# Patient Record
Sex: Male | Born: 1955
Health system: Southern US, Community
[De-identification: ages and names within clinical notes are randomized; demographics above are authoritative.]

## PROBLEM LIST (undated history)

## (undated) DIAGNOSIS — M75100 Unspecified rotator cuff tear or rupture of unspecified shoulder, not specified as traumatic: Secondary | ICD-10-CM

## (undated) DIAGNOSIS — D500 Iron deficiency anemia secondary to blood loss (chronic): Secondary | ICD-10-CM

## (undated) DIAGNOSIS — M419 Scoliosis, unspecified: Secondary | ICD-10-CM

## (undated) DIAGNOSIS — Z9109 Other allergy status, other than to drugs and biological substances: Secondary | ICD-10-CM

## (undated) DIAGNOSIS — K7460 Unspecified cirrhosis of liver: Principal | ICD-10-CM

## (undated) DIAGNOSIS — M1990 Unspecified osteoarthritis, unspecified site: Secondary | ICD-10-CM

## (undated) DIAGNOSIS — R569 Unspecified convulsions: Secondary | ICD-10-CM

## (undated) DIAGNOSIS — I8501 Esophageal varices with bleeding: Secondary | ICD-10-CM

## (undated) DIAGNOSIS — B182 Chronic viral hepatitis C: Principal | ICD-10-CM

## (undated) DIAGNOSIS — K3189 Other diseases of stomach and duodenum: Secondary | ICD-10-CM

## (undated) DIAGNOSIS — B1920 Unspecified viral hepatitis C without hepatic coma: Secondary | ICD-10-CM

## (undated) DIAGNOSIS — R296 Repeated falls: Secondary | ICD-10-CM

## (undated) DIAGNOSIS — G40909 Epilepsy, unspecified, not intractable, without status epilepticus: Secondary | ICD-10-CM

## (undated) DIAGNOSIS — K5790 Diverticulosis of intestine, part unspecified, without perforation or abscess without bleeding: Secondary | ICD-10-CM

## (undated) DIAGNOSIS — S0990XS Unspecified injury of head, sequela: Secondary | ICD-10-CM

## (undated) DIAGNOSIS — K766 Portal hypertension: Secondary | ICD-10-CM

## (undated) DIAGNOSIS — K760 Fatty (change of) liver, not elsewhere classified: Secondary | ICD-10-CM

## (undated) DIAGNOSIS — D126 Benign neoplasm of colon, unspecified: Secondary | ICD-10-CM

## (undated) DIAGNOSIS — W19XXXA Unspecified fall, initial encounter: Secondary | ICD-10-CM

## (undated) DIAGNOSIS — Z9289 Personal history of other medical treatment: Secondary | ICD-10-CM

## (undated) DIAGNOSIS — G44309 Post-traumatic headache, unspecified, not intractable: Secondary | ICD-10-CM

## (undated) HISTORY — DX: Chronic viral hepatitis C: B18.2

## (undated) HISTORY — DX: Unspecified rotator cuff tear or rupture of unspecified shoulder, not specified as traumatic: M75.100

## (undated) HISTORY — PX: FRACTURE SURGERY: SHX138

## (undated) HISTORY — DX: Fatty (change of) liver, not elsewhere classified: K76.0

## (undated) HISTORY — DX: Unspecified viral hepatitis C without hepatic coma: B19.20

## (undated) HISTORY — DX: Scoliosis, unspecified: M41.9

## (undated) HISTORY — DX: Epilepsy, unspecified, not intractable, without status epilepticus: G40.909

## (undated) HISTORY — DX: Unspecified cirrhosis of liver: K74.60

## (undated) HISTORY — DX: Unspecified convulsions: R56.9

## (undated) HISTORY — PX: ABDOMINAL SURGERY: SHX537

## (undated) HISTORY — DX: Diverticulosis of intestine, part unspecified, without perforation or abscess without bleeding: K57.90

## (undated) HISTORY — PX: RADIUS SYNOSTOSIS PROXIMAL RESECTION W/ ALLOGRAFT: SHX2298

---

## 1985-12-21 DIAGNOSIS — Z9289 Personal history of other medical treatment: Secondary | ICD-10-CM

## 1985-12-21 HISTORY — DX: Personal history of other medical treatment: Z92.89

## 1985-12-21 HISTORY — PX: ORIF ZYGOMATIC FRACTURE: SHX2134

## 1999-09-03 ENCOUNTER — Ambulatory Visit (HOSPITAL_BASED_OUTPATIENT_CLINIC_OR_DEPARTMENT_OTHER): Admission: RE | Admit: 1999-09-03 | Discharge: 1999-09-03 | Payer: Self-pay | Admitting: Orthopedic Surgery

## 2001-03-29 ENCOUNTER — Emergency Department (HOSPITAL_COMMUNITY): Admission: EM | Admit: 2001-03-29 | Discharge: 2001-03-29 | Payer: Self-pay | Admitting: Emergency Medicine

## 2001-03-29 ENCOUNTER — Encounter: Payer: Self-pay | Admitting: Emergency Medicine

## 2006-12-10 ENCOUNTER — Ambulatory Visit: Payer: Self-pay | Admitting: Internal Medicine

## 2006-12-21 DIAGNOSIS — K5790 Diverticulosis of intestine, part unspecified, without perforation or abscess without bleeding: Secondary | ICD-10-CM

## 2006-12-21 HISTORY — PX: COLONOSCOPY: SHX174

## 2006-12-21 HISTORY — DX: Diverticulosis of intestine, part unspecified, without perforation or abscess without bleeding: K57.90

## 2006-12-29 ENCOUNTER — Ambulatory Visit: Payer: Self-pay | Admitting: Internal Medicine

## 2007-01-07 ENCOUNTER — Ambulatory Visit: Payer: Self-pay | Admitting: Internal Medicine

## 2007-02-18 ENCOUNTER — Ambulatory Visit: Payer: Self-pay | Admitting: Internal Medicine

## 2007-02-28 ENCOUNTER — Ambulatory Visit: Payer: Self-pay | Admitting: Internal Medicine

## 2009-12-21 DIAGNOSIS — K760 Fatty (change of) liver, not elsewhere classified: Secondary | ICD-10-CM

## 2009-12-21 HISTORY — DX: Fatty (change of) liver, not elsewhere classified: K76.0

## 2010-06-04 ENCOUNTER — Encounter: Payer: Self-pay | Admitting: Internal Medicine

## 2010-06-04 ENCOUNTER — Encounter: Admission: RE | Admit: 2010-06-04 | Discharge: 2010-06-04 | Payer: Self-pay | Admitting: Internal Medicine

## 2010-06-26 ENCOUNTER — Other Ambulatory Visit: Admission: RE | Admit: 2010-06-26 | Discharge: 2010-06-26 | Payer: Self-pay | Admitting: Otolaryngology

## 2010-07-16 ENCOUNTER — Ambulatory Visit (HOSPITAL_COMMUNITY): Admission: RE | Admit: 2010-07-16 | Discharge: 2010-07-16 | Payer: Self-pay | Admitting: Otolaryngology

## 2010-07-28 ENCOUNTER — Encounter (INDEPENDENT_AMBULATORY_CARE_PROVIDER_SITE_OTHER): Payer: Self-pay | Admitting: *Deleted

## 2010-08-28 ENCOUNTER — Ambulatory Visit: Payer: Self-pay | Admitting: Gastroenterology

## 2010-10-03 ENCOUNTER — Ambulatory Visit (HOSPITAL_COMMUNITY)
Admission: RE | Admit: 2010-10-03 | Discharge: 2010-10-03 | Payer: Self-pay | Source: Home / Self Care | Admitting: Gastroenterology

## 2010-10-17 ENCOUNTER — Ambulatory Visit (HOSPITAL_COMMUNITY)
Admission: RE | Admit: 2010-10-17 | Discharge: 2010-10-18 | Payer: Self-pay | Source: Home / Self Care | Admitting: Otolaryngology

## 2010-10-17 ENCOUNTER — Encounter (INDEPENDENT_AMBULATORY_CARE_PROVIDER_SITE_OTHER): Payer: Self-pay | Admitting: Otolaryngology

## 2010-10-23 ENCOUNTER — Ambulatory Visit: Payer: Self-pay | Admitting: Gastroenterology

## 2010-11-27 ENCOUNTER — Ambulatory Visit: Payer: Self-pay | Admitting: Gastroenterology

## 2010-12-21 HISTORY — PX: THYROIDECTOMY, PARTIAL: SHX18

## 2011-01-20 NOTE — Procedures (Signed)
Summary: Colonoscopy: Diverticulosis   Colonoscopy  Procedure date:  02/28/2007  Findings:      Results: Diverticulosis. Location:  [facility].   Comments: 1) NO POLYPS OR CANCER SEEN 2) SIGMOID DIVERTICULOSIS 3) OTHERWISE OK 4) 8 MIN 22 SEC WITHDRAWAL 5) EXCELLENT PREP  Comments:      Repeat colonoscopy in 10 years.    Procedures Next Due Date:    Colonoscopy: 02/2017 [facility] Reports-CCC] Patient Name: Francisco [name], Francisco [name]. MRN:  Procedure Procedures: Colonoscopy CPT: [phone].  Personnel: Endoscopist: Iva Boop, MD, [facility].  Referred By: Jonita Albee, MD.  Exam Location: Exam performed in Outpatient Clinic. Outpatient  Patient Consent: Procedure, Alternatives, Risks and Benefits discussed, consent obtained, from patient. Consent was obtained by the RN.  Indications  Average Risk Screening Routine.  History  Current Medications: Patient is not currently taking Coumadin.  Allergies: No known allergies.  Pre-Exam Physical: Performed Feb 28, 2007. Cardio-pulmonary exam, Rectal exam, HEENT exam , Abdominal exam, Mental status exam WNL.  Comments: Pt. history reviewed/updated, physical exam performed prior to initiation of sedation? YES Exam Exam: Extent of exam reached: Cecum, extent intended: Cecum.  The cecum was identified by appendiceal orifice and IC valve. Patient position: on left side. Time to Cecum: 00:02:12. Time for Withdrawl: 00:08:22. Colon retroflexion performed. Images taken. ASA Classification: I. Tolerance: excellent.  Monitoring: Pulse and BP monitoring, Oximetry used. Supplemental O2 given.  Colon Prep Used MiraLax for colon prep. Prep results: excellent.  Sedation Meds: Patient assessed and found to be appropriate for moderate (conscious) sedation. Fentanyl 50 mcg. given IV. Versed 6 mg. given IV.  Findings - DIVERTICULOSIS: Sigmoid Colon. ICD9: Diverticulosis, Colon: 562.10.  - NORMAL EXAM: Cecum to Descending  Colon.  NORMAL EXAM: Rectum.   Assessment  Diagnoses: 562.10: Diverticulosis, Colon.   Comments: 1) NO POLYPS OR CANCER SEEN 2) SIGMOID DIVERTICULOSIS 3) OTHERWISE OK 4) 8 MIN 22 SEC WITHDRAWAL 5) EXCELLENT PREP Events  Unplanned Interventions: No intervention was required.  Plans Patient Education: Patient given standard instructions for: Diverticulosis. Patient instructed to get routine colonoscopy every 10 years.  Disposition: After procedure patient sent to recovery. After recovery patient sent home.  Scheduling/Referral: Follow-Up prn.   Appended Document: Colonoscopy: Diverticulosis CC:   Robert Bellow, MD  This report was created from the original endoscopy report, which was reviewed and signed by the above listed endoscopist.

## 2011-01-20 NOTE — Letter (Signed)
Summary: New Patient letter  [facility]  [address] [place], [place] [address]   Phone: [phone]  Fax: [phone]       07/28/2010 MRN: [id number]  [facility] [address] [place], [place]  [address]  Dear Francisco [name],  Welcome to the Gastroenterology Division at [facility].    You are scheduled to see Dr.  Leone Payor on 09-17-10 at 10:00a.m. on the 3rd floor at [facility], [address]  We ask that you try to arrive at our office 15 minutes prior to your appointment time to allow for check-in.  We would like you to complete the enclosed self-administered evaluation form prior to your visit and bring it with you on the day of your appointment.  We will review it with you.  Also, please bring a complete list of all your medications or, if you prefer, bring the medication bottles and we will list them.  Please bring your insurance card so that we may make a copy of it.  If your insurance requires a referral to see a specialist, please bring your referral form from your primary care physician.  Co-payments are due at the time of your visit and may be paid by cash, check or credit card.     Your office visit will consist of a consult with your physician (includes a physical exam), any laboratory testing he/she may order, scheduling of any necessary diagnostic testing (e.g. x-ray, ultrasound, CT-scan), and scheduling of a procedure (e.g. Endoscopy, Colonoscopy) if required.  Please allow enough time on your schedule to allow for any/all of these possibilities.    If you cannot keep your appointment, please call [phone] to cancel or reschedule prior to your appointment date.  This allows [name] the opportunity to schedule an appointment for another patient in need of care.  If you do not cancel or reschedule by 5 p.m. the business day prior to your appointment date, you will be charged a $50.00 late cancellation/no-show fee.    Thank you for  choosing [facility] for your medical needs.  We appreciate the opportunity to care for you.  Please visit [name] at our website  to learn more about our practice.                     Sincerely,                                                             The Gastroenterology Division

## 2011-03-04 LAB — PROTIME-INR: INR: 0.96 (ref 0.00–1.49)

## 2011-03-04 LAB — CBC
Platelets: 107 10*3/uL — ABNORMAL LOW (ref 150–400)
RBC: 4.84 MIL/uL (ref 4.22–5.81)
RDW: 12.6 % (ref 11.5–15.5)
WBC: 6.5 10*3/uL (ref 4.0–10.5)

## 2011-03-04 LAB — HEPATIC FUNCTION PANEL
ALT: 207 U/L — ABNORMAL HIGH (ref 0–53)
AST: 159 U/L — ABNORMAL HIGH (ref 0–37)
Albumin: 3.7 g/dL (ref 3.5–5.2)
Bilirubin, Direct: 0.2 mg/dL (ref 0.0–0.3)
Total Protein: 7.4 g/dL (ref 6.0–8.3)

## 2011-03-04 LAB — BASIC METABOLIC PANEL
BUN: 11 mg/dL (ref 6–23)
CO2: 25 mEq/L (ref 19–32)
Calcium: 10 mg/dL (ref 8.4–10.5)
Chloride: 107 mEq/L (ref 96–112)
Creatinine, Ser: 0.74 mg/dL (ref 0.4–1.5)
GFR calc Af Amer: >60 mL/min (ref >60)
GFR calc non Af Amer: >60 mL/min (ref >60)
Glucose, Bld: 95 mg/dL (ref 70–99)
Potassium: 4.2 mEq/L (ref 3.5–5.1)
Sodium: 137 mEq/L (ref 135–145)

## 2011-03-04 LAB — APTT: aPTT: 31 seconds (ref 24–37)

## 2011-03-04 LAB — SURGICAL PCR SCREEN: Staphylococcus aureus: NEGATIVE

## 2011-03-05 LAB — CBC
MCV: 96.7 fL (ref 78.0–100.0)
Platelets: 107 10*3/uL — ABNORMAL LOW (ref 150–400)
RBC: 4.52 MIL/uL (ref 4.22–5.81)
RDW: 12.4 % (ref 11.5–15.5)
WBC: 5.1 10*3/uL (ref 4.0–10.5)

## 2011-03-05 LAB — PROTIME-INR: Prothrombin Time: 13.4 seconds (ref 11.6–15.2)

## 2011-03-05 LAB — APTT: aPTT: 32 seconds (ref 24–37)

## 2011-03-07 LAB — CBC
HCT: 44.5 % (ref 39.0–52.0)
MCHC: 34.9 g/dL (ref 30.0–36.0)
MCHC: 35.3 g/dL (ref 30.0–36.0)
MCV: 98.3 fL (ref 78.0–100.0)
Platelets: 128 10*3/uL — ABNORMAL LOW (ref 150–400)
RDW: 12.6 % (ref 11.5–15.5)
RDW: 12.6 % (ref 11.5–15.5)
WBC: 6.6 10*3/uL (ref 4.0–10.5)

## 2011-03-07 LAB — HEPATIC FUNCTION PANEL
Albumin: 3.5 g/dL (ref 3.5–5.2)
Bilirubin, Direct: 0.3 mg/dL (ref 0.0–0.3)
Total Bilirubin: 0.7 mg/dL (ref 0.3–1.2)

## 2011-03-07 LAB — PROTIME-INR
INR: 1.01 (ref 0.00–1.49)
Prothrombin Time: 13.2 seconds (ref 11.6–15.2)

## 2011-03-07 LAB — SURGICAL PCR SCREEN
MRSA, PCR: NEGATIVE
Staphylococcus aureus: NEGATIVE

## 2011-05-08 NOTE — Assessment & Plan Note (Signed)
[facility]                             PULMONARY OFFICE NOTE   NAME:[name], [name]                   MRN:          [id number]  DATE:01/07/2007                            DOB:          [birth year]    PROBLEMS:  1. Episodic urticaria.  2. Allergic asthma.  3. Tobacco use.   HISTORY:  He has cut cigarette smoking down to about 3 cigarettes a day.  There have been no acute exacerbations as he returns today for allergy  testing.   MEDICATIONS:  No routine medications. He has albuterol and Atrovent used  rarely as rescue inhalers.   OBJECTIVE:  VITAL SIGNS: Weight 224 pounds, blood pressure 116/70, pulse  regular 84, room air saturation 94%.  GENERAL: He appears comfortable, but there is mild, bilateral expiratory  wheeze. Breathing is unlabored.  HEART: Sounds are regular without murmur.  There is no neck vein distension, adenopathy, or edema.   Spirometry before and after bronchodilator on January 9, showed mild  obstructive airways disease, primarily in small airway flows which were  between 27-37% of predicted with a 37% improvement after bronchodilator.  Chest x-ray on December 21 had shown chronic obstructive pulmonary  disease and probable chronic bronchitic changes with no active lung  disease. His measured immunoglobulin E-level was markedly elevated at  681.8. His CBC was remarkable for an elevated eosinophil count at 9.4%.   SKIN TESTING:  Positive histamine, negative diluent control. Significant  positive reactions primarily for [place] grass, several fall weeds, some  trees, house dust, and dust mite, and also to cat. As of recently, he no  longer has a cat.   IMPRESSION:  1. Chronic asthma with a significant allergic component.  2. History of urticaria.   PLAN:  1. Smoking cessation was re-emphasized and encouraged.  2. Environmental precautions, house dust management, use of air      cleaners and encasings, were all  reviewed. For the rest of this      winter he is going to using Claritin p.r.n., along with p.r.n. use      of his inhalers. I am going to see him back in the spring, in about      4 months, because I want to see what effect the season change and      spring pollens have on him. We      have begun the discussion of the options of allergy vaccine      management and the likelihood that he is going to need a steroid      inhaler. He will contact us earlier p.r.n.     Clinton D. Maple Hudson, MD, Ton[facility] Bollman, FACP  Electronically Signed    CDY/MedQ  DD: 01/07/2007  DT: 01/07/2007  Job #: [id number]   cc:   Rodolph Bong, M.D.

## 2011-05-08 NOTE — Assessment & Plan Note (Signed)
[facility]                             PULMONARY OFFICE NOTE   NAME:[name], [name]                   MRN:          [id number]  DATE:12/10/2006                            DOB:          1956/06/24    PROBLEM:  This 55 year old man was referred through the courtesy of Rodolph Bong, M.D. at [facility] for complaints of  trouble breathing.   HISTORY:  He has been aware of shortness of breath since mid-November of  this fall with three separate episodes in the past 8 months.  He first  began noticing problems about a year ago when he held his daughter's  [place] pig.  His eyes began to water.  On another occasion, his lips  swelled, he got a rash on his hands, and his breathing felt tight.  These episodes have come and gone.  [facility] and  November.  He has had at least three episodes of angioedema of the lip.  He was treated with albuterol and Atrovent as inhalers and by  description probably got some antihistamines as well, although he is not  clear about his medicines.  He does not distinguish events and  circumstances very clearly, but it does not sound as if each of these  episodes was associated with direct contact with the [place] pig which is  in a study in their home where the family computer is kept.  He has not  had a history of asthma, but as a small child he remembers that he was  told he was allergic to a feather pillow.   MEDICATIONS:  Albuterol and Atrovent inhalers.   ALLERGIES:  No known drug allergies.   REVIEW OF SYSTEMS:  Shortness of breath at rest, productive cough,  sometimes dry cough.  Otherwise as per HPI.  He denies adenopathy,  sustained wheezing or coughing, reflux, nausea or vomiting.   PAST MEDICAL HISTORY:  Good general health.  Surgical reconstruction of  his right zygomatic arch and of a fracture of the right forearm.  No  history of intolerance to latex, IV contrast,  iodine, or aspirin.   SOCIAL HISTORY:  He smokes 1/2 pack per day and drinks two alcoholic  beverages per day.  He is divorced with 83 year old daughter who lives  with him.  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  In  his current home, they did have a cat and dog, each of which recently  died of old age.  He is not recognizing a significant mold or mildew  problem or obvious aggravating factors in his workplace or home.   FAMILY HISTORY:  Both parents are living.  Brother has pollen allergy  problems, died in a motor vehicle accident.   OBJECTIVE:  VITAL SIGNS:  Weight 225 pounds, blood pressure 118/82,  pulse regular 69, room air saturation 97%.  SKIN:  Clear.  HEENT:  Conjunctivae are  not injected.  There is some wet mucous in both  nares without obstruction.  His pharynx is clear.  Neck veins are not  distended.  There is no stridor.  HEART:  Sounds are regular without murmur or gallop.  CHEST:  Mildly congested, evident only when he coughed.  There was no  wheeze or abnormal noise with quiet breathing.  No dullness.  EXTREMITIES:  Without cyanosis, clubbing, or edema.   IMPRESSION:  1. Episodic urticaria.  2. Allergic asthma.  3. Tobacco use.   PLAN:  1. He has Chantix which he had not yet started.  He is encouraged to      do so.  2. I suggested he try boarding the [place] pig for a while to get it      out of the house for a month or so.  3. We are scheduling pulmonary function tests.  4. Chest x-ray.  5. CBC with differential and IgE level.  6. Schedule return for allergy testing.  7. We discussed p.r.n. use of antihistamines and albuterol.   I appreciate the chance to meet him.     Clinton D. Maple Hudson, MD, Tonny Bollman, FACP  Electronically Signed    CDY/MedQ  DD: 12/15/2006  DT: 12/15/2006  Job #: [id number]   cc:    Rodolph Bong, M.D.

## 2013-01-03 ENCOUNTER — Ambulatory Visit (INDEPENDENT_AMBULATORY_CARE_PROVIDER_SITE_OTHER): Payer: BC Managed Care – PPO | Admitting: Physician Assistant

## 2013-01-03 VITALS — BP 130/84 | HR 121 | Temp 98.2°F | Resp 18 | Ht 72.0 in | Wt 239.0 lb

## 2013-01-03 DIAGNOSIS — L509 Urticaria, unspecified: Secondary | ICD-10-CM

## 2013-01-03 MED ORDER — HYDROXYZINE HCL 25 MG PO TABS: 12.5000 mg | ORAL_TABLET | Freq: Three times a day (TID) | ORAL | Status: DC | PRN

## 2013-01-03 MED ORDER — METHYLPREDNISOLONE ACETATE 80 MG/ML IJ SUSP
80.0000 mg | Freq: Once | INTRAMUSCULAR | Status: AC
  Administered 2013-01-03: 80 mg via INTRAMUSCULAR

## 2013-01-03 NOTE — Progress Notes (Signed)
Subjective:    Patient ID: Francisco [name], male    DOB: 23-Feb-1956, 57 y.o.   MRN: [id number]  HPI   Francisco [name] is a 57 yr old male here with concern for a food allergy.  States he ate at a [employer] yesterday for dinner.  About 10 minutes after leaving the restaurant he developed a rash on his arm.  Rash and hives subsequently developed all over his body - predominantly across buttocks, abdomen, arms.  The hives are very itchy.  No fever or chills.  No GI symptoms.  Denies respiratory symptoms, itchy mouth, swelling lips or tongue.  Does state that his top lip has felt a little numb but that this is resolving.  He has never had a rash like this before.  He was eating by himself, no other cross contamination that he is aware of.  Pt states he ate a burrito with chicken, pico de gallo, beans, and onions.  He has eaten this before without issue.  He also had a margarita with his meal.  No other new exposures yesterday.  Had a reuben sandwich and fries for lunch, but also has eaten this without issue before.  No food allergies that he is aware of.  His only known allergy is to [place] pigs - had reaction to daughter's pet [place] pig.  States the thought did cross his mind that perhaps he was not eating chicken in his burrito.  States he tolerates common food allergens like nuts and shellfish.  Used some old cortisone cream topically last night which allowed him to get some sleep.  Has not used an antihistamine yet.   Review of Systems  Constitutional: Negative for fever and chills.  HENT: Negative for facial swelling.   Eyes: Negative.   Respiratory: Negative for cough, chest tightness, shortness of breath and wheezing.   Cardiovascular: Negative.   Gastrointestinal: Negative for nausea, vomiting, abdominal pain and diarrhea.  Musculoskeletal: Negative.   Skin: Positive for rash.  Neurological: Negative.        Objective:   Physical Exam  Vitals reviewed. Constitutional: He  is oriented to person, place, and time. He appears well-developed and well-nourished. No distress.  HENT:  Head: Normocephalic and atraumatic.  Eyes: Conjunctivae normal are normal. No scleral icterus.  Cardiovascular: Normal rate, regular rhythm, normal heart sounds and intact distal pulses.  Exam reveals no gallop and no friction rub.   No murmur heard. Pulmonary/Chest: Effort normal and breath sounds normal. He has no wheezes. He has no rales.  Abdominal: Soft. Bowel sounds are normal. There is no tenderness.  Neurological: He is alert and oriented to person, place, and time.  Skin: Skin is warm and dry. Rash noted. Rash is urticarial. Rash is not pustular and not vesicular.          Diffuse urticarial rash with most predominant areas across buttocks  Psychiatric: He has a normal mood and affect. His behavior is normal.     Filed Vitals:   01/03/13 0849  BP: 130/84  Pulse: 121  Temp: 98.2 F (36.8 C)  Resp: 18   On exam pulse regular at approx 80 bpm     Assessment & Plan:  . 1. Urticaria  methylPREDNISolone acetate (DEPO-MEDROL) injection 80 mg, hydrOXYzine (ATARAX/VISTARIL) 25 MG tablet, Ambulatory referral to Allergy     [name] is a very pleasant 57 yr old male with an urticarial eruption, suspicious for food allergy given temporal relationship to a meal.  Unclear what  the triggering exposure was.  There are no new exposures that the patient is aware of.  He has no history of food allergy.  Pt was initially resistant to the idea of seeing an allergist, but I discussed with him that it is important for [name] to know what caused this reaction.  I expressed concern that a subsequent encounter with the allergen could result in a more severe reaction . Pt eventually agreed that allergy evaluation may be beneficial, so I have placed this referral.  Pt given IM depomedrol in clinic.  Also given 10mg  loratadine.  Encouraged pt to use OTC cetirizine during the day for itching and  hydroxyzine at night.  Discussed RTC precautions.  Pt understands and will let is know if he is worsening or not improving.

## 2013-01-03 NOTE — Patient Instructions (Addendum)
We have given you a shot of steroids today, this will help reduce the itching over the next several days.  Take Zyrtec (cetirizine) once daily for itching.  Take hydroxyzine at night if needed for itching - this medicine will make you sleepy!  I have put in a referral to allergy so that we can hopefully pinpoint what you are allergy to so you can avoid this in the future.   Food Allergy A food allergy occurs from eating something you are sensitive to. Food allergies occur in all age groups. It may be passed to you from your parents (heredity).  CAUSES  Some common causes are cow's milk, seafood, eggs, nuts (including peanut butter), wheat, and soybeans. SYMPTOMS  Common problems are:   Swelling around the mouth.  An itchy, red rash.  Hives.  Vomiting.  Diarrhea. Severe allergic reactions are life-threatening. This reaction is called anaphylaxis. It can cause the mouth and throat to swell. This makes it hard to breathe and swallow. In severe reactions, only a small amount of food may be fatal within seconds. HOME CARE INSTRUCTIONS   If you are unsure what caused the reaction, keep a diary of foods eaten and symptoms that followed. Avoid foods that cause reactions.  If hives or rash are present:  Take medicines as directed.  Use an over-the-counter antihistamine (diphenhydramine) to treat hives and itching as needed.  Apply cold compresses to the skin or take baths in cool water. Avoid hot baths or showers. These will increase the redness and itching.  If you are severely allergic:  Hospitalization is often required following a severe reaction.  Wear a medical alert bracelet or necklace that describes the allergy.  Carry your anaphylaxis kit or epinephrine injection with you at all times. Both you and your family members should know how to use this. This can be lifesaving if you have a severe reaction. If epinephrine is used, it is important for you to seek immediate medical care  or call your local emergency services (911 in [place] When the epinephrine wears off, it can be followed by a delayed reaction, which can be fatal.  Replace your epinephrine immediately after use in case of another reaction.  Ask your caregiver for instructions if you have not been taught how to use an epinephrine injection.  Do not drive until medicines used to treat the reaction have worn off, unless approved by your caregiver. SEEK MEDICAL CARE IF:   You suspect a food allergy. Symptoms generally happen within 30 minutes of eating a food.  Your symptoms have not gone away within 2 days. See your caregiver sooner if symptoms are getting worse.  You develop new symptoms.  You want to retest yourself with a food or drink you think causes an allergic reaction. Never do this if an anaphylactic reaction to that food or drink has happened before.  There is a return of the symptoms which brought you to your caregiver. SEEK IMMEDIATE MEDICAL CARE IF:   You have trouble breathing, are wheezing, or you have a tight feeling in your chest or throat.  You have a swollen mouth, or you have hives, swelling, or itching all over your body. Use your epinephrine injection immediately. This is given into the outside of your thigh, deep into the muscle. Following use of the epinephrine injection, seek help right away. Seek immediate medical care or call your local emergency services (911 in [place] MAKE SURE YOU:   Understand these instructions.  Will watch your  condition.  Will get help right away if you are not doing well or get worse. Document Released: 12/04/2000 Document Revised: 02/29/2012 Document Reviewed: 07/26/2008 [facility] Patient Information 2013 [place], [place].

## 2014-05-08 ENCOUNTER — Encounter: Payer: Self-pay | Admitting: Internal Medicine

## 2014-05-08 ENCOUNTER — Ambulatory Visit (INDEPENDENT_AMBULATORY_CARE_PROVIDER_SITE_OTHER): Payer: BC Managed Care – PPO | Admitting: Emergency Medicine

## 2014-05-08 VITALS — BP 118/75 | HR 77 | Temp 97.9°F | Resp 18 | Ht 72.0 in | Wt 229.2 lb

## 2014-05-08 DIAGNOSIS — K625 Hemorrhage of anus and rectum: Secondary | ICD-10-CM

## 2014-05-08 LAB — POCT CBC
Granulocyte percent: 56.4 %G (ref 37–80)
HCT, POC: 36 % — AB (ref 43.5–53.7)
Hemoglobin: 11.6 g/dL — AB (ref 14.1–18.1)
LYMPH, POC: 1.4 (ref 0.6–3.4)
MCH: 33 pg — AB (ref 27–31.2)
MCHC: 32.2 g/dL (ref 31.8–35.4)
MCV: 102.7 fL — AB (ref 80–97)
MID (CBC): 0.2 (ref 0–0.9)
MPV: 8.1 fL (ref 0–99.8)
PLATELET COUNT, POC: 0.74 10*3/uL — AB (ref 142–424)
POC Granulocyte: 2.1 (ref 2–6.9)
POC LYMPH %: 37.7 % (ref 10–50)
POC MID %: 5.9 % (ref 0–12)
RBC: 3.51 M/uL — AB (ref 4.69–6.13)
RDW, POC: 14.7 %
WBC: 3.7 10*3/uL — AB (ref 4.6–10.2)

## 2014-05-08 LAB — IFOBT (OCCULT BLOOD): IFOBT: POSITIVE

## 2014-05-08 MED ORDER — ESOMEPRAZOLE MAGNESIUM 40 MG PO CPDR: 40.0000 mg | DELAYED_RELEASE_CAPSULE | Freq: Every day | ORAL | Status: DC

## 2014-05-08 MED ORDER — SUCRALFATE 1 G PO TABS: ORAL_TABLET | ORAL | Status: DC

## 2014-05-08 NOTE — Patient Instructions (Signed)
Gastrointestinal Bleeding °Gastrointestinal (GI) bleeding means there is bleeding somewhere along the digestive tract, between the mouth and anus. °CAUSES  °There are many different problems that can cause GI bleeding. Possible causes include: °· Esophagitis. This is inflammation, irritation, or swelling of the esophagus. °· Hemorrhoids. These are veins that are full of blood (engorged) in the rectum. They cause pain, inflammation, and may bleed. °· Anal fissures. These are areas of painful tearing which may bleed. They are often caused by passing hard stool. °· Diverticulosis. These are pouches that form on the colon over time, with age, and may bleed significantly. °· Diverticulitis. This is inflammation in areas with diverticulosis. It can cause pain, fever, and bloody stools, although bleeding is rare. °· Polyps and cancer. Colon cancer often starts out as precancerous polyps. °· Gastritis and ulcers. Bleeding from the upper gastrointestinal tract (near the stomach) may travel through the intestines and produce black, sometimes tarry, often bad smelling stools. In certain cases, if the bleeding is fast enough, the stools may not be black, but red. This condition may be life-threatening. °SYMPTOMS  °· Vomiting bright red blood or material that looks like coffee grounds. °· Bloody, black, or tarry stools. °DIAGNOSIS  °Your caregiver may diagnose your condition by taking your history and performing a physical exam. More tests may be needed, including: °· X-rays and other imaging tests. °· Esophagogastroduodenoscopy (EGD). This test uses a flexible, lighted tube to look at your esophagus, stomach, and small intestine. °· Colonoscopy. This test uses a flexible, lighted tube to look at your colon. °TREATMENT  °Treatment depends on the cause of your bleeding.  °· For bleeding from the esophagus, stomach, small intestine, or colon, the caregiver doing your EGD or colonoscopy may be able to stop the bleeding as part of  the procedure. °· Inflammation or infection of the colon can be treated with medicines. °· Many rectal problems can be treated with creams, suppositories, or warm baths. °· Surgery is sometimes needed. °· Blood transfusions are sometimes needed if you have lost a lot of blood. °If bleeding is slow, you may be allowed to go home. If there is a lot of bleeding, you will need to stay in the hospital for observation. °HOME CARE INSTRUCTIONS  °· Take any medicines exactly as prescribed. °· Keep your stools soft by eating foods that are high in fiber. These foods include whole grains, legumes, fruits, and vegetables. Prunes (1 to 3 a day) work well for many people. °· Drink enough fluids to keep your urine clear or pale yellow. °SEEK IMMEDIATE MEDICAL CARE IF:  °· Your bleeding increases. °· You feel lightheaded, weak, or you faint. °· You have severe cramps in your back or abdomen. °· You pass large blood clots in your stool. °· Your problems are getting worse. °MAKE SURE YOU:  °· Understand these instructions. °· Will watch your condition. °· Will get help right away if you are not doing well or get worse. °Document Released: 12/04/2000 Document Revised: 11/23/2012 Document Reviewed: 11/16/2011 °[employer]® Patient Information ©2014 [employer]. ° °

## 2014-05-08 NOTE — Progress Notes (Signed)
[facility] [address], [place] [address] [phone]  Date:  05/08/2014   Name:  Francisco [name]   DOB:  1956-08-24   MRN:  [id number]  PCP:  No PCP Per Patient    Chief Complaint: Rectal Bleeding   History of Present Illness:  Francisco [name] is a 58 y.o. very pleasant male patient who presents with the following:  Says has experienced black stools intermittently.  Initially a year ago and more recently since Saturday.  No heartburn or indigestion.  Feels as though he has to belch and is forced to get up at night and walk around to belch. No excess alcohol, caffeine, or NSAID or ASA.  No history of PUD of gastritis.  Smokes 1/2 ppd.  No abdominal pain.  History of transaminitis and "normal" liver biopsy per patient.  Underwent a colonoscopy at age 27 which was clean.  No nausea or vomiting.  No hematemesis.   No BRBPR.   No improvement with over the counter medications or other home remedies. Denies other complaint or health concern today.   There are no active problems to display for this patient.   Past Medical History  Diagnosis Date  . Allergy     Past Surgical History  Procedure Laterality Date  . Fracture surgery    . Thyroidectomy  2012    History  Substance Use Topics  . Smoking status: Current Every Day Smoker -- 0.50 packs/day    Types: Cigarettes    Start date: 05/08/1974  . Smokeless tobacco: Not on file  . Alcohol Use: 6.0 oz/week    10 Cans of beer per week    No family history on file.  No Known Allergies  Medication list has been reviewed and updated.  No current outpatient prescriptions on file prior to visit.   No current facility-administered medications on file prior to visit.    Review of Systems:  As per HPI, otherwise negative.    Physical Examination: Filed Vitals:   05/08/14 1231  BP: 118/75  Pulse: 77  Temp: 97.9 F (36.6 C)  Resp: 18   Filed Vitals:   05/08/14 1231  Height: 6' (1.829  m)  Weight: 229 lb 3.2 oz (103.964 kg)   Body mass index is 31.08 kg/(m^2). Ideal Body Weight: Weight in (lb) to have BMI = 25: 183.9  GEN: WDWN, NAD, Non-toxic, A & O x 3 HEENT: Atraumatic, Normocephalic. Neck supple. No masses, No LAD. Ears and Nose: No external deformity. CV: RRR, No M/G/R. No JVD. No thrill. No extra heart sounds. PULM: CTA B, no wheezes, crackles, rhonchi. No retractions. No resp. distress. No accessory muscle use. ABD: S, epigastric tenderness, umbilical hernia, ND, +BS. No rebound. No HSM. EXTR: No c/c/e NEURO Normal gait.  PSYCH: Normally interactive. Conversant. Not depressed or anxious appearing.  Calm demeanor.  DRE:  Large prostate  Black stool  Assessment and Plan: GI bleed.  Presume PUD vs GERD Hemodynamically stable GI consult nexium carafate  Signed,  Ellison Carwin, MD    Results for orders placed in visit on 05/08/14  POCT CBC      Result Value Ref Range   WBC 3.7 (*) 4.6 - 10.2 K/uL   Lymph, poc 1.4  0.6 - 3.4   POC LYMPH PERCENT 37.7  10 - 50 %L   MID (cbc) 0.2  0 - 0.9   POC MID % 5.9  0 - 12 %M   POC Granulocyte 2.1  2 - 6.9   Granulocyte percent 56.4  37 - 80 %G   RBC 3.51 (*) 4.69 - 6.13 M/uL   Hemoglobin 11.6 (*) 14.1 - 18.1 g/dL   HCT, POC 36.0 (*) 43.5 - 53.7 %   MCV 102.7 (*) 80 - 97 fL   MCH, POC 33.0 (*) 27 - 31.2 pg   MCHC 32.2  31.8 - 35.4 g/dL   RDW, POC 14.7     Platelet Count, POC 0.74 (*) 142 - 424 K/uL   MPV 8.1  0 - 99.8 fL  IFOBT (OCCULT BLOOD)      Result Value Ref Range   IFOBT Positive

## 2014-05-09 LAB — COMPREHENSIVE METABOLIC PANEL
ALBUMIN: 3.5 g/dL (ref 3.5–5.2)
ALK PHOS: 140 U/L — AB (ref 39–117)
ALT: 136 U/L — ABNORMAL HIGH (ref 0–53)
AST: 121 U/L — AB (ref 0–37)
BUN: 12 mg/dL (ref 6–23)
CO2: 23 mEq/L (ref 19–32)
CREATININE: 0.66 mg/dL (ref 0.50–1.35)
Calcium: 9.5 mg/dL (ref 8.4–10.5)
Chloride: 104 mEq/L (ref 96–112)
GLUCOSE: 107 mg/dL — AB (ref 70–99)
Potassium: 3.8 mEq/L (ref 3.5–5.3)
Sodium: 137 mEq/L (ref 135–145)
Total Bilirubin: 1.1 mg/dL (ref 0.2–1.2)
Total Protein: 7.5 g/dL (ref 6.0–8.3)

## 2014-05-09 LAB — H. PYLORI ANTIBODY, IGG: H Pylori IgG: <0.4 {ISR}

## 2014-07-05 ENCOUNTER — Encounter: Payer: Self-pay | Admitting: Gastroenterology

## 2014-07-16 ENCOUNTER — Encounter: Payer: Self-pay | Admitting: Internal Medicine

## 2014-07-16 ENCOUNTER — Ambulatory Visit (INDEPENDENT_AMBULATORY_CARE_PROVIDER_SITE_OTHER): Payer: BC Managed Care – PPO | Admitting: Internal Medicine

## 2014-07-16 ENCOUNTER — Other Ambulatory Visit: Payer: BC Managed Care – PPO

## 2014-07-16 ENCOUNTER — Other Ambulatory Visit (INDEPENDENT_AMBULATORY_CARE_PROVIDER_SITE_OTHER): Payer: BC Managed Care – PPO

## 2014-07-16 VITALS — BP 100/60 | HR 68 | Ht 71.0 in | Wt 238.5 lb

## 2014-07-16 DIAGNOSIS — K921 Melena: Secondary | ICD-10-CM

## 2014-07-16 DIAGNOSIS — K7400 Hepatic fibrosis, unspecified: Secondary | ICD-10-CM

## 2014-07-16 DIAGNOSIS — B182 Chronic viral hepatitis C: Secondary | ICD-10-CM

## 2014-07-16 DIAGNOSIS — K7460 Unspecified cirrhosis of liver: Secondary | ICD-10-CM

## 2014-07-16 DIAGNOSIS — D649 Anemia, unspecified: Secondary | ICD-10-CM

## 2014-07-16 LAB — PROTIME-INR
INR: 1.1 ratio — ABNORMAL HIGH (ref 0.8–1.0)
Prothrombin Time: 12.2 s (ref 9.6–13.1)

## 2014-07-16 LAB — COMPREHENSIVE METABOLIC PANEL
ALT: 50 U/L (ref 0–53)
AST: 50 U/L — AB (ref 0–37)
Albumin: 3.1 g/dL — ABNORMAL LOW (ref 3.5–5.2)
Alkaline Phosphatase: 133 U/L — ABNORMAL HIGH (ref 39–117)
BUN: 10 mg/dL (ref 6–23)
CALCIUM: 9.3 mg/dL (ref 8.4–10.5)
CHLORIDE: 109 meq/L (ref 96–112)
CO2: 27 mEq/L (ref 19–32)
CREATININE: 0.7 mg/dL (ref 0.4–1.5)
GFR: 119.02 mL/min (ref >60.00)
Glucose, Bld: 92 mg/dL (ref 70–99)
Potassium: 4.2 mEq/L (ref 3.5–5.1)
Sodium: 138 mEq/L (ref 135–145)
Total Bilirubin: 0.7 mg/dL (ref 0.2–1.2)
Total Protein: 7.3 g/dL (ref 6.0–8.3)

## 2014-07-16 LAB — CBC WITH DIFFERENTIAL/PLATELET
BASOS ABS: 0 10*3/uL (ref 0.0–0.1)
Basophils Relative: 0.4 % (ref 0.0–3.0)
Eosinophils Absolute: 0 10*3/uL (ref 0.0–0.7)
Eosinophils Relative: 1.4 % (ref 0.0–5.0)
HCT: 26.5 % — ABNORMAL LOW (ref 39.0–52.0)
Lymphocytes Relative: 35.5 % (ref 12.0–46.0)
Lymphs Abs: 1 10*3/uL (ref 0.7–4.0)
MCHC: 33.3 g/dL (ref 30.0–36.0)
MCV: 82.5 fl (ref 78.0–100.0)
Monocytes Absolute: 0.3 10*3/uL (ref 0.1–1.0)
Monocytes Relative: 9.8 % (ref 3.0–12.0)
NEUTROS ABS: 1.5 10*3/uL (ref 1.4–7.7)
Neutrophils Relative %: 52.9 % (ref 43.0–77.0)
Platelets: 111 10*3/uL — ABNORMAL LOW (ref 150.0–400.0)
RBC: 3.21 Mil/uL — AB (ref 4.22–5.81)
RDW: 19.3 % — ABNORMAL HIGH (ref 11.5–15.5)
WBC: 2.9 10*3/uL — AB (ref 4.0–10.5)

## 2014-07-16 LAB — VITAMIN B12: Vitamin B-12: 324 pg/mL (ref 211–911)

## 2014-07-16 LAB — FERRITIN: Ferritin: 12 ng/mL — ABNORMAL LOW (ref 22.0–322.0)

## 2014-07-16 NOTE — Progress Notes (Signed)
Subjective:    Patient ID: Francisco [name], male    DOB: 24-Sep-1956, 58 y.o.   MRN: [id number]  HPI  The patient is here to request of Dr. Annye Asa for evaluation of 1. He was seen in late may have a hemoglobin of 11.6 which is down from a normal hemoglobin a couple of years ago. He had an episode of melena this had once since. One time he vomited dark material. He has not felt unwell otherwise though is complaining of some peripheral edema. He was on some Carafate and is now on Nexium. He has a history of hepatitis C with a liver biopsy showing fibrosis in 2011, he saw the [facility] hepatologist but it does not sound like treatment was offered and I don't really have those records. He believes his problem was from fatty liver. He has just returned from a nice trip to the [facility] with his adult daughter. His GI review of systems is otherwise negative at this time  No Known Allergies Outpatient Prescriptions Prior to Visit  Medication Sig Dispense Refill  . esomeprazole (NEXIUM) 40 MG capsule Take 1 capsule (40 mg total) by mouth daily.  30 capsule  3  . sucralfate (CARAFATE) 1 G tablet 1 tab 1 h ac and hs  120 tablet  0   No facility-administered medications prior to visit.   Past Medical History  Diagnosis Date  . Allergy   . Fatty liver 2011    Abd [name]  . Diverticulosis 2008  . Hepatitis C 10/03/2010    surgical path  . Liver fibrosis 10/03/2010    surgical path   Past Surgical History  Procedure Laterality Date  . Orif zygomatic fracture Right   . Thyroidectomy  2012  . Colonoscopy  2008  . Radius synostosis proximal resection w/ allograft      at the elbow   History   Social History  . Marital Status: Divorced    Spouse Name: N/A    Number of Children: 1  . Years of Education: N/A   Occupational History  . operations officer     [employer]   Social History Main Topics  . Smoking status: Current Every Day Smoker -- 0.50 packs/day    Types:  Cigarettes    Start date: 05/08/1974  . Smokeless tobacco: Never Used  . Alcohol Use: 6.0 oz/week    10 Cans of beer per week  . Drug Use: No  . Sexual Activity: None   Other Topics Concern  . 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 He has about 4 beers per day and 2 caffeinated beverages per day.   Family History  Problem Relation Age of Onset  . Crohn's disease Father   . Heart disease Maternal Grandmother   . Alcohol abuse Maternal Grandfather   . Alcohol abuse Maternal Uncle     several  . Cancer Paternal Grandfather     type unknown, mets       Review of Systems Positive for anxiety fatigue insomnia and pedal edema. All other review of systems are negative.    Objective:   Physical Exam General:  Well-developed, well-nourished and in no acute distress Eyes:  anicteric. ENT:   Mouth and posterior pharynx free of lesions.  Neck:   supple w/o thyromegaly or mass.  Lungs: Clear to auscultation bilaterally. Heart:  S1S2, no rubs, murmurs, gallops. Abdomen:  soft, non-tender, no plenomegaly, positive small reducible umbilical hernia, or mass and BS+. I think the left lobe of the liver is palpable near the epigastrium Rectal: Deferred Lymph:  no cervical or supraclavicular adenopathy. Extremities:   1+ pretibial, mid leg edema bilateral, brown hyperpigmentation Skin  there is a rash in the intertriginous area of the lower abdomen, some brown hyperpigmentation and red maculopapular . He does have some spider angiomata on the upper back and upper chest. Neuro:  A&O x 3.  Psych:  appropriate mood and  Affect.   Data Reviewed: Colonoscopy from 2008 is reviewed, may 2015 primary care note labs reviewed in the EMR       Assessment & Plan:   1. Melena   2. Anemia, unspecified anemia type   3. Liver fibrosis   4. Chronic hepatitis C without hepatic  coma      He's had melena as I FOBT was also positive. He has thrombocytopenia, history of liver fibrosis and hepatitis C, spider angiomata. I suspect he may have cirrhosis of the liver and portal hypertension at this point. He needs an upper GI endoscopy and a colonoscopy to investigate his problems. We'll also check a metabolic panel, CBC, coags and repeat hepatitis C RNA. Further plans pending this.  Also uses alcohol regularly about 4 beers a day. This is probably part of his liver problem as well. Await other testing, suspect she will need to become abstinent. The risks and benefits as well as alternatives of endoscopic procedure(s) have been discussed and reviewed. All questions answered. The patient agrees to proceed.

## 2014-07-16 NOTE — Patient Instructions (Addendum)
You have been scheduled for an endoscopy(colon & EGD) at [facility] Please follow written instructions given to you at your visit today. If you use inhalers (even only as needed), please bring them with you on the day of your procedure.   Your physician has requested that you go to the basement for lab work before leaving today.  I appreciate the opportunity to care for you.

## 2014-07-18 ENCOUNTER — Encounter (HOSPITAL_COMMUNITY): Payer: Self-pay | Admitting: Pharmacy Technician

## 2014-07-18 LAB — HEPATITIS C RNA QUANTITATIVE
HCV Quantitative Log: 6.08 {Log} — ABNORMAL HIGH (ref <1.18)
HCV Quantitative: 1202466 IU/mL — ABNORMAL HIGH (ref <15)

## 2014-07-19 ENCOUNTER — Encounter (HOSPITAL_COMMUNITY): Payer: Self-pay | Admitting: *Deleted

## 2014-07-19 ENCOUNTER — Encounter (HOSPITAL_COMMUNITY)
Admission: RE | Disposition: A | Discharge status: Home / Self Care | Payer: Self-pay | Source: Ambulatory Visit | Attending: Internal Medicine

## 2014-07-19 ENCOUNTER — Ambulatory Visit (HOSPITAL_COMMUNITY)
Admission: RE | Admit: 2014-07-19 | Discharge: 2014-07-19 | Disposition: A | Discharge status: Home / Self Care | Payer: BC Managed Care – PPO | Source: Ambulatory Visit | Attending: Internal Medicine | Admitting: Internal Medicine

## 2014-07-19 DIAGNOSIS — I8501 Esophageal varices with bleeding: Secondary | ICD-10-CM

## 2014-07-19 DIAGNOSIS — K921 Melena: Secondary | ICD-10-CM | POA: Insufficient documentation

## 2014-07-19 DIAGNOSIS — Z860100 Personal history of colon polyps, unspecified: Secondary | ICD-10-CM | POA: Diagnosis present

## 2014-07-19 DIAGNOSIS — D126 Benign neoplasm of colon, unspecified: Secondary | ICD-10-CM

## 2014-07-19 DIAGNOSIS — I8500 Esophageal varices without bleeding: Secondary | ICD-10-CM | POA: Insufficient documentation

## 2014-07-19 DIAGNOSIS — K766 Portal hypertension: Secondary | ICD-10-CM | POA: Insufficient documentation

## 2014-07-19 DIAGNOSIS — D696 Thrombocytopenia, unspecified: Secondary | ICD-10-CM | POA: Insufficient documentation

## 2014-07-19 DIAGNOSIS — D509 Iron deficiency anemia, unspecified: Secondary | ICD-10-CM | POA: Insufficient documentation

## 2014-07-19 DIAGNOSIS — Z6379 Other stressful life events affecting family and household: Secondary | ICD-10-CM | POA: Insufficient documentation

## 2014-07-19 DIAGNOSIS — K319 Disease of stomach and duodenum, unspecified: Secondary | ICD-10-CM

## 2014-07-19 DIAGNOSIS — D500 Iron deficiency anemia secondary to blood loss (chronic): Secondary | ICD-10-CM

## 2014-07-19 DIAGNOSIS — Z8379 Family history of other diseases of the digestive system: Secondary | ICD-10-CM | POA: Insufficient documentation

## 2014-07-19 DIAGNOSIS — F17200 Nicotine dependence, unspecified, uncomplicated: Secondary | ICD-10-CM | POA: Insufficient documentation

## 2014-07-19 DIAGNOSIS — B182 Chronic viral hepatitis C: Secondary | ICD-10-CM

## 2014-07-19 DIAGNOSIS — K7460 Unspecified cirrhosis of liver: Secondary | ICD-10-CM

## 2014-07-19 DIAGNOSIS — Z1211 Encounter for screening for malignant neoplasm of colon: Secondary | ICD-10-CM | POA: Insufficient documentation

## 2014-07-19 DIAGNOSIS — K3189 Other diseases of stomach and duodenum: Secondary | ICD-10-CM

## 2014-07-19 DIAGNOSIS — F1010 Alcohol abuse, uncomplicated: Secondary | ICD-10-CM | POA: Insufficient documentation

## 2014-07-19 DIAGNOSIS — K922 Gastrointestinal hemorrhage, unspecified: Secondary | ICD-10-CM | POA: Diagnosis present

## 2014-07-19 DIAGNOSIS — Z79899 Other long term (current) drug therapy: Secondary | ICD-10-CM | POA: Insufficient documentation

## 2014-07-19 HISTORY — DX: Other diseases of stomach and duodenum: K31.89

## 2014-07-19 HISTORY — DX: Iron deficiency anemia secondary to blood loss (chronic): D50.0

## 2014-07-19 HISTORY — PX: ESOPHAGOGASTRODUODENOSCOPY: SHX5428

## 2014-07-19 HISTORY — DX: Esophageal varices with bleeding: I85.01

## 2014-07-19 HISTORY — PX: COLONOSCOPY: SHX5424

## 2014-07-19 HISTORY — DX: Chronic viral hepatitis C: B18.2

## 2014-07-19 HISTORY — DX: Benign neoplasm of colon, unspecified: D12.6

## 2014-07-19 HISTORY — DX: Portal hypertension: K76.6

## 2014-07-19 SURGERY — EGD (ESOPHAGOGASTRODUODENOSCOPY)
Anesthesia: Moderate Sedation

## 2014-07-19 MED ORDER — BUTAMBEN-TETRACAINE-BENZOCAINE 2-2-14 % EX AERO
INHALATION_SPRAY | CUTANEOUS | Status: DC | PRN
  Administered 2014-07-19: 2 via TOPICAL

## 2014-07-19 MED ORDER — DIPHENHYDRAMINE HCL 50 MG/ML IJ SOLN
INTRAMUSCULAR | Status: DC | PRN
  Administered 2014-07-19: 25 mg via INTRAVENOUS

## 2014-07-19 MED ORDER — FENTANYL CITRATE 0.05 MG/ML IJ SOLN
INTRAMUSCULAR | Status: AC
  Filled 2014-07-19: qty 2

## 2014-07-19 MED ORDER — FENTANYL CITRATE 0.05 MG/ML IJ SOLN
INTRAMUSCULAR | Status: DC | PRN
  Administered 2014-07-19 (×4): 25 ug via INTRAVENOUS

## 2014-07-19 MED ORDER — DIPHENHYDRAMINE HCL 50 MG/ML IJ SOLN
INTRAMUSCULAR | Status: AC
  Filled 2014-07-19: qty 1

## 2014-07-19 MED ORDER — FERROUS SULFATE 325 (65 FE) MG PO TABS: 325.0000 mg | ORAL_TABLET | Freq: Two times a day (BID) | ORAL | Status: DC

## 2014-07-19 MED ORDER — MIDAZOLAM HCL 5 MG/ML IJ SOLN
INTRAMUSCULAR | Status: AC
  Filled 2014-07-19: qty 2

## 2014-07-19 MED ORDER — MIDAZOLAM HCL 10 MG/2ML IJ SOLN
INTRAMUSCULAR | Status: DC | PRN
  Administered 2014-07-19 (×3): 2 mg via INTRAVENOUS
  Administered 2014-07-19 (×2): 1 mg via INTRAVENOUS

## 2014-07-19 MED ORDER — SODIUM CHLORIDE 0.9 % IV SOLN
INTRAVENOUS | Status: DC
  Administered 2014-07-19: 500 mL via INTRAVENOUS

## 2014-07-19 NOTE — Discharge Instructions (Addendum)
You have esophageal varices - swollen veins in the esophagus - these are what bled. I put rubber bands on them to reduce chance of bleeding again. These are related to liver damage from hepatitis C and alcohol.  I found and removed 2 colon polyps - they look benign.  You will need additional testing and vaccinations, a repeat endoscopy in the next 1-2 months, and I will refer you to a specialist regarding treatment of hepatitis C.  You must stop drinking alcohol.  Please purchase ferrous sulfate and start taking to restore iron levels.  I appreciate the opportunity to care for you. Gatha Mayer, MD, FACG  YOU HAD AN ENDOSCOPIC PROCEDURE TODAY: Refer to the procedure report and other information in the discharge instructions given to you for any specific questions about what was found during the examination. If this information does not answer your questions, please call Dr. Celesta Aver office at [phone] to clarify.   YOU SHOULD EXPECT: Some feelings of bloating in the abdomen. Passage of more gas than usual. Walking can help get rid of the air that was put into your GI tract during the procedure and reduce the bloating. If you had a lower endoscopy (such as a colonoscopy or flexible sigmoidoscopy) you may notice spotting of blood in your stool or on the toilet paper. Some abdominal soreness may be present for a day or two, also.  DIET: Your first meal following the procedure should be a light meal and then it is ok to progress to your normal diet. A half-sandwich or bowl of soup is an example of a good first meal. Heavy or fried foods are harder to digest and may make you feel nauseous or bloated. Drink plenty of fluids but you should avoid alcoholic beverages for 24 hours.   ACTIVITY: Your care partner should take you home directly after the procedure. You should plan to take it easy, moving slowly for the rest of the day. You can resume normal activity the day after the procedure however  YOU SHOULD NOT DRIVE, use power tools, machinery or perform tasks that involve climbing or major physical exertion for 24 hours (because of the sedation medicines used during the test).   SYMPTOMS TO REPORT IMMEDIATELY: A gastroenterologist can be reached at any hour. Please call [phone]  for any of the following symptoms:  Following lower endoscopy (colonoscopy, flexible sigmoidoscopy) Excessive amounts of blood in the stool  Significant tenderness, worsening of abdominal pains  Swelling of the abdomen that is new, acute  Fever of 100 or higher  Following upper endoscopy (EGD, EUS, ERCP, esophageal dilation) Vomiting of blood or coffee ground material  New, significant abdominal pain  New, significant chest pain or pain under the shoulder blades  Painful or persistently difficult swallowing  New shortness of breath  Black, tarry-looking or red, bloody stools  FOLLOW UP:  If any biopsies were taken you will be contacted by phone or by letter within the next 1-3 weeks. Call [phone]  if you have not heard about the biopsies in 3 weeks.  Please also call with any specific questions about appointments or follow up tests.  Hepatitis C  Hepatitis C is a viral infection of the liver. Infection may go undetected for months or years because symptoms may be absent or very mild. Chronic liver disease is the main danger of hepatitis C. This may lead to scarring of the liver (cirrhosis), liver failure, and liver cancer.  CAUSES  Hepatitis C is caused by  the hepatitis C virus (HCV). Formerly, hepatitis C infections were most commonly transmitted through blood transfusions. In the early 1990s, routine testing of donated blood for hepatitis C and exclusion of blood that tests positive for HCV began. Now, HCV is most commonly transmitted from person to person through injection drug use, sharing needles, or sex with an infected person. A caregiver may also get the infection from exposure to the  blood of an infected patient by way of a cut or needle stick.  SYMPTOMS  Acute Phase  Many cases of acute HCV infection are mild and cause few problems. Some people may not even realize they are sick. Symptoms in others may last a few weeks to several months and include:  Feeling very tired.  Loss of appetite.  Nausea.  Vomiting.  Abdominal pain.  Dark yellow urine.  Yellow skin and eyes (jaundice).  Itching of the skin. Chronic Phase  Between 50% to 85% of people who get HCV infection become "chronic carriers." They often have no symptoms, but the virus stays in their body. They may spread the virus to others and can get long-term liver disease.  Many people with chronic HCV infection remain healthy for many years. However, up to 1 in 5 chronically infected people may develop severe liver diseases including scarring of the liver (cirrhosis), liver failure, or liver cancer. DIAGNOSIS  Diagnosis of hepatitis C infection is made by testing blood for the presence of hepatitis C viral particles called RNA. Other tests may also be done to measure the status of current liver function, exclude other liver problems, or assess liver damage.  TREATMENT  Treatment with many antiviral drugs is available and recommended for some patients with chronic HCV infection. Drug treatment is generally considered appropriate for patients who:  Are 62 years of age or older.  Have a positive test for HCV particles in the blood.  Have a liver tissue sample (biopsy) that shows chronic hepatitis and significant scarring (fibrosis).  Do not have signs of liver failure.  Have acceptable blood test results that confirm the wellness of other body organs.  Are willing to be treated and conform to treatment requirements.  Have no other circumstances that would prevent treatment from being recommended (contraindications). All people who are offered and choose to receive drug treatment must understand that careful medical  follow up for many months and even years is crucial in order to make successful care possible. The goal of drug treatment is to eliminate any evidence of HCV in the blood on a long-term basis. This is called a "sustained virologic response" or SVR. Achieving a SVR is associated with a decrease in the chance of life-threatening liver problems, need for a liver transplant, liver cancer rates, and liver-related complications.  Successful treatment currently requires taking treatment drugs for at least 24 weeks and up to 72 weeks. An injected drug (interferon) given weekly and an oral antiviral medicine taken daily are usually prescribed. Side effects from these drugs are common and some may be very serious. Your response to treatment must be carefully monitored by both you and your caregiver throughout the entire treatment period.  PREVENTION  There is no vaccine for hepatitis C. The only way to prevent the disease is to reduce the risk of exposure to the virus.  Avoid sharing drug needles or personal items like toothbrushes, razors, and nail clippers with an infected person.  Healthcare workers need to avoid injuries and wear appropriate protective equipment such as gloves, gowns,  and face masks when performing invasive medical or nursing procedures. HOME CARE INSTRUCTIONS  To avoid making your liver disease worse:  Strictly avoid drinking alcohol.  Carefully review all new prescriptions of medicines with your caregiver. Ask your caregiver which drugs you should avoid. The following drugs are toxic to the liver, and your caregiver may tell you to avoid them:  Isoniazid.  Methyldopa.  Acetaminophen.  Anabolic steroids (muscle-building drugs).  Erythromycin.  Oral contraceptives (birth control pills). Check with your caregiver to make sure medicine you are currently taking will not be harmful.  Periodic blood tests may be required. Follow your caregiver's advice about when you should have blood tests.   Avoid a sexual relationship until advised otherwise by your caregiver.  Avoid activities that could expose other people to your blood. Examples include sharing a toothbrush, nail clippers, razors, and needles.  Bed rest is not necessary, but it may make you feel better. Recovery time is not related to the amount of rest you receive.  This infection is contagious. Follow your caregiver's instructions in order to avoid spread of the infection. SEEK IMMEDIATE MEDICAL CARE IF:  You have increasing fatigue or weakness.  You have an oral temperature above 102 F (38.9 C), not controlled by medicine.  You develop loss of appetite, nausea, or vomiting.  You develop jaundice.  You develop easy bruising or bleeding.  You develop any severe problems as a result of your treatment. MAKE SURE YOU:  Understand these instructions.  Will watch your condition.  Will get help right away if you are not doing well or get worse. Document Released: 12/04/2000 Document Revised: 02/29/2012 Document Reviewed: 03/21/2014  [facility] Patient Information 2015 [place], [place]. This information is not intended to replace advice given to you by your health care provider. Make sure you discuss any questions you have with your health care provider.  Esophageal Varices    Esophageal varices are blood vessels in the esophagus (the tube that carries food to the stomach). Under normal circumstances, these blood vessels carry very small amounts of blood. If the liver is damaged, and the main vein (portal vein) that carries blood is blocked, larger amounts of blood might back up into these esophageal varices. The esophageal varices are too fragile for this extra blood flow and pressure. They may swell and then break, causing life-threatening bleeding (hemorrhage).  CAUSES  Any kind of liver disease can cause esophageal varices. Cirrhosis of the liver, usually due to alcoholism, is the most common reason. Other reasons include:   Severe heart failure: When the heart cannot pump blood around the body effectively enough, pressure may rise in the portal vein.  A blood clot in the portal vein.  Sarcoidosis. This is an inflammatory disease that can affect the liver.  Schistosomiasis. A parasitic infection that can cause liver damage. SYMPTOMS  Symptoms may include:  Vomiting bright red or black coffee ground like material.  Black, tarry stools.  Low blood pressure.  Dizziness.  Loss of consciousness. DIAGNOSIS  When someone has known cirrhosis, their caregiver may screen them for the presence of esophageal varices. Tests that are used include:  Endoscopy (esophagogastroduodenoscopy or EGD). A thin, lighted tube is inserted through the mouth and into the esophagus. The caregiver will rate the varices according to their size and the presence of red streaks. These characteristics help determine the risk of bleeding.  Imaging tests. CT scans and MRI scans can both show esophageal varices. However, they cannot predict likelihood of bleeding.  TREATMENT  There are different types of treatment used for esophageal varices. These include:  Variceal ligation. During EGD, the caregiver places a rubber band around the vein to prevent bleeding.  Injection therapy. During EGD, the caregiver can inject the veins with a solution that shrinks them and scars them closed.  Medications can decrease the pressure in the esophageal varices and prevent bleeding.  Balloon tamponade. A tube is put into the esophagus and a balloon is passed down it. When the balloon is inflated, it puts pressures on the veins and stops the bleeding.  Shunt. A small tube is placed within the liver veins. This decreases the blood flow and pressure to the varices, decreasing bleeding risk.  Liver transplant may be done as a last resort. HOME CARE INSTRUCTIONS  Take all medications exactly as directed.  Follow any prescribed diet. Avoid alcohol if recommended.   Follow instructions regarding both rest and physical activity.  Seek help to treat a drinking problem. SEEK IMMEDIATE MEDICAL CARE IF:  You vomit blood or coffee-ground material.  You pass black tarry stools or bright red blood in the stools.  You are dizzy, lightheaded or faint.  You are unable to eat or drink.  You experience chest pain. Document Released: 02/27/2004 Document Revised: 02/29/2012 Document Reviewed: 02/07/2009  [facility] Patient Information 2015 [place], [place]. This information is not intended to replace advice given to you by your health care provider. Make sure you discuss any questions you have with your health care provider.

## 2014-07-19 NOTE — H&P (View-Only) (Signed)
Subjective:    Patient ID: Francisco [name], male    DOB: 09-Nov-1956, 58 y.o.   MRN: [id number]  HPI  The patient is here to request of Dr. Annye Asa for evaluation of 1. He was seen in late may have a hemoglobin of 11.6 which is down from a normal hemoglobin a couple of years ago. He had an episode of melena this had once since. One time he vomited dark material. He has not felt unwell otherwise though is complaining of some peripheral edema. He was on some Carafate and is now on Nexium. He has a history of hepatitis C with a liver biopsy showing fibrosis in 2011, he saw the [facility] hepatologist but it does not sound like treatment was offered and I don't really have those records. He believes his problem was from fatty liver. He has just returned from a nice trip to the [facility] with his adult daughter. His GI review of systems is otherwise negative at this time  No Known Allergies Outpatient Prescriptions Prior to Visit  Medication Sig Dispense Refill  . esomeprazole (NEXIUM) 40 MG capsule Take 1 capsule (40 mg total) by mouth daily.  30 capsule  3  . sucralfate (CARAFATE) 1 G tablet 1 tab 1 h ac and hs  120 tablet  0   No facility-administered medications prior to visit.   Past Medical History  Diagnosis Date  . Allergy   . Fatty liver 2011    Abd [name]  . Diverticulosis 2008  . Hepatitis C 10/03/2010    surgical path  . Liver fibrosis 10/03/2010    surgical path   Past Surgical History  Procedure Laterality Date  . Orif zygomatic fracture Right   . Thyroidectomy  2012  . Colonoscopy  2008  . Radius synostosis proximal resection w/ allograft      at the elbow   History   Social History  . Marital Status: Divorced    Spouse Name: N/A    Number of Children: 1  . Years of Education: N/A   Occupational History  . operations officer     [employer]   Social History Main Topics  . Smoking status: Current Every Day Smoker -- 0.50 packs/day    Types:  Cigarettes    Start date: 05/08/1974  . Smokeless tobacco: Never Used  . Alcohol Use: 6.0 oz/week    10 Cans of beer per week  . Drug Use: No  . Sexual Activity: None   Other Topics Concern  . 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 He has about 4 beers per day and 2 caffeinated beverages per day.   Family History  Problem Relation Age of Onset  . Crohn's disease Father   . Heart disease Maternal Grandmother   . Alcohol abuse Maternal Grandfather   . Alcohol abuse Maternal Uncle     several  . Cancer Paternal Grandfather     type unknown, mets       Review of Systems Positive for anxiety fatigue insomnia and pedal edema. All other review of systems are negative.    Objective:   Physical Exam General:  Well-developed, well-nourished and in no acute distress Eyes:  anicteric. ENT:   Mouth and posterior pharynx free of lesions.  Neck:   supple w/o thyromegaly or mass.  Lungs: Clear to auscultation bilaterally. Heart:  S1S2, no rubs, murmurs, gallops. Abdomen:  soft, non-tender, no plenomegaly, positive small reducible umbilical hernia, or mass and BS+. I think the left lobe of the liver is palpable near the epigastrium Rectal: Deferred Lymph:  no cervical or supraclavicular adenopathy. Extremities:   1+ pretibial, mid leg edema bilateral, brown hyperpigmentation Skin  there is a rash in the intertriginous area of the lower abdomen, some brown hyperpigmentation and red maculopapular . He does have some spider angiomata on the upper back and upper chest. Neuro:  A&O x 3.  Psych:  appropriate mood and  Affect.   Data Reviewed: Colonoscopy from 2008 is reviewed, may 2015 primary care note labs reviewed in the EMR       Assessment & Plan:   1. Melena   2. Anemia, unspecified anemia type   3. Liver fibrosis   4. Chronic hepatitis C without hepatic  coma      He's had melena as I FOBT was also positive. He has thrombocytopenia, history of liver fibrosis and hepatitis C, spider angiomata. I suspect he may have cirrhosis of the liver and portal hypertension at this point. He needs an upper GI endoscopy and a colonoscopy to investigate his problems. We'll also check a metabolic panel, CBC, coags and repeat hepatitis C RNA. Further plans pending this.  Also uses alcohol regularly about 4 beers a day. This is probably part of his liver problem as well. Await other testing, suspect she will need to become abstinent. The risks and benefits as well as alternatives of endoscopic procedure(s) have been discussed and reviewed. All questions answered. The patient agrees to proceed.

## 2014-07-19 NOTE — Op Note (Signed)
[facility] [place], [address]   ENDOSCOPY PROCEDURE REPORT  PATIENT: Francisco [name], Francisco [name]  MR#: [id number] BIRTHDATE: 08/21/1956 , 58  yrs. old GENDER: Male ENDOSCOPIST: Gatha Mayer, MD, [facility] PROCEDURE DATE:  07/19/2014 PROCEDURE:  EGD w/ band ligation of varices ASA CLASS:     Class III INDICATIONS:  Melena.   Iron deficiency anemia. MEDICATIONS: Fentanyl 75 mcg IV, Versed 6 mg IV, and Benadryl 25 mg IV TOPICAL ANESTHETIC: Cetacaine Spray  DESCRIPTION OF PROCEDURE: After the risks benefits and alternatives of the procedure were thoroughly explained, informed consent was obtained.  The    endoscope was introduced through the mouth and advanced to the second portion of the duodenum. Without limitations.  The instrument was slowly withdrawn as the mucosa was fully examined.        ESOPHAGUS: There were 3 columns of medium size varices in the distal and middle third of the esophagus.  The varices were not actively bleeding.  Band ligation x 4 was performed.  STOMACH: Mild portal hypertensive gastropathy was found in the cardia, gastric fundus, and gastric body.  The remainder of the upper endoscopy exam was otherwise normal. Retroflexed views revealed as above.     The scope was then withdrawn from the patient and the procedure completed.  COMPLICATIONS: There were no complications. ENDOSCOPIC IMPRESSION: 1.   There were 3 columns of medium size varices in the distal and middle third of the esophagus; The varices were; band ligation x 4 was performed 2.   Portal hypertensive gastropathy was found in the cardia, gastric fundus, and gastric body - suspect cirrhosis from HCV/EtOH 3.   The remainder of the upper endoscopy exam was otherwise normal  RECOMMENDATIONS: 1.  Proceed with a Colonoscopy. 2.  Abdominal Ultrasoundwith hepatic elastography - office will arrange 3.  Continue PPI 4.  My office will also arrange HCV  genotype, hep B Surface antigen and antibody, Hep B core antibody total, Hepatitis A antibody total Pneumonia vaccine If has not had a Tdap booste should get one via PCP 5. Stop alcohol 6. Start ferrous sulfat 325 mg bid 7. Will need to arrange a repeat EGd and variceal banding for late Aug or Sept - will discuss with my staff and contact   eSigned:  Gatha Mayer, MD, [facility] 07/19/2014 8:47 AM   CC: Annye Asa, MD and The Patient  PATIENT NAME:  Francisco [name], Francisco [name] MR#: [id number]

## 2014-07-19 NOTE — Interval H&P Note (Signed)
History and Physical Interval Note:  07/19/2014 7:41 AM  Francisco [name]  has presented today for surgery, with the diagnosis of Melena [578.1]  The various methods of treatment have been discussed with the patient and family. After consideration of risks, benefits and other options for treatment, the patient has consented to  Procedure(s): ESOPHAGOGASTRODUODENOSCOPY (EGD) (N/A) COLONOSCOPY (N/A) as a surgical intervention .  The patient's history has been reviewed, patient examined, no change in status, stable for surgery.  I have reviewed the patient's chart and labs.  Questions were answered to the patient's satisfaction.     Silvano Rusk

## 2014-07-19 NOTE — Op Note (Signed)
[facility] [place], [address]   COLONOSCOPY PROCEDURE REPORT  PATIENT: Francisco [name], Francisco [name]  MR#: [id number] BIRTHDATE: Jan 19, 1956 , 58  yrs. old GENDER: Male ENDOSCOPIST: Gatha Mayer, MD, [facility]  PROCEDURE DATE:  07/19/2014 PROCEDURE:   Colonoscopy with snare polypectomy First Screening Colonoscopy - Avg.  risk and is 50 yrs.  old or older - No.  Prior Negative Screening - Now for repeat screening. N/A  History of Adenoma - Now for follow-up colonoscopy & has been > or = to 3 yrs.  N/A  Polyps Removed Today? Yes. ASA CLASS:   Class III INDICATIONS:Iron Deficiency Anemia. MEDICATIONS: There was residual sedation effect present from prior procedure, Fentanyl 25 mcg IV, and Versed-Detailed 2 mg IV  DESCRIPTION OF PROCEDURE:   After the risks benefits and alternatives of the procedure were thoroughly explained, informed consent was obtained.  A digital rectal exam revealed no abnormalities of the rectum, A digital rectal exam revealed no prostatic nodules, and A digital rectal exam revealed the prostate was not enlarged.   The Pentax Adult Colon [id number]  endoscope was introduced through the anus and advanced to the cecum, which was identified by both the appendix and ileocecal valve. No adverse events experienced.   The quality of the prep was good.  The instrument was then slowly withdrawn as the colon was fully examined.  COLON FINDINGS: Two sessile polyps measuring 5 and 7 mm in size were found in the ascending colon and descending colon.  A polypectomy was performed with a cold snare.  The resection was complete and the polyp tissue was completely retrieved.   The colon mucosa was otherwise normal.  Retroflexed views revealed no abnormalities. The time to cecum=9 minutes 0 seconds.  Withdrawal time=9 minutes 0 seconds.  The scope was withdrawn and the procedure completed. COMPLICATIONS: There were no complications.  ENDOSCOPIC  IMPRESSION: 1.   Two sessile polyps measuring 5 and 7 mm in size were found in the ascending colon and descending colon; polypectomy was performed with a cold snare 2.   The colon mucosa was otherwise normal  RECOMMENDATIONS: 1.  Timing of repeat colonoscopy will be determined by pathology findings. 2.  see EGD note also   eSigned:  Gatha Mayer, MD, [facility] 07/19/2014 8:54 AM   cc: Annye Asa, MD  and The Patient

## 2014-07-23 ENCOUNTER — Encounter (HOSPITAL_COMMUNITY): Payer: Self-pay | Admitting: Internal Medicine

## 2014-07-23 ENCOUNTER — Encounter: Payer: Self-pay | Admitting: Internal Medicine

## 2014-07-30 NOTE — Progress Notes (Signed)
Quick Note:  He is supposed to have further labs and [name] with elastography performed See EGD from 7/30 ______

## 2014-07-31 ENCOUNTER — Other Ambulatory Visit: Payer: Self-pay

## 2014-07-31 DIAGNOSIS — K766 Portal hypertension: Secondary | ICD-10-CM

## 2014-08-21 ENCOUNTER — Ambulatory Visit (HOSPITAL_COMMUNITY)
Admission: RE | Admit: 2014-08-21 | Discharge: 2014-08-21 | Disposition: A | Discharge status: Home / Self Care | Payer: BC Managed Care – PPO | Source: Ambulatory Visit | Attending: Internal Medicine | Admitting: Internal Medicine

## 2014-08-21 DIAGNOSIS — K8020 Calculus of gallbladder without cholecystitis without obstruction: Secondary | ICD-10-CM | POA: Insufficient documentation

## 2014-08-21 DIAGNOSIS — K766 Portal hypertension: Secondary | ICD-10-CM | POA: Insufficient documentation

## 2014-08-21 DIAGNOSIS — R161 Splenomegaly, not elsewhere classified: Secondary | ICD-10-CM | POA: Insufficient documentation

## 2014-08-24 NOTE — Progress Notes (Signed)
Quick Note:  The [name] confirms cirrhosis Fortunately there are good Tx meds today  1) Please remind him to do the labs we have ordered but not yet done 2) Schedule appt/consult w/ [facility] re: evaluate HCV and treat ______

## 2014-08-28 ENCOUNTER — Other Ambulatory Visit: Payer: Self-pay

## 2014-08-28 DIAGNOSIS — K7469 Other cirrhosis of liver: Secondary | ICD-10-CM

## 2014-08-28 DIAGNOSIS — B182 Chronic viral hepatitis C: Secondary | ICD-10-CM

## 2014-09-04 ENCOUNTER — Other Ambulatory Visit: Payer: BC Managed Care – PPO

## 2014-09-04 DIAGNOSIS — K766 Portal hypertension: Secondary | ICD-10-CM

## 2014-09-05 ENCOUNTER — Telehealth: Payer: Self-pay | Admitting: Internal Medicine

## 2014-09-05 ENCOUNTER — Ambulatory Visit (HOSPITAL_COMMUNITY)
Admission: RE | Admit: 2014-09-05 | Discharge: 2014-09-05 | Disposition: A | Discharge status: Home / Self Care | Payer: BC Managed Care – PPO | Source: Ambulatory Visit | Attending: Internal Medicine | Admitting: Internal Medicine

## 2014-09-05 ENCOUNTER — Encounter (HOSPITAL_COMMUNITY): Payer: Self-pay | Admitting: *Deleted

## 2014-09-05 ENCOUNTER — Encounter (HOSPITAL_COMMUNITY)
Admission: RE | Disposition: A | Discharge status: Home / Self Care | Payer: Self-pay | Source: Ambulatory Visit | Attending: Internal Medicine

## 2014-09-05 DIAGNOSIS — I8501 Esophageal varices with bleeding: Secondary | ICD-10-CM

## 2014-09-05 DIAGNOSIS — B182 Chronic viral hepatitis C: Secondary | ICD-10-CM | POA: Insufficient documentation

## 2014-09-05 DIAGNOSIS — D500 Iron deficiency anemia secondary to blood loss (chronic): Secondary | ICD-10-CM | POA: Insufficient documentation

## 2014-09-05 DIAGNOSIS — I8500 Esophageal varices without bleeding: Secondary | ICD-10-CM | POA: Insufficient documentation

## 2014-09-05 DIAGNOSIS — F17200 Nicotine dependence, unspecified, uncomplicated: Secondary | ICD-10-CM | POA: Insufficient documentation

## 2014-09-05 DIAGNOSIS — K766 Portal hypertension: Secondary | ICD-10-CM | POA: Insufficient documentation

## 2014-09-05 DIAGNOSIS — K3189 Other diseases of stomach and duodenum: Secondary | ICD-10-CM

## 2014-09-05 DIAGNOSIS — Z79899 Other long term (current) drug therapy: Secondary | ICD-10-CM | POA: Insufficient documentation

## 2014-09-05 DIAGNOSIS — K319 Disease of stomach and duodenum, unspecified: Secondary | ICD-10-CM | POA: Diagnosis not present

## 2014-09-05 DIAGNOSIS — K7689 Other specified diseases of liver: Secondary | ICD-10-CM | POA: Diagnosis not present

## 2014-09-05 HISTORY — DX: Benign neoplasm of colon, unspecified: D12.6

## 2014-09-05 HISTORY — PX: ESOPHAGOGASTRODUODENOSCOPY: SHX5428

## 2014-09-05 LAB — HEPATITIS B CORE ANTIBODY, TOTAL: HEP B C TOTAL AB: NONREACTIVE

## 2014-09-05 LAB — HEPATITIS B SURFACE ANTIGEN: Hepatitis B Surface Ag: NEGATIVE

## 2014-09-05 LAB — HEPATITIS C GENOTYPE

## 2014-09-05 LAB — HEPATITIS A ANTIBODY, TOTAL: Hep A Total Ab: NONREACTIVE

## 2014-09-05 LAB — HEPATITIS B SURFACE ANTIBODY,QUALITATIVE: HEP B S AB: NEGATIVE

## 2014-09-05 SURGERY — EGD (ESOPHAGOGASTRODUODENOSCOPY)
Anesthesia: Moderate Sedation

## 2014-09-05 MED ORDER — OXYCODONE HCL 5 MG PO TABA: ORAL_TABLET | ORAL | Status: DC

## 2014-09-05 MED ORDER — SODIUM CHLORIDE 0.9 % IV SOLN
INTRAVENOUS | Status: DC
  Administered 2014-09-05: 500 mL via INTRAVENOUS

## 2014-09-05 MED ORDER — BUTAMBEN-TETRACAINE-BENZOCAINE 2-2-14 % EX AERO
INHALATION_SPRAY | CUTANEOUS | Status: DC | PRN
  Administered 2014-09-05: 2 via TOPICAL

## 2014-09-05 MED ORDER — LIDOCAINE VISCOUS 2 % MT SOLN: 20.0000 mL | OROMUCOSAL | Status: DC | PRN

## 2014-09-05 MED ORDER — FENTANYL CITRATE 0.05 MG/ML IJ SOLN
INTRAMUSCULAR | Status: DC | PRN
  Administered 2014-09-05 (×3): 25 ug via INTRAVENOUS

## 2014-09-05 MED ORDER — MIDAZOLAM HCL 10 MG/2ML IJ SOLN
INTRAMUSCULAR | Status: DC | PRN
  Administered 2014-09-05 (×4): 2 mg via INTRAVENOUS

## 2014-09-05 MED ORDER — FENTANYL CITRATE 0.05 MG/ML IJ SOLN
INTRAMUSCULAR | Status: AC
  Filled 2014-09-05: qty 4

## 2014-09-05 MED ORDER — MIDAZOLAM HCL 10 MG/2ML IJ SOLN
INTRAMUSCULAR | Status: AC
  Filled 2014-09-05: qty 2

## 2014-09-05 NOTE — H&P (Signed)
[facility] History and Physical   Primary Care Physician:  Roselee Culver, MD   Reason for Procedure:   Follow-up esophageal varices  Plan:    EGD, possibly band varices     The risks and benefits as well as alternatives of endoscopic procedure(s) have been discussed and reviewed. All questions answered. The patient agrees to proceed.      HPI: Francisco [name] is a 58 y.o. male here for f/u evaluation and treatment of esophageal varices   Past Medical History  Diagnosis Date  . Allergy   . Fatty liver 2011    Abd [name]  . Diverticulosis 2008  . Hepatitis C 10/03/2010    surgical path  . Liver fibrosis 10/03/2010    surgical path  . Esophageal varices with bleeding 07/19/2014  . Portal hypertensive gastropathy 07/19/2014  . Chronic hepatitis C 07/19/2014  . Iron deficiency anemia secondary to blood loss (chronic) 07/19/2014    Past Surgical History  Procedure Laterality Date  . Orif zygomatic fracture Right   . Thyroidectomy  2012  . Colonoscopy  2008  . Radius synostosis proximal resection w/ allograft      at the elbow  . Esophagogastroduodenoscopy N/A 07/19/2014    Procedure: ESOPHAGOGASTRODUODENOSCOPY (EGD);  Surgeon: Gatha Mayer, MD;  Location: [facility] ENDOSCOPY;  Service: Endoscopy;  Laterality: N/A;  . Colonoscopy N/A 07/19/2014    Procedure: COLONOSCOPY;  Surgeon: Gatha Mayer, MD;  Location: [facility];  Service: Endoscopy;  Laterality: N/A;    Prior to Admission medications   Medication Sig Start Date End Date Taking? Authorizing Provider  esomeprazole (NEXIUM) 40 MG capsule Take 1 capsule (40 mg total) by mouth daily. 05/08/14  Yes Roselee Culver, MD  ferrous sulfate 325 (65 FE) MG tablet Take 1 tablet (325 mg total) by mouth 2 (two) times daily with a meal. 07/19/14  Yes Gatha Mayer, MD  Multiple Vitamin (MULTIVITAMIN) tablet Take 1 tablet by mouth daily.   Yes Historical Provider, MD    Current Facility-Administered Medications   Medication Dose Route Frequency Provider Last Rate Last Dose  . 0.9 %  sodium chloride infusion   Intravenous Continuous Gatha Mayer, MD 20 mL/hr at 09/05/14 0818 500 mL at 09/05/14 0818    Allergies as of 07/31/2014  . (No Known Allergies)    Family History  Problem Relation Age of Onset  . Crohn's disease Father   . Heart disease Maternal Grandmother   . Alcohol abuse Maternal Grandfather   . Alcohol abuse Maternal Uncle     several  . Cancer Paternal Grandfather     type unknown, mets    History   Social History  . Marital Status: Divorced    Spouse Name: N/A    Number of Children: 1  . Years of Education: N/A   Occupational History  . operations officer     [employer]   Social History Main Topics  . Smoking status: Current Every Day Smoker -- 0.50 packs/day for 40 years    Types: Cigarettes    Start date: 05/08/1974  . Smokeless tobacco: Never Used  . Alcohol Use: 6.0 oz/week    10 Cans of beer per week  . Drug Use: No  . Sexual Activity: Not on file   Other Topics Concern  . 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 He has about 4 beers per day and 2 caffeinated beverages per day.    Review of Systems:  All other review of systems negative except as mentioned in the HPI.  Physical Exam: Vital signs in last 24 hours: Temp:  [98 F (36.7 C)] 98 F (36.7 C) (09/16 0813) Resp:  [16] 16 (09/16 0813) BP: (103)/(55) 103/55 mmHg (09/16 0813) SpO2:  [96 %] 96 % (09/16 0813) Weight:  [235 lb (106.595 kg)] 235 lb (106.595 kg) (09/16 0813)   General:   Alert,  Well-developed, well-nourished, pleasant and cooperative in NAD Lungs:  Clear throughout to auscultation.   Heart:  Regular rate and rhythm; no murmurs, clicks, rubs,  or gallops. Abdomen:  Soft, nontender and nondistended. Normal bowel sounds.  Small umbilical  hernia Neuro/Psych:  Alert and cooperative. Normal mood and affect. A and O x 3   @Ralene Gasparyan  Simonne Maffucci, MD, [facility] [phone] (pager) 09/05/2014 8:30 AM@

## 2014-09-05 NOTE — Op Note (Signed)
[facility] [place], [address]   ENDOSCOPY PROCEDURE REPORT  PATIENT: Francisco, [name]  MR#: [id number] BIRTHDATE: March 23, 1956 , 58  yrs. old GENDER: Male ENDOSCOPIST: Gatha Mayer, MD, [facility] PROCEDURE DATE:  09/05/2014 PROCEDURE:  EGD w/ band ligation of varices ASA CLASS:     Class III INDICATIONS:  band esophageal varices. MEDICATIONS: Fentanyl 75 mcg IV and Versed 8 mg IV TOPICAL ANESTHETIC: Cetacaine Spray  DESCRIPTION OF PROCEDURE: After the risks benefits and alternatives of the procedure were thoroughly explained, informed consent was obtained.  The Pentax Gastroscope [id number] endoscope was introduced through the mouth and advanced to the stomach body. Without limitations.  The instrument was slowly withdrawn as the mucosa was fully examined.        ESOPHAGUS: There were 3 columns of medium size varices in the distal and middle third of the esophagus.  The varices were not actively bleeding.  Band ligation x 5 was performed.  STOMACH: Moderate portal hypertensive gastropathy was found in the cardia and gastric body.  Retroflexion was not performed.     The scope was then withdrawn from the patient and the procedure completed.  COMPLICATIONS: There were no complications. ENDOSCOPIC IMPRESSION: 1.   There were 3 columns of medium size varices in the distal and middle third of the esophagus; The varices were banded x 5 2.   Portal hypertensive gastropathy was found in the cardia and gastric body  RECOMMENDATIONS: Repeat EGD/banding in October during my hospital week   eSigned:  Gatha Mayer, MD, [facility] 09/05/2014 9:08 AM   CC:The Patient

## 2014-09-05 NOTE — Discharge Instructions (Addendum)
I found and banded 5 areas of the varices today.  We will contact you and arrange another exam and possible banding for October.  I appreciate the opportunity to care for you. Gatha Mayer, MD, FACG  YOU HAD AN ENDOSCOPIC PROCEDURE TODAY: Refer to the procedure report and other information in the discharge instructions given to you for any specific questions about what was found during the examination. If this information does not answer your questions, please call Dr. Celesta Aver office at [phone] to clarify.   YOU SHOULD EXPECT: Some feelings of bloating in the abdomen. Passage of more gas than usual. Walking can help get rid of the air that was put into your GI tract during the procedure and reduce the bloating. If you had a lower endoscopy (such as a colonoscopy or flexible sigmoidoscopy) you may notice spotting of blood in your stool or on the toilet paper. Some abdominal soreness may be present for a day or two, also.  DIET: Your first meal following the procedure should be a light meal and then it is ok to progress to your normal diet. A half-sandwich or bowl of soup is an example of a good first meal. Heavy or fried foods are harder to digest and may make you feel nauseous or bloated. Drink plenty of fluids but you should avoid alcoholic beverages for 24 hours.   ACTIVITY: Your care partner should take you home directly after the procedure. You should plan to take it easy, moving slowly for the rest of the day. You can resume normal activity the day after the procedure however YOU SHOULD NOT DRIVE, use power tools, machinery or perform tasks that involve climbing or major physical exertion for 24 hours (because of the sedation medicines used during the test).   SYMPTOMS TO REPORT IMMEDIATELY: A gastroenterologist can be reached at any hour. Please call [phone]  for any of the following symptoms:  Following upper endoscopy (EGD, EUS, ERCP, esophageal dilation) Vomiting of blood or  coffee ground material  New, significant abdominal pain  New, significant chest pain or pain under the shoulder blades  Painful or persistently difficult swallowing  New shortness of breath  Black, tarry-looking or red, bloody stools  FOLLOW UP:  If any biopsies were taken you will be contacted by phone or by letter within the next 1-3 weeks. Call [phone]  if you have not heard about the biopsies in 3 weeks.  Please also call with any specific questions about appointments or follow up tests.  Esophagogastroduodenoscopy Care After Refer to this sheet in the next few weeks. These instructions provide you with information on caring for yourself after your procedure. Your caregiver may also give you more specific instructions. Your treatment has been planned according to current medical practices, but problems sometimes occur. Call your caregiver if you have any problems or questions after your procedure.  HOME CARE INSTRUCTIONS  Do not eat or drink anything until the numbing medicine (local anesthetic) has worn off and your gag reflex has returned. You will know that the local anesthetic has worn off when you can swallow comfortably.  Do not drive for 12 hours after the procedure or as directed by your caregiver.  Only take medicines as directed by your caregiver. SEEK MEDICAL CARE IF:   You cannot stop coughing.  You are not urinating at all or less than usual. SEEK IMMEDIATE MEDICAL CARE IF:  You have difficulty swallowing.  You cannot eat or drink.  You have worsening throat  or chest pain.  You have dizziness, lightheadedness, or you faint.  You have nausea or vomiting.  You have chills.  You have a fever.  You have severe abdominal pain.  You have black, tarry, or bloody stools. Document Released: 11/23/2012 Document Reviewed: 11/23/2012 [facility] Patient Information 2015 [place]. This information is not intended to replace advice given to you by your health  care provider. Make sure you discuss any questions you have with your health care provider.

## 2014-09-05 NOTE — Telephone Encounter (Signed)
Patient is s/p EGD with esophageal banding this am.  Patient reports chest pain, entire chest.  Discussed with Dr. Carlean Purl.  Patient to start viscous xylocaine prn and oxycodone 5 mg q 4 hours prn.  Daughter notified to come pick up rx.  They will call back for any additional questions or worsening pain.

## 2014-09-06 ENCOUNTER — Encounter (HOSPITAL_COMMUNITY): Payer: Self-pay | Admitting: Internal Medicine

## 2014-09-06 ENCOUNTER — Encounter: Payer: Self-pay | Admitting: Internal Medicine

## 2014-09-06 NOTE — Progress Notes (Signed)
Quick Note:  He is not immune to Hep A or B and will need vaccines - can do with [name] or with [facility] when he goes there They will use genotype info (1 a) to decide Tx regimen for HCV I hope chest pain after banding is better ______

## 2014-09-07 ENCOUNTER — Other Ambulatory Visit: Payer: Self-pay

## 2014-09-07 ENCOUNTER — Telehealth: Payer: Self-pay

## 2014-09-07 DIAGNOSIS — I8500 Esophageal varices without bleeding: Secondary | ICD-10-CM

## 2014-09-07 NOTE — Telephone Encounter (Signed)
Scheduled for repeat esophogeal banding for 10/17/14 8:30 with the patient at [facility].  He verbalized understanding to arrive at 7:30

## 2014-09-11 ENCOUNTER — Other Ambulatory Visit: Payer: BC Managed Care – PPO

## 2014-09-11 DIAGNOSIS — B182 Chronic viral hepatitis C: Secondary | ICD-10-CM

## 2014-09-12 ENCOUNTER — Ambulatory Visit (INDEPENDENT_AMBULATORY_CARE_PROVIDER_SITE_OTHER): Payer: BC Managed Care – PPO | Admitting: Internal Medicine

## 2014-09-12 VITALS — Ht 71.0 in

## 2014-09-12 DIAGNOSIS — Z23 Encounter for immunization: Secondary | ICD-10-CM

## 2014-09-12 DIAGNOSIS — B182 Chronic viral hepatitis C: Secondary | ICD-10-CM

## 2014-09-12 LAB — ANA: ANA: NEGATIVE

## 2014-09-12 LAB — HIV ANTIBODY (ROUTINE TESTING W REFLEX): HIV 1&2 Ab, 4th Generation: NONREACTIVE

## 2014-09-12 LAB — IRON: Iron: 41 ug/dL — ABNORMAL LOW (ref 42–165)

## 2014-09-14 ENCOUNTER — Encounter: Payer: Self-pay | Admitting: Internal Medicine

## 2014-10-09 ENCOUNTER — Encounter: Payer: Self-pay | Admitting: Internal Medicine

## 2014-10-09 ENCOUNTER — Ambulatory Visit (INDEPENDENT_AMBULATORY_CARE_PROVIDER_SITE_OTHER): Payer: BC Managed Care – PPO | Admitting: Internal Medicine

## 2014-10-09 ENCOUNTER — Other Ambulatory Visit: Payer: Self-pay | Admitting: *Deleted

## 2014-10-09 VITALS — BP 114/73 | HR 71 | Temp 97.6°F | Ht 71.5 in | Wt 227.0 lb

## 2014-10-09 DIAGNOSIS — Z23 Encounter for immunization: Secondary | ICD-10-CM | POA: Diagnosis not present

## 2014-10-09 DIAGNOSIS — B182 Chronic viral hepatitis C: Secondary | ICD-10-CM

## 2014-10-09 MED ORDER — LEDIPASVIR-SOFOSBUVIR 90-400 MG PO TABS: 1.0000 | ORAL_TABLET | Freq: Every day | ORAL | Status: DC

## 2014-10-09 NOTE — Progress Notes (Signed)
+Francisco [name] is a 58 y.o. male who presents for initial evaluation and management of a positive Hepatitis C antibody test.  Patient tested positive 2011. Hepatitis C risk factors present are: none. Patient denies IV drug abuse, sexual contact with person with liver disease, tattoos. Patient has had other studies performed. Results: hepatitis C RNA by PCR, result: positive. Patient has not had prior treatment for Hepatitis C. Patient does have a past history of liver disease. Patient does not have a family history of liver disease.   HPI: He was seen in 2011 by Dr. Patsy Baltimore and biopsy at that time with F3/F4 and also elastography here with F4. He has completely quit drinking alcohol.  He has had banding for esophageal varices and due for further intervention.    Patient does not have documented immunity to Hepatitis A. Patient does not have documented immunity to Hepatitis B.     Review of Systems A comprehensive review of systems was negative.   Past Medical History  Diagnosis Date  . Allergy   . Fatty liver 2011    Abd [name]  . Diverticulosis 2008  . Hepatitis C 10/03/2010    surgical path  . Liver fibrosis 10/03/2010    surgical path  . Esophageal varices with bleeding 07/19/2014  . Portal hypertensive gastropathy 07/19/2014  . Chronic hepatitis C 07/19/2014  . Iron deficiency anemia secondary to blood loss (chronic) 07/19/2014  . Benign neoplasm of colon 07/19/2014    07/19/14 - 2 adenomas - repeat colonoscopy 2020     Prior to Admission medications   Medication Sig Start Date End Date Taking? Authorizing Provider  esomeprazole (NEXIUM) 40 MG capsule Take 1 capsule (40 mg total) by mouth daily. 05/08/14   Roselee Culver, MD  ferrous sulfate 325 (65 FE) MG tablet Take 1 tablet (325 mg total) by mouth 2 (two) times daily with a meal. 07/19/14   Gatha Mayer, MD  lidocaine (XYLOCAINE) 2 % solution Use as directed 20 mLs in the mouth or throat as needed for mouth pain. 09/05/14   Gatha Mayer, MD  Multiple Vitamin (MULTIVITAMIN) tablet Take 1 tablet by mouth daily.    Historical Provider, MD  OxyCODONE HCl, Abuse Deter, 5 MG TABA Take 5 mg every 4 hours as needed for chest pain 09/05/14   Janett Billow D. Zehr, PA-C    No Known Allergies  History  Substance Use Topics  . Smoking status: Current Every Day Smoker -- 0.50 packs/day for 40 years    Types: Cigarettes    Start date: 05/08/1974  . Smokeless tobacco: Never Used  . Alcohol Use: 6.0 oz/week    10 Cans of beer per week    Family History  Problem Relation Age of Onset  . Crohn's disease Father   . Heart disease Maternal Grandmother   . Alcohol abuse Maternal Grandfather   . Alcohol abuse Maternal Uncle     several  . Cancer Paternal Grandfather     type unknown, mets      Objective:  There were no vitals filed for this visit. in no apparent distress, alert and oriented times 3 HEENT: anicteric Cor RRR and No murmurs clear Bowel sounds are normal, liver is not enlarged, spleen is not enlarged; + umbilical hernia peripheral pulses normal, no pedal edema, no clubbing or cyanosis --- POSITIVES: hyperpigmentation -  torso, angiomata - torso  Laboratory Genotype:  Lab Results  Component Value Date   HCVGENOTYPE 1a 09/04/2014   HCV  viral load:  Lab Results  Component Value Date   HCVQUANT [id number]* 07/16/2014   Lab Results  Component Value Date   WBC 2.9* 07/16/2014   HGB 8.8 Repeated and verified X2.* 07/16/2014   HCT 26.5 Repeated and verified X2.* 07/16/2014   MCV 82.5 07/16/2014   PLT 111.0* 07/16/2014    Lab Results  Component Value Date   CREATININE 0.7 07/16/2014   BUN 10 07/16/2014   NA 138 07/16/2014   K 4.2 07/16/2014   CL 109 07/16/2014   CO2 27 07/16/2014    Lab Results  Component Value Date   ALT 50 07/16/2014   AST 50* 07/16/2014   ALKPHOS 133* 07/16/2014   BILITOT 0.7 07/16/2014   INR 1.1* 07/16/2014      Assessment: Hepatitis C genotype 1a  Plan: 1) Patient counseled extensively  on limiting acetaminophen to no more than 2 grams daily, avoidance of alcohol. 2) Transmission discussed with patient including sexual transmission, sharing razors and toothbrush.   3) Will need referral to gastroenterology if concern for cirrhosis 4) Will need referral for substance abuse counseling: No. 5) Will prescribe Harvoni for 12 weeks, should not get Vikeira pak with anemia. Has had biopsy in 2011 with results of F3/4 so qualifies for treatment by any standards.  Also with elastography with F4.  Has obvious cirrhosis with banding, Splenomegaly.  6) Hepatitis A vaccine Yes.   at GI 7) Hepatitis B vaccine Yes.  at Gi 8) Pneumovax vaccine given 9) will follow up after starting medication

## 2014-10-09 NOTE — Patient Instructions (Signed)
Date 10/09/2014  Dear Mr. Arnott, As discussed in the [facility], your hepatitis C therapy will include the following medications:          Harvoni 90mg /400mg  tablet:           Take 1 tablet by mouth once daily   Please note that ALL MEDICATIONS WILL START ON THE SAME DATE for a total of 12 weeks. ---------------------------------------------------------------- Your HCV Treatment Start Date: TBA   Your HCV genotype:  1a    Liver Fibrosis: F4    ---------------------------------------------------------------- YOUR PHARMACY CONTACT:   [place] [facility] [place] Phone: [phone] Hours: Monday to Friday 7:30 am to 6:00 pm   Please always contact your pharmacy at least 3-4 business days before you run out of medications to ensure your next month's medication is ready or 1 week prior to running out if you receive it by mail.  Remember, each prescription is for 28 days. ---------------------------------------------------------------- GENERAL NOTES REGARDING YOUR HEPATITIS C MEDICATION:  SOFOSBUVIR/LEDIPASVIR (HARVONI): - Harvoni tablet is taken daily with OR without food. - The tablets are orange. - The tablets should be stored at room temperature.  - Acid reducing agents such as H2 blockers (ie. Pepcid (famotidine), Zantac (ranitidine), Tagamet (cimetidine), Axid (nizatidine) and proton pump inhibitors (ie. Prilosec (omeprazole), Protonix (pantoprazole), Nexium (esomeprazole), or Aciphex (rabeprazole)) can decrease effectiveness of Harvoni. Do not take until you have discussed with a health care provider.    -Antacids that contain magnesium and/or aluminum hydroxide (ie. Milk of Magensia, Rolaids, Gaviscon, Maalox, Mylanta, an dArthritis Pain Formula)can reduce absorption of Harvoni, so take them at least 4 hours before or after Harvoni.  -Calcium carbonate (calcium supplements or antacids such as Tums,  Caltrate, Os-Cal)needs to be taken at least 4 hours hours before or after Harvoni.  -St. John's wort or any products that contain St. John's wort like some herbal supplements  Please inform the office prior to starting any of these medications.  - The common side effects with Harvoni:      1. Fatigue      2. Headache      3. Nausea      4. Diarrhea      5. Insomnia   Support Path is a suite of resources designed to help patients start with HARVONI and move toward treatment completion [place] helps patients access therapy and get off to an efficient start  Benefits investigation and prior authorization support Co-pay and other financial assistance A specialty pharmacy finder CO-PAY COUPON The [place] co-pay coupon may help eligible patients lower their out-of-pocket costs. With a co-pay coupon, most eligible patients may pay no more than $5 per co-pay (restrictions apply) www.harvoni.com call 5645513521 Not valid for patients enrolled in government healthcare prescription drug programs, such as Medicare Part D and Medicaid. Patients in the coverage gap known as the "donut hole" also are not eligible The HARVONI co-pay coupon program will cover the out-of-pocket costs for HARVONI prescriptions up to a maximum of 25% of the catalog price of a 12-week regimen of HARVONI  Please note that this only lists the most common side effects and is NOT a comprehensive list of the potential side effects of these medications. For more information, please review the drug information sheets that come with your medication package from the pharmacy.  ---------------------------------------------------------------- GENERAL HELPFUL HINTS ON HCV THERAPY: 1. No alcohol. 2. Protect against sun-sensitivity/sunburns (wear sunglasses, hat, long sleeves, pants  and sunscreen). 3. Stay well-hydrated/well-moisturized. 4. Notify the [facility] of any changes in your other  over-the-counter/herbal or prescription medications. 5. If you miss a dose of your medication, take the missed dose as soon as you remember. Return to your regular time/dose schedule the next day.  6.  Do not stop taking your medications without first talking with your healthcare provider. 7.  You may take Tylenol (acetaminophen), as long as the dose is less than 2000 mg (OR no more than 4 tablets of the Tylenol Extra Strengths 500mg  tablet) in 24 hours. 8.  You will need to obtain routine labs and/or office visits at [facility] at weeks 2, 4, 8,  and 12 as well as 12 and 24 weeks after completion of treatment.   Scharlene Gloss, [place] for [facility] [address] [place], [place]  [phone]

## 2014-10-09 NOTE — Addendum Note (Signed)
Addended by: Myrtis Hopping A on: 10/09/2014 11:41 AM   Modules accepted: Orders

## 2014-10-12 ENCOUNTER — Ambulatory Visit (INDEPENDENT_AMBULATORY_CARE_PROVIDER_SITE_OTHER): Payer: BC Managed Care – PPO | Admitting: Internal Medicine

## 2014-10-12 DIAGNOSIS — Z23 Encounter for immunization: Secondary | ICD-10-CM

## 2014-10-15 ENCOUNTER — Telehealth: Payer: Self-pay | Admitting: *Deleted

## 2014-10-15 NOTE — Telephone Encounter (Signed)
Patient's Harvoni approved for 12 weeks. Notified patient and [facility]. Note sent to Dr. Linus Salmons to see when patient should follow up.

## 2014-10-16 ENCOUNTER — Other Ambulatory Visit: Payer: Self-pay | Admitting: Internal Medicine

## 2014-10-16 DIAGNOSIS — B182 Chronic viral hepatitis C: Secondary | ICD-10-CM

## 2014-10-16 NOTE — Telephone Encounter (Signed)
2 week follow up with me and pharmacist after starting, then HCV viral load, cmp and cbc at 4 weeks after starting and appt with me again 1 week after labs.  I will put in labs.  thanks

## 2014-10-17 ENCOUNTER — Ambulatory Visit (HOSPITAL_COMMUNITY)
Admission: RE | Admit: 2014-10-17 | Discharge: 2014-10-17 | Disposition: A | Discharge status: Home / Self Care | Payer: BC Managed Care – PPO | Source: Ambulatory Visit | Attending: Internal Medicine | Admitting: Internal Medicine

## 2014-10-17 ENCOUNTER — Telehealth: Payer: Self-pay | Admitting: Internal Medicine

## 2014-10-17 ENCOUNTER — Encounter (HOSPITAL_COMMUNITY): Payer: Self-pay | Admitting: *Deleted

## 2014-10-17 ENCOUNTER — Encounter (HOSPITAL_COMMUNITY)
Admission: RE | Disposition: A | Discharge status: Home / Self Care | Payer: Self-pay | Source: Ambulatory Visit | Attending: Internal Medicine

## 2014-10-17 DIAGNOSIS — K3189 Other diseases of stomach and duodenum: Secondary | ICD-10-CM | POA: Diagnosis not present

## 2014-10-17 DIAGNOSIS — I8500 Esophageal varices without bleeding: Secondary | ICD-10-CM

## 2014-10-17 DIAGNOSIS — K7469 Other cirrhosis of liver: Secondary | ICD-10-CM | POA: Diagnosis not present

## 2014-10-17 DIAGNOSIS — F17210 Nicotine dependence, cigarettes, uncomplicated: Secondary | ICD-10-CM | POA: Insufficient documentation

## 2014-10-17 DIAGNOSIS — K740 Hepatic fibrosis: Secondary | ICD-10-CM | POA: Insufficient documentation

## 2014-10-17 DIAGNOSIS — I8511 Secondary esophageal varices with bleeding: Secondary | ICD-10-CM | POA: Insufficient documentation

## 2014-10-17 DIAGNOSIS — K766 Portal hypertension: Secondary | ICD-10-CM | POA: Diagnosis not present

## 2014-10-17 HISTORY — PX: ESOPHAGOGASTRODUODENOSCOPY: SHX5428

## 2014-10-17 HISTORY — PX: GASTRIC VARICES BANDING: SHX5519

## 2014-10-17 HISTORY — DX: Unspecified cirrhosis of liver: K74.60

## 2014-10-17 SURGERY — EGD (ESOPHAGOGASTRODUODENOSCOPY)
Anesthesia: Moderate Sedation

## 2014-10-17 MED ORDER — SODIUM CHLORIDE 0.9 % IV SOLN: INTRAVENOUS | Status: DC

## 2014-10-17 MED ORDER — BUTAMBEN-TETRACAINE-BENZOCAINE 2-2-14 % EX AERO
INHALATION_SPRAY | CUTANEOUS | Status: DC | PRN
  Administered 2014-10-17: 2 via TOPICAL

## 2014-10-17 MED ORDER — MIDAZOLAM HCL 10 MG/2ML IJ SOLN
INTRAMUSCULAR | Status: AC
  Filled 2014-10-17: qty 2

## 2014-10-17 MED ORDER — OXYCODONE HCL 5 MG PO TABA: 5.0000 mg | ORAL_TABLET | ORAL | Status: DC

## 2014-10-17 MED ORDER — FENTANYL CITRATE 0.05 MG/ML IJ SOLN
INTRAMUSCULAR | Status: AC
  Filled 2014-10-17: qty 2

## 2014-10-17 MED ORDER — MIDAZOLAM HCL 10 MG/2ML IJ SOLN
INTRAMUSCULAR | Status: DC | PRN
  Administered 2014-10-17 (×2): 2 mg via INTRAVENOUS
  Administered 2014-10-17: 1 mg via INTRAVENOUS
  Administered 2014-10-17: 2 mg via INTRAVENOUS

## 2014-10-17 MED ORDER — FENTANYL CITRATE 0.05 MG/ML IJ SOLN
INTRAMUSCULAR | Status: DC | PRN
  Administered 2014-10-17 (×3): 25 ug via INTRAVENOUS

## 2014-10-17 NOTE — H&P (Signed)
[facility] History and Physical   Primary Care Physician:  Roselee Culver, MD   Reason for Procedure:   F/u and band esophageal varices  Plan:    Esophagoscopy/EGD, variceal banding     HPI: Francisco [name] is a 58 y.o. male with HCV cirrhosis and esophageal varices. Has had bleeding from varices. Treated with banding x 5 09/09/14. Here for repeat exam and treatment without c/o bleeding. Had some pain after banding then had sore throat a week later "bug went around at work"   Past Medical History  Diagnosis Date  . Allergy   . Fatty liver 2011    Abd [name]  . Diverticulosis 2008  . Hepatitis C 10/03/2010    surgical path  . Liver fibrosis 10/03/2010    surgical path  . Esophageal varices with bleeding 07/19/2014  . Portal hypertensive gastropathy 07/19/2014  . Chronic hepatitis C 07/19/2014  . Iron deficiency anemia secondary to blood loss (chronic) 07/19/2014  . Benign neoplasm of colon 07/19/2014    07/19/14 - 2 adenomas - repeat colonoscopy 2020     Past Surgical History  Procedure Laterality Date  . Orif zygomatic fracture Right   . Thyroidectomy  2012  . Colonoscopy  2008  . Radius synostosis proximal resection w/ allograft      at the elbow  . Esophagogastroduodenoscopy N/A 07/19/2014    Procedure: ESOPHAGOGASTRODUODENOSCOPY (EGD);  Surgeon: Gatha Mayer, MD;  Location: [facility] ENDOSCOPY;  Service: Endoscopy;  Laterality: N/A;  . Colonoscopy N/A 07/19/2014    Procedure: COLONOSCOPY;  Surgeon: Gatha Mayer, MD;  Location: [facility];  Service: Endoscopy;  Laterality: N/A;  . Esophagogastroduodenoscopy N/A 09/05/2014    Procedure: ESOPHAGOGASTRODUODENOSCOPY (EGD);  Surgeon: Gatha Mayer, MD;  Location: [facility] ENDOSCOPY;  Service: Endoscopy;  Laterality: N/A;    Prior to Admission medications   Medication Sig Start Date End Date Taking? Authorizing Provider  lidocaine (XYLOCAINE) 2 % solution Use as directed 20 mLs in the mouth or throat as needed for  mouth pain. 09/05/14  Yes Gatha Mayer, MD  Multiple Vitamin (MULTIVITAMIN) tablet Take 1 tablet by mouth daily.   Yes Historical Provider, MD  esomeprazole (NEXIUM) 40 MG capsule Take 1 capsule (40 mg total) by mouth daily. 05/08/14   Roselee Culver, MD  Ledipasvir-Sofosbuvir (HARVONI) 90-400 MG TABS Take 1 tablet by mouth daily. 10/09/14   Thayer Headings, MD    Current Facility-Administered Medications  Medication Dose Route Frequency Provider Last Rate Last Dose  . 0.9 %  sodium chloride infusion   Intravenous Continuous Gatha Mayer, MD        Allergies as of 09/07/2014  . (No Known Allergies)    Family History  Problem Relation Age of Onset  . Crohn's disease Father   . Heart disease Maternal Grandmother   . Alcohol abuse Maternal Grandfather   . Alcohol abuse Maternal Uncle     several  . Cancer Paternal Grandfather     type unknown, mets    History   Social History  . Marital Status: Divorced    Spouse Name: N/A    Number of Children: 1  . Years of Education: N/A   Occupational History  . operations officer     [employer]   Social History Main Topics  . Smoking status: Current Every Day Smoker -- 0.25 packs/day for 40 years    Types: Cigarettes    Start date: 05/08/1974  . Smokeless tobacco: Never Used  Comment: cutting back  . Alcohol Use: No     Comment: quit 08/27/14  . Drug Use: No  . Sexual Activity: Not on file   Other Topics Concern  . 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 He has about 4 beers per day and 2 caffeinated beverages per day.    Review of Systems: All other review of systems negative except as mentioned in the HPI.  Physical Exam: Vital signs in last 24 hours:     General:   Alert,  Well-developed, well-nourished, pleasant and cooperative in NAD Lungs:  Clear throughout to auscultation.    Heart:  Regular rate and rhythm; no murmurs, clicks, rubs,  or gallops. Abdomen:  Soft, nontender and nondistended. Normal bowel sounds.   Neuro/Psych:  Alert and cooperative. Normal mood and affect. A and O x 3   @Carl  Simonne Maffucci, MD, [facility] [phone] (pager) 10/17/2014 8:17 AM@

## 2014-10-17 NOTE — Discharge Instructions (Signed)
° °  I banded 2 varices today. They were overall smaller.  Use the viscous xylocaine if there is pain again and call us if needed.  Stop the Nexium  I appreciate the opportunity to care for you. Gatha Mayer, MD, FACG  YOU HAD AN ENDOSCOPIC PROCEDURE TODAY: Refer to the procedure report and other information in the discharge instructions given to you for any specific questions about what was found during the examination. If this information does not answer your questions, please call Dr. Celesta Aver office at [phone] to clarify.   YOU SHOULD EXPECT: Some feelings of bloating in the abdomen. Passage of more gas than usual. Walking can help get rid of the air that was put into your GI tract during the procedure and reduce the bloating. If you had a lower endoscopy (such as a colonoscopy or flexible sigmoidoscopy) you may notice spotting of blood in your stool or on the toilet paper. Some abdominal soreness may be present for a day or two, also.  DIET: Your first meal following the procedure should be a light meal and then it is ok to progress to your normal diet. A half-sandwich or bowl of soup is an example of a good first meal. Heavy or fried foods are harder to digest and may make you feel nauseous or bloated. Drink plenty of fluids but you should avoid alcoholic beverages for 24 hours.   ACTIVITY: Your care partner should take you home directly after the procedure. You should plan to take it easy, moving slowly for the rest of the day. You can resume normal activity the day after the procedure however YOU SHOULD NOT DRIVE, use power tools, machinery or perform tasks that involve climbing or major physical exertion for 24 hours (because of the sedation medicines used during the test).   SYMPTOMS TO REPORT IMMEDIATELY: A gastroenterologist can be reached at any hour. Please call [phone]  for any of the following symptoms:   Following upper endoscopy (EGD, EUS, ERCP, esophageal  dilation) Vomiting of blood or coffee ground material  New, significant abdominal pain  New, significant chest pain or pain under the shoulder blades  Painful or persistently difficult swallowing  New shortness of breath  Black, tarry-looking or red, bloody stools  FOLLOW UP:  If any biopsies were taken you will be contacted by phone or by letter within the next 1-3 weeks. Call [phone]  if you have not heard about the biopsies in 3 weeks.  Please also call with any specific questions about appointments or follow up tests.

## 2014-10-17 NOTE — Telephone Encounter (Signed)
Having chest pain like after last banding  xylocaine did not help  Will rx narcotic again and daughter can pick up

## 2014-10-17 NOTE — Op Note (Signed)
[place], [id number]   ENDOSCOPY PROCEDURE REPORT  PATIENT: Francisco [name], Francisco [name]  MR#: [id number] BIRTHDATE: June 22, 1956 , 72  yrs. old GENDER: male ENDOSCOPIST: Gatha Mayer, MD, [facility] PROCEDURE DATE:  10/17/2014 PROCEDURE:  Esophagoscopy and variceal banding ASA CLASS:     Class III INDICATIONS:  therapeutic procedure and band varices. MEDICATIONS: Fentanyl 75 mcg IV and Versed 7 mg IV TOPICAL ANESTHETIC: Cetacaine Spray  DESCRIPTION OF PROCEDURE: After the risks benefits and alternatives of the procedure were thoroughly explained, informed consent was obtained.  The Pentax Gastroscope [id number] endoscope was introduced through the mouth and advanced to the stomach antrum , Without limitations.  The instrument was slowly withdrawn as the mucosa was fully examined.    1) 3 columns 1-2 + varices in mid-distal esophagus 2) 2 bands placed good result 3) portal gastropathy in stomach.  Retroflexed views revealed as previously described.     The scope was then withdrawn from the patient and the procedure completed.  COMPLICATIONS: There were no immediate complications.  ENDOSCOPIC IMPRESSION: 1) 3 columns 1-2 + varices in mid-distal esophagus 2) 2 bands placed good result 3) portal gastropathy in stomach  RECOMMENDATIONS: Schedule for EGD with possible banding (mod sedation ok) during my December hospital week Stop Nexium (does not have GERD) Consider beta blocker at some point    eSigned:  Gatha Mayer, MD, [facility] 10/17/2014 8:52 AM    CC: The Patient

## 2014-10-17 NOTE — Telephone Encounter (Signed)
rx signed by Tye Savoy RNP  rx up front

## 2014-10-18 ENCOUNTER — Encounter (HOSPITAL_COMMUNITY): Payer: Self-pay | Admitting: Internal Medicine

## 2014-10-22 NOTE — Telephone Encounter (Signed)
Patient started Harvoni today; scheduled lab appt in 4 weeks and follow up with Dr. Linus Salmons in 5. MD has no available appt in 2 weeks. Does he really need to come in or only if he is having an issue. He said he does not anticipate any problems with the medication.

## 2014-10-22 NOTE — Telephone Encounter (Signed)
Only if he is really having any issues is fine.

## 2014-10-25 ENCOUNTER — Other Ambulatory Visit: Payer: Self-pay

## 2014-10-25 DIAGNOSIS — I8500 Esophageal varices without bleeding: Secondary | ICD-10-CM

## 2014-11-21 ENCOUNTER — Other Ambulatory Visit: Payer: BC Managed Care – PPO

## 2014-11-21 DIAGNOSIS — B182 Chronic viral hepatitis C: Secondary | ICD-10-CM

## 2014-11-22 LAB — CBC WITH DIFFERENTIAL/PLATELET
Basophils Absolute: 0 10*3/uL (ref 0.0–0.1)
Basophils Relative: 0 % (ref 0–1)
EOS PCT: 2 % (ref 0–5)
Eosinophils Absolute: 0.1 10*3/uL (ref 0.0–0.7)
HCT: 40.6 % (ref 39.0–52.0)
Hemoglobin: 14.5 g/dL (ref 13.0–17.0)
LYMPHS ABS: 1.3 10*3/uL (ref 0.7–4.0)
Lymphocytes Relative: 40 % (ref 12–46)
MCH: 31.5 pg (ref 26.0–34.0)
MCHC: 35.7 g/dL (ref 30.0–36.0)
MCV: 88.3 fL (ref 78.0–100.0)
MONO ABS: 0.2 10*3/uL (ref 0.1–1.0)
MONOS PCT: 6 % (ref 3–12)
MPV: 9.5 fL (ref 9.4–12.4)
Neutro Abs: 1.7 10*3/uL (ref 1.7–7.7)
Neutrophils Relative %: 52 % (ref 43–77)
Platelets: 61 10*3/uL — ABNORMAL LOW (ref 150–400)
RBC: 4.6 MIL/uL (ref 4.22–5.81)
RDW: 16.7 % — ABNORMAL HIGH (ref 11.5–15.5)
WBC: 3.3 10*3/uL — ABNORMAL LOW (ref 4.0–10.5)

## 2014-11-22 LAB — COMPLETE METABOLIC PANEL WITH GFR
ALT: 36 U/L (ref 0–53)
AST: 37 U/L (ref 0–37)
Albumin: 3.7 g/dL (ref 3.5–5.2)
Alkaline Phosphatase: 149 U/L — ABNORMAL HIGH (ref 39–117)
BUN: 15 mg/dL (ref 6–23)
CALCIUM: 10.2 mg/dL (ref 8.4–10.5)
CHLORIDE: 105 meq/L (ref 96–112)
CO2: 26 mEq/L (ref 19–32)
CREATININE: 0.81 mg/dL (ref 0.50–1.35)
GFR, Est African American: >89 mL/min
GFR, Est Non African American: >89 mL/min
Glucose, Bld: 127 mg/dL — ABNORMAL HIGH (ref 70–99)
POTASSIUM: 3.9 meq/L (ref 3.5–5.3)
Sodium: 138 mEq/L (ref 135–145)
Total Bilirubin: 0.7 mg/dL (ref 0.2–1.2)
Total Protein: 7.8 g/dL (ref 6.0–8.3)

## 2014-11-22 LAB — HEPATITIS C RNA QUANTITATIVE: HCV QUANT: NOT DETECTED [IU]/mL (ref <15)

## 2014-11-28 ENCOUNTER — Ambulatory Visit (INDEPENDENT_AMBULATORY_CARE_PROVIDER_SITE_OTHER): Payer: BC Managed Care – PPO | Admitting: Internal Medicine

## 2014-11-28 ENCOUNTER — Encounter: Payer: Self-pay | Admitting: Internal Medicine

## 2014-11-28 VITALS — BP 126/77 | HR 83 | Temp 98.0°F | Wt 227.0 lb

## 2014-11-28 DIAGNOSIS — B182 Chronic viral hepatitis C: Secondary | ICD-10-CM

## 2014-11-28 NOTE — Progress Notes (Signed)
Patient comes in for follow up on HCV therapy with Harvoni.  He is  Lab Results  Component Value Date   HCVGENOTYPE 1a 09/04/2014   with elastography consistent with F4 and initial viral load  Lab Results  Component Value Date   HCVQUANT Not Detected 11/21/2014   .  Patient started Harvoni on 5 weeks ago.  Has had no side effects, no complaints.  No missed doses.    No new meds  Filed Vitals:   11/28/14 1438  BP: 126/77  Pulse: 83  Temp: 98 F (36.7 C)   in no apparent distress HEENT: anicteric Cor RRR and No murmurs clear Bowel sounds are normal, liver is not enlarged, spleen is not enlarged peripheral pulses normal, no pedal edema, no clubbing or cyanosis negative for - jaundice, spider hemangioma, telangiectasia, palmar erythema, ecchymosis and atrophy  A/P: 1) continue with Harvoni for 12 weeks 2) Viral load undetectable at week 4, will check at 12 weeks 3) Viral load at [facility] 4) Vaccines - A and B per GI 5) Ultrasound every 6 months Yes.  GI is following patient with compensated cirrhosis.

## 2014-12-19 ENCOUNTER — Encounter (HOSPITAL_COMMUNITY): Payer: Self-pay

## 2014-12-19 ENCOUNTER — Encounter: Payer: Self-pay | Admitting: *Deleted

## 2014-12-19 ENCOUNTER — Ambulatory Visit (HOSPITAL_COMMUNITY)
Admission: RE | Admit: 2014-12-19 | Discharge: 2014-12-19 | Disposition: A | Discharge status: Home / Self Care | Payer: BC Managed Care – PPO | Source: Ambulatory Visit | Attending: Internal Medicine | Admitting: Internal Medicine

## 2014-12-19 ENCOUNTER — Encounter (HOSPITAL_COMMUNITY)
Admission: RE | Disposition: A | Discharge status: Home / Self Care | Payer: Self-pay | Source: Ambulatory Visit | Attending: Internal Medicine

## 2014-12-19 DIAGNOSIS — I8500 Esophageal varices without bleeding: Secondary | ICD-10-CM

## 2014-12-19 DIAGNOSIS — K3189 Other diseases of stomach and duodenum: Secondary | ICD-10-CM | POA: Insufficient documentation

## 2014-12-19 DIAGNOSIS — F17210 Nicotine dependence, cigarettes, uncomplicated: Secondary | ICD-10-CM | POA: Insufficient documentation

## 2014-12-19 HISTORY — PX: ESOPHAGOGASTRODUODENOSCOPY: SHX5428

## 2014-12-19 SURGERY — EGD (ESOPHAGOGASTRODUODENOSCOPY)
Anesthesia: Moderate Sedation

## 2014-12-19 MED ORDER — FENTANYL CITRATE 0.05 MG/ML IJ SOLN
INTRAMUSCULAR | Status: AC
  Filled 2014-12-19: qty 2

## 2014-12-19 MED ORDER — FENTANYL CITRATE 0.05 MG/ML IJ SOLN
INTRAMUSCULAR | Status: DC | PRN
  Administered 2014-12-19 (×3): 25 ug via INTRAVENOUS

## 2014-12-19 MED ORDER — SODIUM CHLORIDE 0.9 % IV SOLN
INTRAVENOUS | Status: DC
  Administered 2014-12-19: 500 mL via INTRAVENOUS

## 2014-12-19 MED ORDER — DIPHENHYDRAMINE HCL 50 MG/ML IJ SOLN
INTRAMUSCULAR | Status: AC
  Filled 2014-12-19: qty 1

## 2014-12-19 MED ORDER — BUTAMBEN-TETRACAINE-BENZOCAINE 2-2-14 % EX AERO
INHALATION_SPRAY | CUTANEOUS | Status: DC | PRN
  Administered 2014-12-19: 2 via TOPICAL

## 2014-12-19 MED ORDER — MIDAZOLAM HCL 10 MG/2ML IJ SOLN
INTRAMUSCULAR | Status: DC | PRN
Start: 2014-12-19 — End: 2014-12-19
  Administered 2014-12-19 (×3): 2 mg via INTRAVENOUS

## 2014-12-19 MED ORDER — MIDAZOLAM HCL 10 MG/2ML IJ SOLN
INTRAMUSCULAR | Status: AC
  Filled 2014-12-19: qty 2

## 2014-12-19 NOTE — Op Note (Signed)
[place], [id number]   ENDOSCOPY PROCEDURE REPORT  PATIENT: Francisco [name], Francisco [name]  MR#: [id number] BIRTHDATE: 1956-11-21 , 57  yrs. old GENDER: male ENDOSCOPIST: Gatha Mayer, MD, [facility] PROCEDURE DATE:  12/19/2014 PROCEDURE:  Esophagoscopy ASA CLASS:     Class III INDICATIONS:  f/u varices with prior bleeding. MEDICATIONS: Fentanyl 75 mcg IV and Versed 6 mg IV TOPICAL ANESTHETIC: Cetacaine Spray  DESCRIPTION OF PROCEDURE: After the risks benefits and alternatives of the procedure were thoroughly explained, informed consent was obtained.  The [place] [id number] endoscope was introduced through the mouth and advanced to the stomach antrum , Without limitations.  The instrument was slowly withdrawn as the mucosa was fully examined.    1) Small esophageal varices, 3 columns, flat-1+ at most and no stigmata of bleeding - mid-distal esophagus 2) Mild-moderate portal gastropathy with mosaic pattern and red spoys in fundus and body 3) Otherwise normal esophagus and stomach.  Retroflexed views revealed no abnormalities.     The scope was then withdrawn from the patient and the procedure completed.  COMPLICATIONS: There were no immediate complications.  ENDOSCOPIC IMPRESSION: 1) Small esophageal varices, 3 columns, flat-1+ at most and no stigmata of bleeding - mid-distal esophagus 2) Mild-moderate portal gastropathy with mosaic pattern and red spoys in fundus and body 3) Otherwise normal esophagus and stomach  RECOMMENDATIONS: 1) Repeat EGD 6 months - recall 2) He is to call my office to obtain F/U 1-2 months - will consider beta blocker but pulse here already in 60's - perhaps low dose   eSigned:  Gatha Mayer, MD, [facility] 12/19/2014 9:25 AM    CC: The Patient

## 2014-12-19 NOTE — H&P (Signed)
[facility] History and Physical   Primary Care Physician:  Roselee Culver, MD   Reason for Procedure:   F/U esophageal varices    Plan:    Upper endoscopy, possible variceal banding The risks and benefits as well as alternatives of endoscopic procedure(s) have been discussed and reviewed. All questions answered. The patient agrees to proceed.      HPI: Francisco [name] is a 58 y.o. male who needs f/u esophageal varices.   Past Medical History  Diagnosis Date  . Allergy   . Fatty liver 2011    Abd [name]  . Diverticulosis 2008  . Esophageal varices with bleeding 07/19/2014  . Portal hypertensive gastropathy 07/19/2014  . Chronic hepatitis C 07/19/2014  . Iron deficiency anemia secondary to blood loss (chronic) 07/19/2014  . Benign neoplasm of colon 07/19/2014    07/19/14 - 2 adenomas - repeat colonoscopy 2020   . Cirrhosis     Past Surgical History  Procedure Laterality Date  . Orif zygomatic fracture Right   . Thyroidectomy  2012  . Colonoscopy  2008  . Radius synostosis proximal resection w/ allograft      at the elbow  . Esophagogastroduodenoscopy N/A 07/19/2014    Procedure: ESOPHAGOGASTRODUODENOSCOPY (EGD);  Surgeon: Gatha Mayer, MD;  Location: [facility] ENDOSCOPY;  Service: Endoscopy;  Laterality: N/A;  . Colonoscopy N/A 07/19/2014    Procedure: COLONOSCOPY;  Surgeon: Gatha Mayer, MD;  Location: [facility];  Service: Endoscopy;  Laterality: N/A;  . Esophagogastroduodenoscopy N/A 09/05/2014    Procedure: ESOPHAGOGASTRODUODENOSCOPY (EGD);  Surgeon: Gatha Mayer, MD;  Location: [facility] ENDOSCOPY;  Service: Endoscopy;  Laterality: N/A;  . Esophagogastroduodenoscopy N/A 10/17/2014    Procedure: ESOPHAGOGASTRODUODENOSCOPY (EGD);  Surgeon: Gatha Mayer, MD;  Location: [facility] ENDOSCOPY;  Service: Endoscopy;  Laterality: N/A;  . Gastric varices banding N/A 10/17/2014    Procedure: GASTRIC VARICES BANDING;  Surgeon: Gatha Mayer, MD;  Location: [facility] ENDOSCOPY;   Service: Endoscopy;  Laterality: N/A;    Prior to Admission medications   Medication Sig Start Date End Date Taking? Authorizing Provider  ferrous sulfate 325 (65 FE) MG tablet Take 325 mg by mouth daily with breakfast.   Yes Historical Provider, MD  Ledipasvir-Sofosbuvir (HARVONI) 90-400 MG TABS Take 1 tablet by mouth daily. 10/09/14  Yes Thayer Headings, MD  Multiple Vitamin (MULTIVITAMIN) tablet Take 1 tablet by mouth daily.   Yes Historical Provider, MD    Current Facility-Administered Medications  Medication Dose Route Frequency Provider Last Rate Last Dose  . 0.9 %  sodium chloride infusion   Intravenous Continuous Gatha Mayer, MD 20 mL/hr at 12/19/14 0820 500 mL at 12/19/14 0820    Allergies as of 10/25/2014  . (No Known Allergies)    Family History  Problem Relation Age of Onset  . Crohn's disease Father   . Heart disease Maternal Grandmother   . Alcohol abuse Maternal Grandfather   . Alcohol abuse Maternal Uncle     several  . Cancer Paternal Grandfather     type unknown, mets    History   Social History  . Marital Status: Divorced    Spouse Name: N/A    Number of Children: 1  . Years of Education: N/A   Occupational History  . operations officer     [employer]   Social History Main Topics  . Smoking status: Current Every Day Smoker -- 0.50 packs/day for 40 years    Types: Cigarettes    Start  date: 05/08/1974  . Smokeless tobacco: Never Used     Comment: cutting back  . Alcohol Use: No     Comment: quit 08/27/14  . Drug Use: No  . Sexual Activity: Not on file   Other Topics Concern  . 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 He has about 4 beers per day and 2 caffeinated beverages per day.    Review of Systems:  All other review of systems negative except as mentioned in the HPI.  Physical Exam: Vital signs in last  24 hours: Temp:  [98 F (36.7 C)] 98 F (36.7 C) (12/30 0812) Pulse Rate:  [59-69] 69 (12/30 0833) Resp:  [14-17] 14 (12/30 0833) BP: (129-147)/(68-74) 147/68 mmHg (12/30 0833) SpO2:  [98 %-99 %] 99 % (12/30 0833)   General:   Alert,  Well-developed, well-nourished, pleasant and cooperative in NAD Lungs:  Clear throughout to auscultation.   Heart:  Regular rate and rhythm; no murmurs, clicks, rubs,  or gallops. Abdomen:  Soft, nontender and nondistended. Normal bowel sounds.   Neuro/Psych:  Alert and cooperative. Normal mood and affect. A and O x 3   @Markis Langland  Simonne Maffucci, MD, [facility] [phone] (pager) 12/19/2014 8:40 AM@

## 2014-12-19 NOTE — Discharge Instructions (Signed)
° °  The varices were too small to band today.  Will need to check again in about 6 months. You need to be considered to start a medication called nadolol to reduce risk of future bleeding but I want to see you back in the office before we try that. Please call and get an appointment to see me - Feb would be ok.  I appreciate the opportunity to care for you. Gatha Mayer, MD, FACG   YOU HAD AN ENDOSCOPIC PROCEDURE TODAY: Refer to the procedure report and other information in the discharge instructions given to you for any specific questions about what was found during the examination. If this information does not answer your questions, please call Dr. Celesta Aver office at [phone] to clarify.   YOU SHOULD EXPECT: Some feelings of bloating in the abdomen. Passage of more gas than usual. Walking can help get rid of the air that was put into your GI tract during the procedure and reduce the bloating. If you had a lower endoscopy (such as a colonoscopy or flexible sigmoidoscopy) you may notice spotting of blood in your stool or on the toilet paper. Some abdominal soreness may be present for a day or two, also.  DIET: Your first meal following the procedure should be a light meal and then it is ok to progress to your normal diet. A half-sandwich or bowl of soup is an example of a good first meal. Heavy or fried foods are harder to digest and may make you feel nauseous or bloated. Drink plenty of fluids but you should avoid alcoholic beverages for 24 hours.   ACTIVITY: Your care partner should take you home directly after the procedure. You should plan to take it easy, moving slowly for the rest of the day. You can resume normal activity the day after the procedure however YOU SHOULD NOT DRIVE, use power tools, machinery or perform tasks that involve climbing or major physical exertion for 24 hours (because of the sedation medicines used during the test).   SYMPTOMS TO REPORT IMMEDIATELY: A  gastroenterologist can be reached at any hour. Please call [phone]  for any of the following symptoms:   Following upper endoscopy (EGD, EUS, ERCP, esophageal dilation) Vomiting of blood or coffee ground material  New, significant abdominal pain  New, significant chest pain or pain under the shoulder blades  Painful or persistently difficult swallowing  New shortness of breath  Black, tarry-looking or red, bloody stools  FOLLOW UP:  If any biopsies were taken you will be contacted by phone or by letter within the next 1-3 weeks. Call [phone]  if you have not heard about the biopsies in 3 weeks.  Please also call with any specific questions about appointments or follow up tests.

## 2014-12-20 ENCOUNTER — Encounter (HOSPITAL_COMMUNITY): Payer: Self-pay | Admitting: Internal Medicine

## 2015-01-23 ENCOUNTER — Other Ambulatory Visit: Payer: BLUE CROSS/BLUE SHIELD

## 2015-01-23 DIAGNOSIS — B182 Chronic viral hepatitis C: Secondary | ICD-10-CM

## 2015-01-24 LAB — HEPATITIS C RNA QUANTITATIVE: HCV Quantitative: NOT DETECTED IU/mL (ref <15)

## 2015-01-31 ENCOUNTER — Encounter: Payer: Self-pay | Admitting: Internal Medicine

## 2015-01-31 ENCOUNTER — Ambulatory Visit (INDEPENDENT_AMBULATORY_CARE_PROVIDER_SITE_OTHER): Payer: BLUE CROSS/BLUE SHIELD | Admitting: Internal Medicine

## 2015-01-31 ENCOUNTER — Telehealth: Payer: Self-pay | Admitting: *Deleted

## 2015-01-31 VITALS — BP 127/76 | HR 80 | Temp 97.6°F | Ht 72.0 in | Wt 225.0 lb

## 2015-01-31 DIAGNOSIS — B182 Chronic viral hepatitis C: Secondary | ICD-10-CM

## 2015-01-31 DIAGNOSIS — K7460 Unspecified cirrhosis of liver: Secondary | ICD-10-CM | POA: Insufficient documentation

## 2015-01-31 NOTE — Telephone Encounter (Addendum)
Ultrasound scheduled 5/5 at [facility] 7:30 (arrive 7:15). Patient to be NPO for 6 hr. Pt notified, accepted.

## 2015-01-31 NOTE — Progress Notes (Signed)
Patient comes in for follow up on HCV therapy with Harvoni.  He is  Lab Results  Component Value Date   HCVGENOTYPE 1a 09/04/2014   with elastography consistent with F4 and initial viral load  Lab Results  Component Value Date   HCVQUANT Not Detected 01/23/2015   .  Patient now completed Harvoni.  Has had no side effects, no complaints.  No missed doses.    No new meds  There were no vitals filed for this visit. in no apparent distress HEENT: anicteric Cor RRR and No murmurs clear Bowel sounds are normal, liver is not enlarged, spleen is not enlarged peripheral pulses normal, no pedal edema, no clubbing or cyanosis negative for - jaundice, spider hemangioma, telangiectasia, palmar erythema, ecchymosis and atrophy  A/P: 1) Completed Harvoni for 12 weeks 2) Viral load undetectable after tretament, recheck in 12 and 24 weeks SVR  3) Vaccines - A and B per GI 4) Ultrasound every 6 months Yes.  , prior to next appt in 3 months, I will order.  GI is following patient with compensated cirrhosis, variceal banding multiple times.  Follow up in about 1 year per patient.

## 2015-02-12 ENCOUNTER — Ambulatory Visit (INDEPENDENT_AMBULATORY_CARE_PROVIDER_SITE_OTHER): Payer: BLUE CROSS/BLUE SHIELD | Admitting: Internal Medicine

## 2015-02-12 ENCOUNTER — Encounter: Payer: Self-pay | Admitting: Internal Medicine

## 2015-02-12 ENCOUNTER — Other Ambulatory Visit (INDEPENDENT_AMBULATORY_CARE_PROVIDER_SITE_OTHER): Payer: BLUE CROSS/BLUE SHIELD

## 2015-02-12 VITALS — BP 112/70 | HR 72 | Resp 14 | Ht 72.0 in | Wt 229.0 lb

## 2015-02-12 DIAGNOSIS — I8501 Esophageal varices with bleeding: Secondary | ICD-10-CM

## 2015-02-12 DIAGNOSIS — K7460 Unspecified cirrhosis of liver: Secondary | ICD-10-CM

## 2015-02-12 DIAGNOSIS — D509 Iron deficiency anemia, unspecified: Secondary | ICD-10-CM

## 2015-02-12 LAB — CBC WITH DIFFERENTIAL/PLATELET
BASOS ABS: 0 10*3/uL (ref 0.0–0.1)
Basophils Relative: 0.3 % (ref 0.0–3.0)
EOS PCT: 2.5 % (ref 0.0–5.0)
Eosinophils Absolute: 0.1 10*3/uL (ref 0.0–0.7)
HCT: 43.2 % (ref 39.0–52.0)
HEMOGLOBIN: 15 g/dL (ref 13.0–17.0)
Lymphocytes Relative: 30.7 % (ref 12.0–46.0)
Lymphs Abs: 1.4 10*3/uL (ref 0.7–4.0)
MCHC: 34.8 g/dL (ref 30.0–36.0)
MCV: 90.9 fl (ref 78.0–100.0)
MONO ABS: 0.4 10*3/uL (ref 0.1–1.0)
MONOS PCT: 8.4 % (ref 3.0–12.0)
Neutro Abs: 2.6 10*3/uL (ref 1.4–7.7)
Neutrophils Relative %: 58.1 % (ref 43.0–77.0)
PLATELETS: 76 10*3/uL — AB (ref 150.0–400.0)
RBC: 4.75 Mil/uL (ref 4.22–5.81)
RDW: 13.7 % (ref 11.5–15.5)
WBC: 4.5 10*3/uL (ref 4.0–10.5)

## 2015-02-12 LAB — FERRITIN: FERRITIN: 41.5 ng/mL (ref 22.0–322.0)

## 2015-02-12 NOTE — Assessment & Plan Note (Addendum)
Repeat EGD in summer Decided not to use of beta blocker at this point. Pulses often in the 60s, we talked about the pros and cons of this and will consider that later depending upon his clinical course.

## 2015-02-12 NOTE — Patient Instructions (Signed)
Your physician has requested that you go to the basement for the following lab work before leaving today: CBC/diff, Ferritin   I appreciate the opportunity to care for you.  

## 2015-02-12 NOTE — Assessment & Plan Note (Signed)
CBC, ferritin 

## 2015-02-12 NOTE — Assessment & Plan Note (Signed)
Stable [name] in May

## 2015-02-13 NOTE — Progress Notes (Signed)
Quick Note:  To stay on ferrous sulfate for now w/ ferritin 41 My Chart notice sent ______

## 2015-02-13 NOTE — Progress Notes (Signed)
   Subjective:    Patient ID: Francisco [name], male    DOB: 1956-04-10, 59 y.o.   MRN: [id number] Chief complaint is for follow-up of cirrhosis and varices of the esophagus HPI The patient is here with a history of cirrhosis and hepatitis C and history of bleeding esophageal varices and portal gastropathy. He has done well and last endoscopy on 12/19/2014 he had very small varices so no banding was performed. He has not had further signs of bleeding. He remains on iron for anemia. He is feeling well at this point, in general though he is been having some knee pain. He is on Harvoni for hepatitis C.  Medications, allergies, past medical history, past surgical history, family history and social history are reviewed and updated in the EMR.  Review of Systems As per history of present illness    Objective:   Physical Exam General:  NAD Eyes:   anicteric Lungs:  clear Heart:  S1S2 no rubs, murmurs or gallops Abdomen:  soft and nontender, BS+  Data Reviewed:  As per history of present illness. I've also reviewed labs in the EMR.    Assessment & Plan:  Hepatic cirrhosis Stable [name] in May    Esophageal varices with bleeding Repeat EGD in summer Decided not to use of beta blocker at this point. Pulses often in the 60s, we talked about the pros and cons of this and will consider that later depending upon his clinical course.   Iron deficiency anemia due to chronic blood loss CBC, ferritin    Lab Results  Component Value Date   WBC 4.5 02/12/2015   HGB 15.0 02/12/2015   HCT 43.2 02/12/2015   MCV 90.9 02/12/2015   PLT 76.0* 02/12/2015   Lab Results  Component Value Date   FERRITIN 41.5 02/12/2015

## 2015-03-14 ENCOUNTER — Ambulatory Visit (INDEPENDENT_AMBULATORY_CARE_PROVIDER_SITE_OTHER): Payer: BLUE CROSS/BLUE SHIELD | Admitting: Internal Medicine

## 2015-03-14 DIAGNOSIS — Z23 Encounter for immunization: Secondary | ICD-10-CM | POA: Diagnosis not present

## 2015-03-20 ENCOUNTER — Ambulatory Visit (INDEPENDENT_AMBULATORY_CARE_PROVIDER_SITE_OTHER): Payer: BLUE CROSS/BLUE SHIELD | Admitting: Family Medicine

## 2015-03-20 ENCOUNTER — Ambulatory Visit (INDEPENDENT_AMBULATORY_CARE_PROVIDER_SITE_OTHER): Payer: BLUE CROSS/BLUE SHIELD

## 2015-03-20 VITALS — BP 110/68 | HR 66 | Temp 97.7°F | Resp 18 | Ht 72.0 in | Wt 232.0 lb

## 2015-03-20 DIAGNOSIS — M25562 Pain in left knee: Secondary | ICD-10-CM

## 2015-03-20 DIAGNOSIS — M25462 Effusion, left knee: Secondary | ICD-10-CM | POA: Diagnosis not present

## 2015-03-20 MED ORDER — MELOXICAM 7.5 MG PO TABS: 7.5000 mg | ORAL_TABLET | Freq: Every day | ORAL | Status: DC

## 2015-03-20 MED ORDER — HYDROCODONE-ACETAMINOPHEN 5-325 MG PO TABS: 1.0000 | ORAL_TABLET | Freq: Four times a day (QID) | ORAL | Status: DC | PRN

## 2015-03-20 NOTE — Progress Notes (Signed)
Subjective:  This chart was scribed for Merri Ray, MD by Mercy Moore, Medial Scribe. This patient was seen in room 6 and the patient's care was started at 10:35 AM.    Patient ID: Francisco [name], male    DOB: November 20, 1956, 59 y.o.   MRN: [id number] Chief Complaint  Patient presents with  . Knee Pain    left-x3 mth worse yesterday     HPI HPI Comments: Francisco [name] is a 59 y.o. male who presents to the [facility] complaining of progressively worsening left knee pain, ongoing for three months. Patient states that his pain did not become "debilitating" until yesterday when bearing weight, movement and ambulation became very painful. Patient reports a fall in his back yard four days, but states that "it was on soft ground" and he did not notice any resulting pain. Patient denies initial injury as causation. Patient denies increased activity recently or at of onset of symptoms. Patient reports mild swelling and pain to medial aspect of left knee. Patient reports treatment with Aspirin, Aleve and rest, yesterday; he uncertain of effectiveness given that he was resting the knee. Patient has not been been treating his pain with medication over the last three months. Patient denies history of gout or previous injury to his left. Patient is not being followed for his knee pain, but states he indicates Dr. Ouida Sills as his PCP.     Patient Active Problem List   Diagnosis Date Noted  . Hepatic cirrhosis 01/31/2015  . Varices, esophageal   . Esophageal varices with bleeding 07/19/2014  . Portal hypertensive gastropathy 07/19/2014  . Chronic hepatitis C without hepatic coma 07/19/2014  . Personal history of colonic polyps 07/19/2014  . Iron deficiency anemia due to chronic blood loss 07/19/2014   Past Medical History  Diagnosis Date  . Allergy   . Fatty liver 2011    Abd [name]  . Diverticulosis 2008  . Esophageal varices with bleeding 07/19/2014  . Portal  hypertensive gastropathy 07/19/2014  . Chronic hepatitis C 07/19/2014  . Iron deficiency anemia secondary to blood loss (chronic) 07/19/2014  . Benign neoplasm of colon 07/19/2014    07/19/14 - 2 adenomas - repeat colonoscopy 2020   . Cirrhosis    Past Surgical History  Procedure Laterality Date  . Orif zygomatic fracture Right   . Thyroidectomy  2012  . Colonoscopy  2008  . Radius synostosis proximal resection w/ allograft      at the elbow  . Esophagogastroduodenoscopy N/A 07/19/2014    Procedure: ESOPHAGOGASTRODUODENOSCOPY (EGD);  Surgeon: Gatha Mayer, MD;  Location: [facility] ENDOSCOPY;  Service: Endoscopy;  Laterality: N/A;  . Colonoscopy N/A 07/19/2014    Procedure: COLONOSCOPY;  Surgeon: Gatha Mayer, MD;  Location: [facility];  Service: Endoscopy;  Laterality: N/A;  . Esophagogastroduodenoscopy N/A 09/05/2014    Procedure: ESOPHAGOGASTRODUODENOSCOPY (EGD);  Surgeon: Gatha Mayer, MD;  Location: [facility] ENDOSCOPY;  Service: Endoscopy;  Laterality: N/A;  . Esophagogastroduodenoscopy N/A 10/17/2014    Procedure: ESOPHAGOGASTRODUODENOSCOPY (EGD);  Surgeon: Gatha Mayer, MD;  Location: [facility] ENDOSCOPY;  Service: Endoscopy;  Laterality: N/A;  . Gastric varices banding N/A 10/17/2014    Procedure: GASTRIC VARICES BANDING;  Surgeon: Gatha Mayer, MD;  Location: [facility] ENDOSCOPY;  Service: Endoscopy;  Laterality: N/A;  . Esophagogastroduodenoscopy N/A 12/19/2014    Procedure: ESOPHAGOGASTRODUODENOSCOPY (EGD);  Surgeon: Gatha Mayer, MD;  Location: [facility] ENDOSCOPY;  Service: Endoscopy;  Laterality: N/A;   No Known Allergies Prior to  Admission medications   Medication Sig Start Date End Date Taking? Authorizing Provider  Multiple Vitamin (MULTIVITAMIN) tablet Take 1 tablet by mouth daily.   Yes Historical Provider, MD  ferrous sulfate 325 (65 FE) MG tablet Take 325 mg by mouth daily with breakfast.    Historical Provider, MD   History   Social History  . Marital Status: Divorced    Spouse Name:  N/A  . Number of Children: 1  . Years of Education: N/A   Occupational History  . operations officer     [employer]   Social History Main Topics  . Smoking status: Current Every Day Smoker -- 0.50 packs/day for 40 years    Types: Cigarettes    Start date: 05/08/1974  . Smokeless tobacco: Never Used     Comment: cutting back  . Alcohol Use: No     Comment: quit 08/27/14  . Drug Use: No  . Sexual Activity: Not on file   Other Topics Concern  . 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 He has about 4 beers per day and 2 caffeinated beverages per day.      Review of Systems  Constitutional: Negative for fever and chills.  Musculoskeletal: Positive for arthralgias.       Objective:   Physical Exam  Constitutional: He is oriented to person, place, and time. He appears well-developed and well-nourished. No distress.  HENT:  Head: Normocephalic and atraumatic.  Eyes: EOM are normal.  Neck: Neck supple. No tracheal deviation present.  Cardiovascular: Normal rate.   Pulmonary/Chest: Effort normal. No respiratory distress.  Musculoskeletal: He exhibits tenderness.  Left knee: minimal warmth, approximately 1+ effusion on left with warmth over medal joint and into supra patellar bursa. Tender along medial joint line. Patella nontender, patella tendon non tender, tibular head non tender. Flexion to approximately  70deg, full extension. Slight discomfort with valgus testing, but apparent laxity. Slight discomfort with varus testing, no apparent laxity. Positive McMurray. Negative posterior and anterior drawer.   Neurological: He is alert and oriented to person, place, and time.  Skin: Skin is warm and dry.  Psychiatric: He has a normal mood and affect. His behavior is normal.  Nursing note and vitals reviewed.    Filed Vitals:   03/20/15 0929  BP:  110/68  Pulse: 66  Temp: 97.7 F (36.5 C)  TempSrc: Oral  Resp: 18  Height: 6' (1.829 m)  Weight: 232 lb (105.235 kg)  SpO2: 95%   [facility] reading (PRIMARY) by  Dr. Carlota Raspberry: left knee with no discrete fracture or apparent significant digenerative changes.      Assessment & Plan:   Francisco [name] is a 59 y.o. male Left knee pain - Plan: DG Knee Complete 4 Views Left, meloxicam (MOBIC) 7.5 MG tablet, HYDROcodone-acetaminophen (NORCO/VICODIN) 5-325 MG per tablet  Knee swelling, left - Plan: meloxicam (MOBIC) 7.5 MG tablet, HYDROcodone-acetaminophen (NORCO/VICODIN) 5-325 MG per tablet   -possible degenerative meniscal tear.  No mechanical sx's at present.  Will try mobic, brace, elevate, and if not improving in next 10 days (or worsening sooner - including any increased WB difficulty) would obtain MRI sooner.   -rtc precautions discussed.   Meds ordered this encounter  Medications  . meloxicam (MOBIC) 7.5 MG tablet    Sig: Take 1 tablet (7.5 mg total) by mouth daily.  Dispense:  30 tablet    Refill:  0  . HYDROcodone-acetaminophen (NORCO/VICODIN) 5-325 MG per tablet    Sig: Take 1 tablet by mouth every 6 (six) hours as needed for moderate pain.    Dispense:  15 tablet    Refill:  0   Patient Instructions  Wear hinged knee brace, ice and elevate as seated. meloxicam once per day as needed (do not combine with other over the counter pain relievers), hydrocodone if needed for more severe pain if needed.  If pain with putting weight on knee, use crutches until follow up (bring ones from home to make sure they fit you ok).  If the pain and swelling is not improving in next week to 10 days (or any worsening sooner) we can order an MRI.  Return to the clinic or go to the nearest emergency room if any of your symptoms worsen or new symptoms occur.  Knee Pain The knee is the complex joint between your thigh and your lower leg. It is made up of bones, tendons, ligaments, and cartilage. The  bones that make up the knee are:  The femur in the thigh.  The tibia and fibula in the lower leg.  The patella or kneecap riding in the groove on the lower femur. CAUSES  Knee pain is a common complaint with many causes. A few of these causes are:  Injury, such as:  A ruptured ligament or tendon injury.  Torn cartilage.  Medical conditions, such as:  Gout  Arthritis  Infections  Overuse, over training, or overdoing a physical activity. Knee pain can be minor or severe. Knee pain can accompany debilitating injury. Minor knee problems often respond well to self-care measures or get well on their own. More serious injuries may need medical intervention or even surgery. SYMPTOMS The knee is complex. Symptoms of knee problems can vary widely. Some of the problems are:  Pain with movement and weight bearing.  Swelling and tenderness.  Buckling of the knee.  Inability to straighten or extend your knee.  Your knee locks and you cannot straighten it.  Warmth and redness with pain and fever.  Deformity or dislocation of the kneecap. DIAGNOSIS  Determining what is wrong may be very straight forward such as when there is an injury. It can also be challenging because of the complexity of the knee. Tests to make a diagnosis may include:  Your caregiver taking a history and doing a physical exam.  Routine X-rays can be used to rule out other problems. X-rays will not reveal a cartilage tear. Some injuries of the knee can be diagnosed by:  Arthroscopy a surgical technique by which a small video camera is inserted through tiny incisions on the sides of the knee. This procedure is used to examine and repair internal knee joint problems. Tiny instruments can be used during arthroscopy to repair the torn knee cartilage (meniscus).  Arthrography is a radiology technique. A contrast liquid is directly injected into the knee joint. Internal structures of the knee joint then become visible  on X-ray film.  An MRI scan is a non X-ray radiology procedure in which magnetic fields and a computer produce two- or three-dimensional images of the inside of the knee. Cartilage tears are often visible using an MRI scanner. MRI scans have largely replaced arthrography in diagnosing cartilage tears of the knee.  Blood work.  Examination of the fluid that helps to lubricate the knee joint (synovial fluid). This is done by taking a  sample out using a needle and a syringe. TREATMENT The treatment of knee problems depends on the cause. Some of these treatments are:  Depending on the injury, proper casting, splinting, surgery, or physical therapy care will be needed.  Give yourself adequate recovery time. Do not overuse your joints. If you begin to get sore during workout routines, back off. Slow down or do fewer repetitions.  For repetitive activities such as cycling or running, maintain your strength and nutrition.  Alternate muscle groups. For example, if you are a weight lifter, work the upper body on one day and the lower body the next.  Either tight or weak muscles do not give the proper support for your knee. Tight or weak muscles do not absorb the stress placed on the knee joint. Keep the muscles surrounding the knee strong.  Take care of mechanical problems.  If you have flat feet, orthotics or special shoes may help. See your caregiver if you need help.  Arch supports, sometimes with wedges on the inner or outer aspect of the heel, can help. These can shift pressure away from the side of the knee most bothered by osteoarthritis.  A brace called an "unloader" brace also may be used to help ease the pressure on the most arthritic side of the knee.  If your caregiver has prescribed crutches, braces, wraps or ice, use as directed. The acronym for this is PRICE. This means protection, rest, ice, compression, and elevation.  Nonsteroidal anti-inflammatory drugs (NSAIDs), can help  relieve pain. But if taken immediately after an injury, they may actually increase swelling. Take NSAIDs with food in your stomach. Stop them if you develop stomach problems. Do not take these if you have a history of ulcers, stomach pain, or bleeding from the bowel. Do not take without your caregiver's approval if you have problems with fluid retention, heart failure, or kidney problems.  For ongoing knee problems, physical therapy may be helpful.  Glucosamine and chondroitin are over-the-counter dietary supplements. Both may help relieve the pain of osteoarthritis in the knee. These medicines are different from the usual anti-inflammatory drugs. Glucosamine may decrease the rate of cartilage destruction.  Injections of a corticosteroid drug into your knee joint may help reduce the symptoms of an arthritis flare-up. They may provide pain relief that lasts a few months. You may have to wait a few months between injections. The injections do have a small increased risk of infection, water retention, and elevated blood sugar levels.  Hyaluronic acid injected into damaged joints may ease pain and provide lubrication. These injections may work by reducing inflammation. A series of shots may give relief for as long as 6 months.  Topical painkillers. Applying certain ointments to your skin may help relieve the pain and stiffness of osteoarthritis. Ask your pharmacist for suggestions. Many over the-counter products are approved for temporary relief of arthritis pain.  In some countries, doctors often prescribe topical NSAIDs for relief of chronic conditions such as arthritis and tendinitis. A review of treatment with NSAID creams found that they worked as well as oral medications but without the serious side effects. PREVENTION  Maintain a healthy weight. Extra pounds put more strain on your joints.  Get strong, stay limber. Weak muscles are a common cause of knee injuries. Stretching is important. Include  flexibility exercises in your workouts.  Be smart about exercise. If you have osteoarthritis, chronic knee pain or recurring injuries, you may need to change the way you exercise. This does not  mean you have to stop being active. If your knees ache after jogging or playing basketball, consider switching to swimming, water aerobics, or other low-impact activities, at least for a few days a week. Sometimes limiting high-impact activities will provide relief.  Make sure your shoes fit well. Choose footwear that is right for your sport.  Protect your knees. Use the proper gear for knee-sensitive activities. Use kneepads when playing volleyball or laying carpet. Buckle your seat belt every time you drive. Most shattered kneecaps occur in car accidents.  Rest when you are tired. SEEK MEDICAL CARE IF:  You have knee pain that is continual and does not seem to be getting better.  SEEK IMMEDIATE MEDICAL CARE IF:  Your knee joint feels hot to the touch and you have a high fever. MAKE SURE YOU:   Understand these instructions.  Will watch your condition.  Will get help right away if you are not doing well or get worse. Document Released: 10/04/2007 Document Revised: 02/29/2012 Document Reviewed: 10/04/2007 [facility] Patient Information 2015 [place], [place]. This information is not intended to replace advice given to you by your health care provider. Make sure you discuss any questions you have with your health care provider.     I personally performed the services described in this documentation, which was scribed in my presence. The recorded information has been reviewed and considered, and addended by me as needed.

## 2015-03-20 NOTE — Patient Instructions (Signed)
Wear hinged knee brace, ice and elevate as seated. meloxicam once per day as needed (do not combine with other over the counter pain relievers), hydrocodone if needed for more severe pain if needed.  If pain with putting weight on knee, use crutches until follow up (bring ones from home to make sure they fit you ok).  If the pain and swelling is not improving in next week to 10 days (or any worsening sooner) we can order an MRI.  Return to the clinic or go to the nearest emergency room if any of your symptoms worsen or new symptoms occur.  Knee Pain The knee is the complex joint between your thigh and your lower leg. It is made up of bones, tendons, ligaments, and cartilage. The bones that make up the knee are:  The femur in the thigh.  The tibia and fibula in the lower leg.  The patella or kneecap riding in the groove on the lower femur. CAUSES  Knee pain is a common complaint with many causes. A few of these causes are:  Injury, such as:  A ruptured ligament or tendon injury.  Torn cartilage.  Medical conditions, such as:  Gout  Arthritis  Infections  Overuse, over training, or overdoing a physical activity. Knee pain can be minor or severe. Knee pain can accompany debilitating injury. Minor knee problems often respond well to self-care measures or get well on their own. More serious injuries may need medical intervention or even surgery. SYMPTOMS The knee is complex. Symptoms of knee problems can vary widely. Some of the problems are:  Pain with movement and weight bearing.  Swelling and tenderness.  Buckling of the knee.  Inability to straighten or extend your knee.  Your knee locks and you cannot straighten it.  Warmth and redness with pain and fever.  Deformity or dislocation of the kneecap. DIAGNOSIS  Determining what is wrong may be very straight forward such as when there is an injury. It can also be challenging because of the complexity of the knee. Tests to  make a diagnosis may include:  Your caregiver taking a history and doing a physical exam.  Routine X-rays can be used to rule out other problems. X-rays will not reveal a cartilage tear. Some injuries of the knee can be diagnosed by:  Arthroscopy a surgical technique by which a small video camera is inserted through tiny incisions on the sides of the knee. This procedure is used to examine and repair internal knee joint problems. Tiny instruments can be used during arthroscopy to repair the torn knee cartilage (meniscus).  Arthrography is a radiology technique. A contrast liquid is directly injected into the knee joint. Internal structures of the knee joint then become visible on X-ray film.  An MRI scan is a non X-ray radiology procedure in which magnetic fields and a computer produce two- or three-dimensional images of the inside of the knee. Cartilage tears are often visible using an MRI scanner. MRI scans have largely replaced arthrography in diagnosing cartilage tears of the knee.  Blood work.  Examination of the fluid that helps to lubricate the knee joint (synovial fluid). This is done by taking a sample out using a needle and a syringe. TREATMENT The treatment of knee problems depends on the cause. Some of these treatments are:  Depending on the injury, proper casting, splinting, surgery, or physical therapy care will be needed.  Give yourself adequate recovery time. Do not overuse your joints. If you begin to get  sore during workout routines, back off. Slow down or do fewer repetitions.  For repetitive activities such as cycling or running, maintain your strength and nutrition.  Alternate muscle groups. For example, if you are a weight lifter, work the upper body on one day and the lower body the next.  Either tight or weak muscles do not give the proper support for your knee. Tight or weak muscles do not absorb the stress placed on the knee joint. Keep the muscles surrounding the  knee strong.  Take care of mechanical problems.  If you have flat feet, orthotics or special shoes may help. See your caregiver if you need help.  Arch supports, sometimes with wedges on the inner or outer aspect of the heel, can help. These can shift pressure away from the side of the knee most bothered by osteoarthritis.  A brace called an "unloader" brace also may be used to help ease the pressure on the most arthritic side of the knee.  If your caregiver has prescribed crutches, braces, wraps or ice, use as directed. The acronym for this is PRICE. This means protection, rest, ice, compression, and elevation.  Nonsteroidal anti-inflammatory drugs (NSAIDs), can help relieve pain. But if taken immediately after an injury, they may actually increase swelling. Take NSAIDs with food in your stomach. Stop them if you develop stomach problems. Do not take these if you have a history of ulcers, stomach pain, or bleeding from the bowel. Do not take without your caregiver's approval if you have problems with fluid retention, heart failure, or kidney problems.  For ongoing knee problems, physical therapy may be helpful.  Glucosamine and chondroitin are over-the-counter dietary supplements. Both may help relieve the pain of osteoarthritis in the knee. These medicines are different from the usual anti-inflammatory drugs. Glucosamine may decrease the rate of cartilage destruction.  Injections of a corticosteroid drug into your knee joint may help reduce the symptoms of an arthritis flare-up. They may provide pain relief that lasts a few months. You may have to wait a few months between injections. The injections do have a small increased risk of infection, water retention, and elevated blood sugar levels.  Hyaluronic acid injected into damaged joints may ease pain and provide lubrication. These injections may work by reducing inflammation. A series of shots may give relief for as long as 6  months.  Topical painkillers. Applying certain ointments to your skin may help relieve the pain and stiffness of osteoarthritis. Ask your pharmacist for suggestions. Many over the-counter products are approved for temporary relief of arthritis pain.  In some countries, doctors often prescribe topical NSAIDs for relief of chronic conditions such as arthritis and tendinitis. A review of treatment with NSAID creams found that they worked as well as oral medications but without the serious side effects. PREVENTION  Maintain a healthy weight. Extra pounds put more strain on your joints.  Get strong, stay limber. Weak muscles are a common cause of knee injuries. Stretching is important. Include flexibility exercises in your workouts.  Be smart about exercise. If you have osteoarthritis, chronic knee pain or recurring injuries, you may need to change the way you exercise. This does not mean you have to stop being active. If your knees ache after jogging or playing basketball, consider switching to swimming, water aerobics, or other low-impact activities, at least for a few days a week. Sometimes limiting high-impact activities will provide relief.  Make sure your shoes fit well. Choose footwear that is right for your  sport.  Protect your knees. Use the proper gear for knee-sensitive activities. Use kneepads when playing volleyball or laying carpet. Buckle your seat belt every time you drive. Most shattered kneecaps occur in car accidents.  Rest when you are tired. SEEK MEDICAL CARE IF:  You have knee pain that is continual and does not seem to be getting better.  SEEK IMMEDIATE MEDICAL CARE IF:  Your knee joint feels hot to the touch and you have a high fever. MAKE SURE YOU:   Understand these instructions.  Will watch your condition.  Will get help right away if you are not doing well or get worse. Document Released: 10/04/2007 Document Revised: 02/29/2012 Document Reviewed:  10/04/2007 [facility] Patient Information 2015 [place], [place]. This information is not intended to replace advice given to you by your health care provider. Make sure you discuss any questions you have with your health care provider.

## 2015-04-16 ENCOUNTER — Other Ambulatory Visit: Payer: Self-pay | Admitting: Family Medicine

## 2015-04-17 NOTE — Telephone Encounter (Signed)
Do you want to give a RF at all, or have pt RTC first?

## 2015-04-18 NOTE — Telephone Encounter (Signed)
Can refill once, but if not improving in next week or two - rtc.

## 2015-04-25 ENCOUNTER — Ambulatory Visit (HOSPITAL_COMMUNITY)
Admission: RE | Admit: 2015-04-25 | Discharge: 2015-04-25 | Disposition: A | Discharge status: Home / Self Care | Payer: BLUE CROSS/BLUE SHIELD | Source: Ambulatory Visit | Attending: Internal Medicine | Admitting: Internal Medicine

## 2015-04-25 DIAGNOSIS — B1920 Unspecified viral hepatitis C without hepatic coma: Secondary | ICD-10-CM | POA: Insufficient documentation

## 2015-04-25 DIAGNOSIS — K7460 Unspecified cirrhosis of liver: Secondary | ICD-10-CM | POA: Insufficient documentation

## 2015-04-25 DIAGNOSIS — K8020 Calculus of gallbladder without cholecystitis without obstruction: Secondary | ICD-10-CM | POA: Insufficient documentation

## 2015-05-02 ENCOUNTER — Encounter: Payer: Self-pay | Admitting: Internal Medicine

## 2015-05-02 ENCOUNTER — Ambulatory Visit (INDEPENDENT_AMBULATORY_CARE_PROVIDER_SITE_OTHER): Payer: BLUE CROSS/BLUE SHIELD | Admitting: Internal Medicine

## 2015-05-02 VITALS — Temp 98.2°F | Wt 230.0 lb

## 2015-05-02 DIAGNOSIS — B182 Chronic viral hepatitis C: Secondary | ICD-10-CM

## 2015-05-02 DIAGNOSIS — K7460 Unspecified cirrhosis of liver: Secondary | ICD-10-CM

## 2015-05-02 LAB — CBC WITH DIFFERENTIAL/PLATELET
BASOS PCT: 1 % (ref 0–1)
Basophils Absolute: 0 10*3/uL (ref 0.0–0.1)
EOS ABS: 0.1 10*3/uL (ref 0.0–0.7)
Eosinophils Relative: 4 % (ref 0–5)
HCT: 38.7 % — ABNORMAL LOW (ref 39.0–52.0)
Hemoglobin: 13.2 g/dL (ref 13.0–17.0)
Lymphocytes Relative: 32 % (ref 12–46)
Lymphs Abs: 1.2 10*3/uL (ref 0.7–4.0)
MCH: 30.2 pg (ref 26.0–34.0)
MCHC: 34.1 g/dL (ref 30.0–36.0)
MCV: 88.6 fL (ref 78.0–100.0)
MONOS PCT: 9 % (ref 3–12)
MPV: 8.8 fL (ref 8.6–12.4)
Monocytes Absolute: 0.3 10*3/uL (ref 0.1–1.0)
Neutro Abs: 1.9 10*3/uL (ref 1.7–7.7)
Neutrophils Relative %: 54 % (ref 43–77)
Platelets: 79 10*3/uL — ABNORMAL LOW (ref 150–400)
RBC: 4.37 MIL/uL (ref 4.22–5.81)
RDW: 14.6 % (ref 11.5–15.5)
WBC: 3.6 10*3/uL — ABNORMAL LOW (ref 4.0–10.5)

## 2015-05-02 NOTE — Progress Notes (Signed)
Patient comes in for follow up on HCV therapy after Harvoni.  He is  Lab Results  Component Value Date   HCVGENOTYPE 1a 09/04/2014   with elastography consistent with F4 and initial viral load  Lab Results  Component Value Date   HCVQUANT Not Detected 01/23/2015   .  Patient now completed Harvoni.  Has had no side effects, no complaints.  No missed doses.    No new meds  Filed Vitals:   05/02/15 0856  Temp: 98.2 F (36.8 C)   in no apparent distress HEENT: anicteric Cor RRR and No murmurs clear Bowel sounds are normal, liver is not enlarged, spleen is not enlarged peripheral pulses normal, no pedal edema, no clubbing or cyanosis negative for - jaundice, spider hemangioma, telangiectasia, palmar erythema, ecchymosis and atrophy  A/P: 1) Completed Harvoni for 12 weeks 2) Viral load undetectable after tretament, SVR 12 today.  3) Vaccines - A and B per GI 4) Ultrasound every 6 months Yes.  , had prior to this appt and without concerns.  I will check MRI prior to next visit. 5) Cirrhosis - managed by Dr. Carlean Purl.  EGD this summer.    RTC in 6 months and will do SVR24.

## 2015-05-07 LAB — HEPATITIS C RNA QUANTITATIVE: HCV Quantitative: NOT DETECTED IU/mL (ref <15)

## 2015-05-27 ENCOUNTER — Encounter (HOSPITAL_COMMUNITY): Payer: Self-pay | Admitting: Family Medicine

## 2015-05-27 ENCOUNTER — Other Ambulatory Visit: Payer: Self-pay | Admitting: Family Medicine

## 2015-05-27 ENCOUNTER — Telehealth: Payer: Self-pay | Admitting: Internal Medicine

## 2015-05-27 ENCOUNTER — Emergency Department (HOSPITAL_COMMUNITY)
Admission: EM | Admit: 2015-05-27 | Discharge: 2015-05-27 | Discharge status: Against Medical Advice | Payer: BLUE CROSS/BLUE SHIELD | Attending: Emergency Medicine | Admitting: Emergency Medicine

## 2015-05-27 DIAGNOSIS — R55 Syncope and collapse: Secondary | ICD-10-CM | POA: Diagnosis not present

## 2015-05-27 DIAGNOSIS — R110 Nausea: Secondary | ICD-10-CM | POA: Diagnosis not present

## 2015-05-27 DIAGNOSIS — Z720 Tobacco use: Secondary | ICD-10-CM | POA: Insufficient documentation

## 2015-05-27 LAB — COMPREHENSIVE METABOLIC PANEL WITH GFR
ALT: 26 U/L (ref 17–63)
AST: 28 U/L (ref 15–41)
Albumin: 3.4 g/dL — ABNORMAL LOW (ref 3.5–5.0)
Alkaline Phosphatase: 88 U/L (ref 38–126)
Anion gap: 7 (ref 5–15)
BUN: 26 mg/dL — ABNORMAL HIGH (ref 6–20)
CO2: 20 mmol/L — ABNORMAL LOW (ref 22–32)
Calcium: 9.5 mg/dL (ref 8.9–10.3)
Chloride: 109 mmol/L (ref 101–111)
Creatinine, Ser: 0.75 mg/dL (ref 0.61–1.24)
GFR calc Af Amer: >60 mL/min
GFR calc non Af Amer: >60 mL/min
Glucose, Bld: 157 mg/dL — ABNORMAL HIGH (ref 65–99)
Potassium: 3.8 mmol/L (ref 3.5–5.1)
Sodium: 136 mmol/L (ref 135–145)
Total Bilirubin: 0.7 mg/dL (ref 0.3–1.2)
Total Protein: 7 g/dL (ref 6.5–8.1)

## 2015-05-27 LAB — ABO/RH: ABO/RH(D): O POS

## 2015-05-27 LAB — TYPE AND SCREEN
ABO/RH(D): O POS
Antibody Screen: NEGATIVE

## 2015-05-27 LAB — CBC
HCT: 30 % — ABNORMAL LOW (ref 39.0–52.0)
Hemoglobin: 10.4 g/dL — ABNORMAL LOW (ref 13.0–17.0)
MCH: 30.3 pg (ref 26.0–34.0)
MCHC: 34.7 g/dL (ref 30.0–36.0)
MCV: 87.5 fL (ref 78.0–100.0)
Platelets: 97 K/uL — ABNORMAL LOW (ref 150–400)
RBC: 3.43 MIL/uL — ABNORMAL LOW (ref 4.22–5.81)
RDW: 14.3 % (ref 11.5–15.5)
WBC: 6.7 K/uL (ref 4.0–10.5)

## 2015-05-27 NOTE — ED Notes (Signed)
Pt here for feeling faint, and black tarry stools with nausea since Saturday.

## 2015-05-27 NOTE — Telephone Encounter (Signed)
Patient reports that on Friday he developed abdominal pain and felt "woozy".  He has had black stools for the last tow days.  He reports that he takes meloxicam about 5 days a week.  He is not on a PPI.  He has a history of melena last year, cirrhosis,  and Hep C he recently completed his Harvoni treatment and his viral load was undetectable.  Last CBC Hgb 13.1 and platelets were 76.  Discussed with Dr. Hilarie Fredrickson as MD of the day in Dr. Celesta Aver absence patient to go to the ER for eval.  Patient notified

## 2015-05-27 NOTE — ED Provider Notes (Signed)
Patient called for room assignment x3 but never answered.  Patient LWBS after triage.  I did not see or evaluate patient.  Larene Pickett, PA-C 05/27/15 [place], MD 05/29/15 [phone]

## 2015-05-27 NOTE — ED Notes (Signed)
Pt called to be taken to room x3. No answer.

## 2015-05-29 NOTE — Telephone Encounter (Signed)
Dr. Carlota Raspberry patient is looking to refill Mobic, do you want to refill again, or have come in for a f/u?

## 2015-05-31 ENCOUNTER — Ambulatory Visit: Payer: BLUE CROSS/BLUE SHIELD | Admitting: Nurse Practitioner

## 2015-05-31 NOTE — Telephone Encounter (Signed)
reviewed phone notes to GI with possible black stools, and recommendationto be seen in ER.  It does not appear he was evaluated in ER. Would not take Mobic or any NSAIDS until cleared by GI to do so. Did not refill Mobic, and should be evaluated for symptoms that he called GI with.

## 2015-06-04 NOTE — Telephone Encounter (Signed)
Contacted pt who stated that the black stools have since cleared up but agreed that he will follow up w/GI and advised him that he should get their OK to take any NSAIDS. Pt agreed.

## 2015-06-26 ENCOUNTER — Ambulatory Visit (INDEPENDENT_AMBULATORY_CARE_PROVIDER_SITE_OTHER): Payer: BLUE CROSS/BLUE SHIELD | Admitting: Family Medicine

## 2015-06-26 ENCOUNTER — Ambulatory Visit (INDEPENDENT_AMBULATORY_CARE_PROVIDER_SITE_OTHER): Payer: BLUE CROSS/BLUE SHIELD

## 2015-06-26 VITALS — BP 110/72 | HR 83 | Temp 98.3°F | Resp 18 | Ht 72.0 in | Wt 227.0 lb

## 2015-06-26 DIAGNOSIS — M545 Low back pain, unspecified: Secondary | ICD-10-CM

## 2015-06-26 DIAGNOSIS — M1990 Unspecified osteoarthritis, unspecified site: Secondary | ICD-10-CM

## 2015-06-26 DIAGNOSIS — M25562 Pain in left knee: Secondary | ICD-10-CM

## 2015-06-26 MED ORDER — OXYCODONE HCL 5 MG PO TABS: 5.0000 mg | ORAL_TABLET | Freq: Four times a day (QID) | ORAL | Status: DC | PRN

## 2015-06-26 MED ORDER — TIZANIDINE HCL 2 MG PO CAPS: 2.0000 mg | ORAL_CAPSULE | Freq: Three times a day (TID) | ORAL | Status: DC | PRN

## 2015-06-26 NOTE — Progress Notes (Signed)
Chief Complaint:  Chief Complaint  Patient presents with  . Back Injury    lower left side sunday  . Back Pain    sunday     HPI: Francisco [name] is a 59 y.o. male who is here for  low back pain for the last several days 4 days. He was helping lift a friend who has MS and also helping someone move. He did not hear anything. He has no numbness weakness or tingling. He does his pain with certain movements. He can come sporadically. It's usually Morse often with sitting to standing and vice versa. But once he is our activities fine and does not have any pain. Again he denies any numbness weakness or tingling or saddle anesthesia or incontinence. He cannot take anti-inflammatory due to  a history of esophageal varices and cirrhosis and diverticulosis.  Past Medical History  Diagnosis Date  . Allergy   . Fatty liver 2011    Abd [name]  . Diverticulosis 2008  . Esophageal varices with bleeding 07/19/2014  . Portal hypertensive gastropathy 07/19/2014  . Chronic hepatitis C 07/19/2014  . Iron deficiency anemia secondary to blood loss (chronic) 07/19/2014  . Benign neoplasm of colon 07/19/2014    07/19/14 - 2 adenomas - repeat colonoscopy 2020   . Cirrhosis    Past Surgical History  Procedure Laterality Date  . Orif zygomatic fracture Right   . Thyroidectomy  2012  . Colonoscopy  2008  . Radius synostosis proximal resection w/ allograft      at the elbow  . Esophagogastroduodenoscopy N/A 07/19/2014    Procedure: ESOPHAGOGASTRODUODENOSCOPY (EGD);  Surgeon: Gatha Mayer, MD;  Location: [facility] ENDOSCOPY;  Service: Endoscopy;  Laterality: N/A;  . Colonoscopy N/A 07/19/2014    Procedure: COLONOSCOPY;  Surgeon: Gatha Mayer, MD;  Location: [place];  Service: Endoscopy;  Laterality: N/A;  . Esophagogastroduodenoscopy N/A 09/05/2014    Procedure: ESOPHAGOGASTRODUODENOSCOPY (EGD);  Surgeon: Gatha Mayer, MD;  Location: [facility] ENDOSCOPY;  Service: Endoscopy;  Laterality: N/A;  .  Esophagogastroduodenoscopy N/A 10/17/2014    Procedure: ESOPHAGOGASTRODUODENOSCOPY (EGD);  Surgeon: Gatha Mayer, MD;  Location: [facility] ENDOSCOPY;  Service: Endoscopy;  Laterality: N/A;  . Gastric varices banding N/A 10/17/2014    Procedure: GASTRIC VARICES BANDING;  Surgeon: Gatha Mayer, MD;  Location: [facility] ENDOSCOPY;  Service: Endoscopy;  Laterality: N/A;  . Esophagogastroduodenoscopy N/A 12/19/2014    Procedure: ESOPHAGOGASTRODUODENOSCOPY (EGD);  Surgeon: Gatha Mayer, MD;  Location: [facility] ENDOSCOPY;  Service: Endoscopy;  Laterality: N/A;   History   Social History  . Marital Status: Divorced    Spouse Name: N/A  . Number of Children: 1  . Years of Education: N/A   Occupational History  . operations officer     [employer]   Social History Main Topics  . Smoking status: Current Every Day Smoker -- 0.50 packs/day for 40 years    Types: Cigarettes    Start date: 05/08/1974  . Smokeless tobacco: Never Used     Comment: cutting back  . Alcohol Use: No     Comment: quit 08/27/14  . Drug Use: No  . Sexual Activity: Not on file   Other Topics Concern  . 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 He has about 4 beers per day and 2 caffeinated beverages per day.   Family  History  Problem Relation Age of Onset  . Crohn's disease Father   . Heart disease Maternal Grandmother   . Alcohol abuse Maternal Grandfather   . Alcohol abuse Maternal Uncle     several  . Cancer Paternal Grandfather     type unknown, mets   No Known Allergies Prior to Admission medications   Medication Sig Start Date End Date Taking? Authorizing Provider  IRON, FERROUS GLUCONATE, PO Take by mouth.   Yes Historical Provider, MD  Multiple Vitamin (MULTIVITAMIN) tablet Take 1 tablet by mouth daily.   Yes Historical Provider, MD     ROS: The patient denies fevers, chills, night sweats,  unintentional weight loss, chest pain, palpitations, wheezing, dyspnea on exertion, nausea, vomiting, abdominal pain, dysuria, hematuria, melena, numbness, weakness, or tingling.   All other systems have been reviewed and were otherwise negative with the exception of those mentioned in the HPI and as above.    PHYSICAL EXAM: Filed Vitals:   06/26/15 1259  BP: 110/72  Pulse: 83  Temp: 98.3 F (36.8 C)  Resp: 18   Filed Vitals:   06/26/15 1259  Height: 6' (1.829 m)  Weight: 227 lb (102.967 kg)   Body mass index is 30.78 kg/(m^2).   General: Alert, no acute distress HEENT:  Normocephalic, atraumatic, oropharynx patent. EOMI, PERRLA Cardiovascular:  Regular rate and rhythm, no rubs murmurs or gallops.  No Carotid bruits, radial pulse intact. No pedal edema.  Respiratory: Clear to auscultation bilaterally.  No wheezes, rales, or rhonchi.  No cyanosis, no use of accessory musculature GI: No organomegaly, abdomen is soft and non-tender, positive bowel sounds.  No masses. Skin: No rashes. Neurologic: Facial musculature symmetric. Psychiatric: Patient is appropriate throughout our interaction. Lymphatic: No cervical lymphadenopathy Musculoskeletal: Gait intact. + paramsk tenderness  Decrease ROM due to pain 5/5 strength, 2/2 DTRs No saddle anesthesia Straight leg negative Hip normal  No deformity, Neg ballotment Diffuse tenderness Decrease AROM, full PROM 5/5 strength, 2/2 DTRs ankle ,  Neg Lachman, + jt line tenderness, neg McMurray L spine normal ROM,  Straight leg negative Hip normal ROM        LABS: Results for orders placed or performed during the hospital encounter of 05/27/15  CBC  Result Value Ref Range   WBC 6.7 4.0 - 10.5 K/uL   RBC 3.43 (L) 4.22 - 5.81 MIL/uL   Hemoglobin 10.4 (L) 13.0 - 17.0 g/dL   HCT 30.0 (L) 39.0 - 52.0 %   MCV 87.5 78.0 - 100.0 fL   MCH 30.3 26.0 - 34.0 pg   MCHC 34.7 30.0 - 36.0 g/dL   RDW 14.3 11.5 - 15.5 %   Platelets 97 (L)  150 - 400 K/uL  Comprehensive metabolic panel  Result Value Ref Range   Sodium 136 135 - 145 mmol/L   Potassium 3.8 3.5 - 5.1 mmol/L   Chloride 109 101 - 111 mmol/L   CO2 20 (L) 22 - 32 mmol/L   Glucose, Bld 157 (H) 65 - 99 mg/dL   BUN 26 (H) 6 - 20 mg/dL   Creatinine, Ser 0.75 0.61 - 1.24 mg/dL   Calcium 9.5 8.9 - 10.3 mg/dL   Total Protein 7.0 6.5 - 8.1 g/dL   Albumin 3.4 (L) 3.5 - 5.0 g/dL   AST 28 15 - 41 U/L   ALT 26 17 - 63 U/L   Alkaline Phosphatase 88 38 - 126 U/L   Total Bilirubin 0.7 0.3 - 1.2 mg/dL   GFR calc non  Af Amer >60 >60 mL/min   GFR calc Af Amer >60 >60 mL/min   Anion gap 7 5 - 15  Type and screen  Result Value Ref Range   ABO/RH(D) O POS    Antibody Screen NEG    Sample Expiration 05/30/2015   ABO/Rh  Result Value Ref Range   ABO/RH(D) O POS      EKG/XRAY:   Primary read interpreted by Dr. Marin Comment at [facility]. DJD   ASSESSMENT/PLAN: Encounter Diagnoses  Name Primary?  . Left-sided low back pain without sciatica Yes  . Arthritis   . Left knee pain    I have asked him if he wanted to get a sterioid injection of his left knee but he prefer a orthopedic surgeon to actually do it. [place] controlled substance DB reviewed, no illegal activities Rx oxycodone Fu with ortho  Gross sideeffects, risk and benefits, and alternatives of medications d/w patient. Patient is aware that all medications have potential sideeffects and we are unable to predict every sideeffect or drug-drug interaction that may occur.  Katelee Schupp, [place], DO 07/04/2015 7:57 AM

## 2015-07-09 ENCOUNTER — Encounter: Payer: Self-pay | Admitting: Internal Medicine

## 2015-11-19 ENCOUNTER — Ambulatory Visit (INDEPENDENT_AMBULATORY_CARE_PROVIDER_SITE_OTHER): Payer: BLUE CROSS/BLUE SHIELD | Admitting: Internal Medicine

## 2015-11-19 ENCOUNTER — Other Ambulatory Visit (INDEPENDENT_AMBULATORY_CARE_PROVIDER_SITE_OTHER): Payer: BLUE CROSS/BLUE SHIELD

## 2015-11-19 ENCOUNTER — Encounter: Payer: Self-pay | Admitting: Internal Medicine

## 2015-11-19 VITALS — BP 100/56 | HR 68 | Ht 71.0 in | Wt 225.2 lb

## 2015-11-19 DIAGNOSIS — K3189 Other diseases of stomach and duodenum: Secondary | ICD-10-CM

## 2015-11-19 DIAGNOSIS — K7030 Alcoholic cirrhosis of liver without ascites: Secondary | ICD-10-CM | POA: Diagnosis not present

## 2015-11-19 DIAGNOSIS — B182 Chronic viral hepatitis C: Secondary | ICD-10-CM

## 2015-11-19 DIAGNOSIS — I8511 Secondary esophageal varices with bleeding: Secondary | ICD-10-CM

## 2015-11-19 DIAGNOSIS — K766 Portal hypertension: Secondary | ICD-10-CM

## 2015-11-19 LAB — CBC WITH DIFFERENTIAL/PLATELET
Basophils Absolute: 0 10*3/uL (ref 0.0–0.1)
Basophils Relative: 0.3 % (ref 0.0–3.0)
EOS PCT: 2.6 % (ref 0.0–5.0)
Eosinophils Absolute: 0.1 10*3/uL (ref 0.0–0.7)
HCT: 34.8 % — ABNORMAL LOW (ref 39.0–52.0)
Hemoglobin: 11.7 g/dL — ABNORMAL LOW (ref 13.0–17.0)
LYMPHS ABS: 1.1 10*3/uL (ref 0.7–4.0)
Lymphocytes Relative: 28.9 % (ref 12.0–46.0)
MCHC: 33.6 g/dL (ref 30.0–36.0)
MCV: 88.5 fl (ref 78.0–100.0)
MONO ABS: 0.4 10*3/uL (ref 0.1–1.0)
Monocytes Relative: 9.1 % (ref 3.0–12.0)
NEUTROS ABS: 2.3 10*3/uL (ref 1.4–7.7)
NEUTROS PCT: 59.1 % (ref 43.0–77.0)
PLATELETS: 111 10*3/uL — AB (ref 150.0–400.0)
RBC: 3.93 Mil/uL — ABNORMAL LOW (ref 4.22–5.81)
RDW: 14.3 % (ref 11.5–15.5)
WBC: 4 10*3/uL (ref 4.0–10.5)

## 2015-11-19 LAB — COMPREHENSIVE METABOLIC PANEL
ALK PHOS: 97 U/L (ref 39–117)
ALT: 23 U/L (ref 0–53)
AST: 22 U/L (ref 0–37)
Albumin: 4 g/dL (ref 3.5–5.2)
BUN: 14 mg/dL (ref 6–23)
CHLORIDE: 107 meq/L (ref 96–112)
CO2: 26 meq/L (ref 19–32)
Calcium: 10 mg/dL (ref 8.4–10.5)
Creatinine, Ser: 0.66 mg/dL (ref 0.40–1.50)
GFR: 130.99 mL/min (ref >60.00)
Glucose, Bld: 83 mg/dL (ref 70–99)
POTASSIUM: 4 meq/L (ref 3.5–5.1)
SODIUM: 138 meq/L (ref 135–145)
TOTAL PROTEIN: 7.5 g/dL (ref 6.0–8.3)
Total Bilirubin: 0.4 mg/dL (ref 0.2–1.2)

## 2015-11-19 LAB — PROTIME-INR
INR: 1.1 ratio — ABNORMAL HIGH (ref 0.8–1.0)
Prothrombin Time: 12.3 s (ref 9.6–13.1)

## 2015-11-19 NOTE — Assessment & Plan Note (Signed)
?   Bleeding ? Beta blocker

## 2015-11-19 NOTE — Patient Instructions (Addendum)
  We will contact you tomorrow about having an EGD at [facility] hopefully for 11/21/15.  We are giving you blank paperwork today to take and fill in when we call you.   Your physician has requested that you go to the basement for lab work before leaving today.    I appreciate the opportunity to care for you. Silvano Rusk, MD, [facility]    11/20/15 Spoke with patient and informed him that EGD will be 11/21/15 at 1:30, arrive at 12:00noon at [facility] Endo [facility].  He verbalized understanding date/times.

## 2015-11-19 NOTE — Assessment & Plan Note (Signed)
?   Bleeding again

## 2015-11-19 NOTE — Assessment & Plan Note (Signed)
Recheck labs 

## 2015-11-19 NOTE — Progress Notes (Signed)
Subjective:    Patient ID: Francisco [name], male    DOB: 03-26-1956, 59 y.o.   MRN: [id number] Cc: bleeding HPI Has hx esophageal varices and portal gastropathy with prior melena - has had banding - last EGD 1 yr ago - had done well but a few episodes of 2-3 d of melena, last about 1-2 weeks ago. Has not f/u ID for subsequent liver imaging and HCV testing though may be just about time to do so. Feels weak and tired. Remains off EtOH.  No Known Allergies Outpatient Prescriptions Prior to Visit  Medication Sig Dispense Refill  . Multiple Vitamin (MULTIVITAMIN) tablet Take 1 tablet by mouth daily.    . IRON, FERROUS GLUCONATE, PO Take by mouth.    . oxyCODONE (OXY IR/ROXICODONE) 5 MG immediate release tablet Take 1 tablet (5 mg total) by mouth every 6 (six) hours as needed for severe pain (use with stool softener, can cause constipation.). 30 tablet 0  . tizanidine (ZANAFLEX) 2 MG capsule Take 1 capsule (2 mg total) by mouth 3 (three) times daily as needed for muscle spasms. 30 capsule 0   No facility-administered medications prior to visit.   Past Medical History  Diagnosis Date  . Allergy   . Fatty liver 2011    Abd [name]  . Diverticulosis 2008  . Esophageal varices with bleeding ([place]) 07/19/2014  . Portal hypertensive gastropathy 07/19/2014  . Chronic hepatitis C ([place]) 07/19/2014  . Iron deficiency anemia secondary to blood loss (chronic) 07/19/2014  . Benign neoplasm of colon 07/19/2014    07/19/14 - 2 adenomas - repeat colonoscopy 2020   . Cirrhosis ([place])   . Arthritis    Past Surgical History  Procedure Laterality Date  . Orif zygomatic fracture Right   . Thyroidectomy  2012  . Colonoscopy  2008  . Radius synostosis proximal resection w/ allograft      at the elbow  . Esophagogastroduodenoscopy N/A 07/19/2014    Procedure: ESOPHAGOGASTRODUODENOSCOPY (EGD);  Surgeon: Gatha Mayer, MD;  Location: [facility] ENDOSCOPY;  Service: Endoscopy;  Laterality: N/A;  . Colonoscopy N/A  07/19/2014    Procedure: COLONOSCOPY;  Surgeon: Gatha Mayer, MD;  Location: [place];  Service: Endoscopy;  Laterality: N/A;  . Esophagogastroduodenoscopy N/A 09/05/2014    Procedure: ESOPHAGOGASTRODUODENOSCOPY (EGD);  Surgeon: Gatha Mayer, MD;  Location: [facility] ENDOSCOPY;  Service: Endoscopy;  Laterality: N/A;  . Esophagogastroduodenoscopy N/A 10/17/2014    Procedure: ESOPHAGOGASTRODUODENOSCOPY (EGD);  Surgeon: Gatha Mayer, MD;  Location: [facility] ENDOSCOPY;  Service: Endoscopy;  Laterality: N/A;  . Gastric varices banding N/A 10/17/2014    Procedure: GASTRIC VARICES BANDING;  Surgeon: Gatha Mayer, MD;  Location: [facility] ENDOSCOPY;  Service: Endoscopy;  Laterality: N/A;  . Esophagogastroduodenoscopy N/A 12/19/2014    Procedure: ESOPHAGOGASTRODUODENOSCOPY (EGD);  Surgeon: Gatha Mayer, MD;  Location: [facility] ENDOSCOPY;  Service: Endoscopy;  Laterality: N/A;   Social History   Social History  . Marital Status: Divorced    Spouse Name: N/A  . Number of Children: 1  . Years of Education: N/A   Occupational History  . operations officer     distribution  [employer]   Social History Main Topics  . Smoking status: Current Every Day Smoker -- 0.50 packs/day for 40 years    Types: Cigarettes    Start date: 05/08/1974  . Smokeless tobacco: Never Used     Comment: cutting back  . Alcohol Use: 6.0 oz/week    10 Cans of beer per week  Comment: quit 08/27/14  . Drug Use: No  . Sexual Activity: Not Asked   Other Topics Concern  . 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 He has about 4 beers per day and 2 caffeinated beverages per day.   Family History  Problem Relation Age of Onset  . Crohn's disease Father   . Heart disease Maternal Grandmother   . Alcohol abuse Maternal Grandfather   . Alcohol abuse Maternal Uncle     several  . Cancer Paternal Grandfather     type unknown,  mets       Review of Systems As above    Objective:   Physical Exam  @BP  100/56 mmHg  Pulse 68  Ht 5\' 11"  (1.803 m)  Wt 225 lb 4 oz (102.173 kg)  BMI 31.43 kg/m2@  General:  Well-developed, well-nourished and in no acute distress Eyes:  anicteric. ENT:   Mouth and posterior pharynx free of lesions.  Neck:   supple w/o thyromegaly or mass.  Lungs: Clear to auscultation bilaterally. Heart:  S1S2, no rubs, murmurs, gallops. Abdomen:  soft, non-tender, no hepatosplenomegaly, + ssmall umbilical hernia hernia, or mass and BS+.  Rectal: Lymph:  no cervical or supraclavicular adenopathy. Extremities:   no edema, cyanosis or clubbing Skin   no rash. Neuro:  A&O x 3.  Psych:  appropriate mood and  Affect.   Data Reviewed: As per HPI    Assessment & Plan:   Hepatic cirrhosis Labs   Chronic hepatitis C without hepatic coma Recheck labs  Esophageal varices with bleeding ? Bleeding again  Portal hypertensive gastropathy ? Bleeding ? Beta blocker    EGD will be done - The risks and benefits as well as alternatives of endoscopic procedure(s) have been discussed and reviewed. All questions answered. The patient agrees to proceed.

## 2015-11-19 NOTE — Assessment & Plan Note (Signed)
Labs

## 2015-11-20 ENCOUNTER — Encounter: Payer: Self-pay | Admitting: Internal Medicine

## 2015-11-20 ENCOUNTER — Encounter (HOSPITAL_COMMUNITY): Payer: Self-pay | Admitting: *Deleted

## 2015-11-20 NOTE — Progress Notes (Signed)
Unable to bill pt for 30 minute SDW-pre-op call; please bill on day of procedure. Pt denies SOB, chest pain, and being under the care of a cardiologist. Pt denies having a stress test, echo and cardiac cath. Pt denies having an EKG and chest x ray within the last year. Pt had labs (epic) on 11/19/15. Pt made aware to stop taking Aspirin, vitamins, fish oil and herbal medications. Do not take any NSAIDs ie: Ibuprofen, Advil, Naproxen or any medication containing Aspirin. Pt verbalized understanding of all pre-op instructions and stated " I had this procedure around four times this year."

## 2015-11-20 NOTE — Progress Notes (Signed)
Quick Note:  Labs look good except slightly low Hgb and PLY ______

## 2015-11-21 ENCOUNTER — Ambulatory Visit (HOSPITAL_COMMUNITY): Payer: BLUE CROSS/BLUE SHIELD | Admitting: Anesthesiology

## 2015-11-21 ENCOUNTER — Encounter (HOSPITAL_COMMUNITY): Payer: Self-pay | Admitting: *Deleted

## 2015-11-21 ENCOUNTER — Ambulatory Visit (HOSPITAL_COMMUNITY)
Admission: RE | Admit: 2015-11-21 | Discharge: 2015-11-21 | Disposition: A | Discharge status: Home / Self Care | Payer: BLUE CROSS/BLUE SHIELD | Source: Ambulatory Visit | Attending: Internal Medicine | Admitting: Internal Medicine

## 2015-11-21 ENCOUNTER — Encounter (HOSPITAL_COMMUNITY)
Admission: RE | Disposition: A | Discharge status: Home / Self Care | Payer: Self-pay | Source: Ambulatory Visit | Attending: Internal Medicine

## 2015-11-21 DIAGNOSIS — F17210 Nicotine dependence, cigarettes, uncomplicated: Secondary | ICD-10-CM | POA: Insufficient documentation

## 2015-11-21 DIAGNOSIS — I8511 Secondary esophageal varices with bleeding: Secondary | ICD-10-CM

## 2015-11-21 DIAGNOSIS — M1990 Unspecified osteoarthritis, unspecified site: Secondary | ICD-10-CM | POA: Insufficient documentation

## 2015-11-21 DIAGNOSIS — K766 Portal hypertension: Secondary | ICD-10-CM | POA: Diagnosis not present

## 2015-11-21 DIAGNOSIS — K3189 Other diseases of stomach and duodenum: Secondary | ICD-10-CM | POA: Diagnosis not present

## 2015-11-21 DIAGNOSIS — Z79899 Other long term (current) drug therapy: Secondary | ICD-10-CM | POA: Diagnosis not present

## 2015-11-21 DIAGNOSIS — I8500 Esophageal varices without bleeding: Secondary | ICD-10-CM | POA: Diagnosis not present

## 2015-11-21 DIAGNOSIS — K921 Melena: Secondary | ICD-10-CM | POA: Diagnosis present

## 2015-11-21 DIAGNOSIS — E669 Obesity, unspecified: Secondary | ICD-10-CM | POA: Insufficient documentation

## 2015-11-21 HISTORY — PX: ESOPHAGOGASTRODUODENOSCOPY (EGD) WITH PROPOFOL: SHX5813

## 2015-11-21 HISTORY — DX: Unspecified osteoarthritis, unspecified site: M19.90

## 2015-11-21 LAB — HEPATITIS C RNA QUANTITATIVE: HCV QUANT: NOT DETECTED [IU]/mL (ref <15)

## 2015-11-21 SURGERY — ESOPHAGOGASTRODUODENOSCOPY (EGD) WITH PROPOFOL
Anesthesia: Monitor Anesthesia Care

## 2015-11-21 MED ORDER — FENTANYL CITRATE (PF) 100 MCG/2ML IJ SOLN
INTRAMUSCULAR | Status: AC
  Filled 2015-11-21: qty 2

## 2015-11-21 MED ORDER — LACTATED RINGERS IV SOLN
INTRAVENOUS | Status: DC | PRN
  Administered 2015-11-21: 13:00:00 via INTRAVENOUS

## 2015-11-21 MED ORDER — FENTANYL CITRATE (PF) 100 MCG/2ML IJ SOLN
50.0000 ug | Freq: Once | INTRAMUSCULAR | Status: AC
  Administered 2015-11-21: 50 ug via INTRAVENOUS

## 2015-11-21 MED ORDER — PROMETHAZINE HCL 25 MG/ML IJ SOLN: 6.2500 mg | INTRAMUSCULAR | Status: DC | PRN

## 2015-11-21 MED ORDER — HYDROCODONE-ACETAMINOPHEN 5-325 MG PO TABS: 1.0000 | ORAL_TABLET | Freq: Four times a day (QID) | ORAL | Status: DC | PRN

## 2015-11-21 MED ORDER — NADOLOL 20 MG PO TABS: 20.0000 mg | ORAL_TABLET | Freq: Every day | ORAL | Status: DC

## 2015-11-21 MED ORDER — PROPOFOL 10 MG/ML IV BOLUS
INTRAVENOUS | Status: DC | PRN
  Administered 2015-11-21: 50 mg via INTRAVENOUS

## 2015-11-21 MED ORDER — SODIUM CHLORIDE 0.9 % IV SOLN: INTRAVENOUS | Status: DC

## 2015-11-21 MED ORDER — PROPOFOL 500 MG/50ML IV EMUL
INTRAVENOUS | Status: DC | PRN
  Administered 2015-11-21: 25 ug/kg/min via INTRAVENOUS

## 2015-11-21 NOTE — Interval H&P Note (Signed)
History and Physical Interval Note:  11/21/2015 1:10 PM  Francisco [name]  has presented today for surgery, with the diagnosis of esophageal varices portal hypertensive gastropathy  The various methods of treatment have been discussed with the patient and family. After consideration of risks, benefits and other options for treatment, the patient has consented to  Procedure(s): ESOPHAGOGASTRODUODENOSCOPY (EGD) WITH PROPOFOL (N/A) as a surgical intervention .  The patient's history has been reviewed, patient examined, no change in status, stable for surgery.  I have reviewed the patient's chart and labs.  Questions were answered to the patient's satisfaction.     Silvano Rusk

## 2015-11-21 NOTE — Anesthesia Preprocedure Evaluation (Signed)
Anesthesia Evaluation  Patient identified by MRN, date of birth, ID band Patient awake    Reviewed: Allergy & Precautions, NPO status , Patient's Chart, lab work & pertinent test results  Airway Mallampati: II  TM Distance: >3 FB Neck ROM: Full    Dental no notable dental hx.    Pulmonary neg pulmonary ROS, Current Smoker,    Pulmonary exam normal breath sounds clear to auscultation       Cardiovascular negative cardio ROS Normal cardiovascular exam Rhythm:Regular Rate:Normal     Neuro/Psych negative neurological ROS  negative psych ROS   GI/Hepatic negative GI ROS, (+) Cirrhosis   Esophageal Varices    , Hepatitis -Portal hypertensive gastropathy 07/19/2014     Endo/Other  negative endocrine ROS  Renal/GU negative Renal ROS  negative genitourinary   Musculoskeletal negative musculoskeletal ROS (+)   Abdominal (+) + obese,   Peds negative pediatric ROS (+)  Hematology negative hematology ROS (+)   Anesthesia Other Findings   Reproductive/Obstetrics negative OB ROS                             Anesthesia Physical Anesthesia Plan  ASA: III  Anesthesia Plan: MAC   Post-op Pain Management:    Induction: Intravenous  Airway Management Planned: Nasal Cannula  Additional Equipment:   Intra-op Plan:   Post-operative Plan:   Informed Consent: I have reviewed the patients History and Physical, chart, labs and discussed the procedure including the risks, benefits and alternatives for the proposed anesthesia with the patient or authorized representative who has indicated his/her understanding and acceptance.   Dental advisory given  Plan Discussed with: CRNA and Surgeon  Anesthesia Plan Comments:         Anesthesia Quick Evaluation

## 2015-11-21 NOTE — Discharge Instructions (Addendum)
The varices (dilated veins) in the esophagus needed banding again.  I recommend you return in January to do this again - my office will arrange.  I am starting you on a medication called nadolol which reduces blood pressure in these veins and reduces the risk of bleeding. If you get extreme fatigue, dizziness or weakness from this stop it and let me know. We will have you come back to my office for a check of blood pressure and pulse next 1-2 weeks to follow-up on the effects of this medicine.  I appreciate the opportunity to care for you. Gatha Mayer, MD, FACG  YOU HAD AN ENDOSCOPIC PROCEDURE TODAY: Refer to the procedure report and other information in the discharge instructions given to you for any specific questions about what was found during the examination. If this information does not answer your questions, please call Dr. Celesta Aver office at [phone] to clarify.   YOU SHOULD EXPECT: Some feelings of bloating in the abdomen. Passage of more gas than usual. Walking can help get rid of the air that was put into your GI tract during the procedure and reduce the bloating. If you had a lower endoscopy (such as a colonoscopy or flexible sigmoidoscopy) you may notice spotting of blood in your stool or on the toilet paper. Some abdominal soreness may be present for a day or two, also.  DIET: Your first meal following the procedure should be a light meal and then it is ok to progress to your normal diet. A half-sandwich or bowl of soup is an example of a good first meal. Heavy or fried foods are harder to digest and may make you feel nauseous or bloated. Drink plenty of fluids but you should avoid alcoholic beverages for 24 hours.   ACTIVITY: Your care partner should take you home directly after the procedure. You should plan to take it easy, moving slowly for the rest of the day. You can resume normal activity the day after the procedure however YOU SHOULD NOT DRIVE, use power tools,  machinery or perform tasks that involve climbing or major physical exertion for 24 hours (because of the sedation medicines used during the test).   SYMPTOMS TO REPORT IMMEDIATELY: A gastroenterologist can be reached at any hour. Please call [phone]  for any of the following symptoms:   Following upper endoscopy (EGD, EUS, ERCP, esophageal dilation) Vomiting of blood or coffee ground material  New, significant abdominal pain   New, significant chest pain or pain under the shoulder blades - you will have some for a few days after the banding - if the pain medicine does not relieve let us know  Painful or persistently difficult swallowing  New shortness of breath  Black, tarry-looking or red, bloody stools  FOLLOW UP:  If any biopsies were taken you will be contacted by phone or by letter within the next 1-3 weeks. Call [phone]  if you have not heard about the biopsies in 3 weeks.  Please also call with any specific questions about appointments or follow up tests.   Esophageal Varices The esophagus is the passage that connects the throat to the stomach. Esophageal varices are blood vessels in the esophagus that have become enlarged. They develop when extra blood is forced to flow through them because the blood's normal pathway is blocked. Without treatment these blood vessels eventually break and bleed (hemorrhage). A hemorrhage is life-threatening. CAUSES This condition may be caused by:  Scarring of the liver (cirrhosis) due to  alcoholism. This is the most common cause.  Liver disease.  Severe heart failure.  A blood clot in the portal vein.  Sarcoidosis. This is an inflammatory disease that can affect the liver.  Schistosomiasis. This is a parasitic infection that can cause liver damage. SYMPTOMS Usually there are no symptoms unless the esophageal varices bleed. Symptoms of bleeding esophageal varices include:  Vomiting material that is bright red or that is black  and looks like coffee grounds.  Coughing up blood.  Black, tarry stools.  Dizziness or lightheadedness.  Low blood pressure.  Loss of consciousness. DIAGNOSIS This condition is diagnosed with tests, such as:  Endoscopy. During this test a thin, lighted tube is inserted through the mouth and into the esophagus.  Blood tests. These may be done to check liver function, blood counts, and the body's ability to form blood clots. TREATMENT This condition may be treated with:  Medicines that reduce pressure in the esophageal varices and reduce the risk of bleeding.  Procedures to reduce pressure in the esophageal varices and reduce the risk of bleeding or stop bleeding. These include:  Variceal ligation. In this procedure, a rubber band is placed around the esophageal varices to keep them from bleeding.  Injection therapy. This treatment involves an injection that causes the esophageal varices to shrink and close (sclerotherapy). Medicines that tighten blood vessels or alter blood flow may also be used.  Balloon tamponade. In this procedure, a tube is put into the esophagus and a balloon is passed through it and inflated.  Transjugular intrahepatic portosystemic shunt (TIPS) placement. In this procedure, a small tube is placed within the liver veins. This decreases blood flow and pressure to the esophageal varices.  A liver transplant. This may be done if other treatments do not work. HOME CARE INSTRUCTIONS  Take medicines only as directed by your health care provider.  Follow your health care provider's instructions about rest and physical activity. SEEK IMMEDIATE MEDICAL CARE IF:  You have any symptoms of this condition after treatment.  You are unable to eat or drink.  You have chest pain.   This information is not intended to replace advice given to you by your health care provider. Make sure you discuss any questions you have with your health care provider.   Document  Released: 02/27/2004 Document Revised: 04/23/2015 Document Reviewed: 12/03/2014 Elsevier Interactive Patient Education [employer].

## 2015-11-21 NOTE — H&P (View-Only) (Signed)
Quick Note:  Labs look good except slightly low Hgb and PLY ______

## 2015-11-21 NOTE — Anesthesia Procedure Notes (Signed)
Procedure Name: MAC Date/Time: 11/21/2015 1:48 PM Performed by: Neldon Newport Pre-anesthesia Checklist: Patient identified, Emergency Drugs available, Suction available, Timeout performed and Patient being monitored Oxygen Delivery Method: Nasal cannula Placement Confirmation: positive ETCO2

## 2015-11-21 NOTE — Op Note (Addendum)
[facility] [place], [id number]   ENDOSCOPY PROCEDURE REPORT  PATIENT: Francisco [name], Francisco [name]  MR#: [id number] BIRTHDATE: 1956/03/14 , 3  yrs. old GENDER: male ENDOSCOPIST: Gatha Mayer, MD, [facility] PROCEDURE DATE:  11/21/2015 PROCEDURE:  EGD w/ band ligation of varices ASA CLASS:     Class III INDICATIONS:  melena, varices, possible treatment. MEDICATIONS: Monitored anesthesia care and Per Anesthesia TOPICAL ANESTHETIC: none  DESCRIPTION OF PROCEDURE: After the risks benefits and alternatives of the procedure were thoroughly explained, informed consent was obtained.  The Pentax Gastroscope [id number] endoscope was introduced through the mouth and advanced to the second portion of the duodenum , Without limitations.  The instrument was slowly withdrawn as the mucosa was fully examined.    1) 3 columns of medium esophageal varices mid-distal esophagus.  No clear stigmata of bleeding seen.  Given hx melena I banded these x 6. 2) Proximal portal gastropathy - mosaic mucosa with red spots - no bleeding 3) Otherwise normal EGD.  Retroflexed views revealed as previously described.     The scope was then withdrawn from the patient and the procedure completed.  COMPLICATIONS: There were no immediate complications.  ENDOSCOPIC IMPRESSION: 1) 3 columns of medium esophageal varices mid-distal esophagus.  No clear stigmata of bleeding seen.  Given hx melena I banded these x 6. 2) Proximal portal gastropathy - mosaic mucosa with red spots - no bleeding 3) Otherwise normal EGD  RECOMMENDATIONS: 1.  Repeat EGD, possible banding in January - my hospital coverage week. 2.  My RN will arrange for BP and pulse check in 10-14 days - starting Nadolol 20 mg daily to reduce bleeding risks. 3.  Waiting on HCV RNA results and plan to also have him do MR vs [name] - I need to review w/ Dr.  Linus Salmons first  He had chest pain after the banding like he has had in past  - better after 50 ug Fentanyl x 2 - home with Vicodin   eSigned:  Gatha Mayer, MD, [facility] 11/21/2015 3:05 PM Revised: 11/21/2015 3:05 PM   CC: The Patient

## 2015-11-21 NOTE — Transfer of Care (Signed)
Immediate Anesthesia Transfer of Care Note  Patient: Francisco [name]  Procedure(s) Performed: Procedure(s): ESOPHAGOGASTRODUODENOSCOPY (EGD) WITH PROPOFOL (N/A)  Patient Location: Endoscopy Unit  Anesthesia Type:MAC  Level of Consciousness: awake, alert  and oriented  Airway & Oxygen Therapy: Patient Spontanous Breathing and Patient connected to nasal cannula oxygen  Post-op Assessment: Report given to RN, Post -op Vital signs reviewed and stable and Patient moving all extremities X 4  Post vital signs: Reviewed and stable  Last Vitals:  Filed Vitals:   11/21/15 1211  BP: 137/66  Pulse: 69  Temp: 36.6 C  Resp: 16    Complications: No apparent anesthesia complications

## 2015-11-21 NOTE — Progress Notes (Signed)
Having chest pain as he has had in past after esophageal variceal banding. Fentanyl has reduced but not eliminated the pain - he feels ok for dc and has an Rx for Vicodin5-325mg 

## 2015-11-21 NOTE — H&P (View-Only) (Signed)
Subjective:    Patient ID: Francisco [name], male    DOB: July 17, 1956, 59 y.o.   MRN: [id number] Cc: bleeding HPI Has hx esophageal varices and portal gastropathy with prior melena - has had banding - last EGD 1 yr ago - had done well but a few episodes of 2-3 d of melena, last about 1-2 weeks ago. Has not f/u ID for subsequent liver imaging and HCV testing though may be just about time to do so. Feels weak and tired. Remains off EtOH.  No Known Allergies Outpatient Prescriptions Prior to Visit  Medication Sig Dispense Refill  . Multiple Vitamin (MULTIVITAMIN) tablet Take 1 tablet by mouth daily.    . IRON, FERROUS GLUCONATE, PO Take by mouth.    . oxyCODONE (OXY IR/ROXICODONE) 5 MG immediate release tablet Take 1 tablet (5 mg total) by mouth every 6 (six) hours as needed for severe pain (use with stool softener, can cause constipation.). 30 tablet 0  . tizanidine (ZANAFLEX) 2 MG capsule Take 1 capsule (2 mg total) by mouth 3 (three) times daily as needed for muscle spasms. 30 capsule 0   No facility-administered medications prior to visit.   Past Medical History  Diagnosis Date  . Allergy   . Fatty liver 2011    Abd [name]  . Diverticulosis 2008  . Esophageal varices with bleeding ([place]) 07/19/2014  . Portal hypertensive gastropathy 07/19/2014  . Chronic hepatitis C ([place]) 07/19/2014  . Iron deficiency anemia secondary to blood loss (chronic) 07/19/2014  . Benign neoplasm of colon 07/19/2014    07/19/14 - 2 adenomas - repeat colonoscopy 2020   . Cirrhosis ([place])   . Arthritis    Past Surgical History  Procedure Laterality Date  . Orif zygomatic fracture Right   . Thyroidectomy  2012  . Colonoscopy  2008  . Radius synostosis proximal resection w/ allograft      at the elbow  . Esophagogastroduodenoscopy N/A 07/19/2014    Procedure: ESOPHAGOGASTRODUODENOSCOPY (EGD);  Surgeon: Gatha Mayer, MD;  Location: [facility] ENDOSCOPY;  Service: Endoscopy;  Laterality: N/A;  . Colonoscopy N/A  07/19/2014    Procedure: COLONOSCOPY;  Surgeon: Gatha Mayer, MD;  Location: [facility];  Service: Endoscopy;  Laterality: N/A;  . Esophagogastroduodenoscopy N/A 09/05/2014    Procedure: ESOPHAGOGASTRODUODENOSCOPY (EGD);  Surgeon: Gatha Mayer, MD;  Location: [facility] ENDOSCOPY;  Service: Endoscopy;  Laterality: N/A;  . Esophagogastroduodenoscopy N/A 10/17/2014    Procedure: ESOPHAGOGASTRODUODENOSCOPY (EGD);  Surgeon: Gatha Mayer, MD;  Location: [facility] ENDOSCOPY;  Service: Endoscopy;  Laterality: N/A;  . Gastric varices banding N/A 10/17/2014    Procedure: GASTRIC VARICES BANDING;  Surgeon: Gatha Mayer, MD;  Location: [facility] ENDOSCOPY;  Service: Endoscopy;  Laterality: N/A;  . Esophagogastroduodenoscopy N/A 12/19/2014    Procedure: ESOPHAGOGASTRODUODENOSCOPY (EGD);  Surgeon: Gatha Mayer, MD;  Location: [facility] ENDOSCOPY;  Service: Endoscopy;  Laterality: N/A;   Social History   Social History  . Marital Status: Divorced    Spouse Name: N/A  . Number of Children: 1  . Years of Education: N/A   Occupational History  . operations officer     [employer]   Social History Main Topics  . Smoking status: Current Every Day Smoker -- 0.50 packs/day for 40 years    Types: Cigarettes    Start date: 05/08/1974  . Smokeless tobacco: Never Used     Comment: cutting back  . Alcohol Use: 6.0 oz/week    10 Cans of beer per week  Comment: quit 08/27/14  . Drug Use: No  . Sexual Activity: Not Asked   Other Topics Concern  . 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 He has about 4 beers per day and 2 caffeinated beverages per day.   Family History  Problem Relation Age of Onset  . Crohn's disease Father   . Heart disease Maternal Grandmother   . Alcohol abuse Maternal Grandfather   . Alcohol abuse Maternal Uncle     several  . Cancer Paternal Grandfather     type unknown,  mets       Review of Systems As above    Objective:   Physical Exam  @BP  100/56 mmHg  Pulse 68  Ht 5\' 11"  (1.803 m)  Wt 225 lb 4 oz (102.173 kg)  BMI 31.43 kg/m2@  General:  Well-developed, well-nourished and in no acute distress Eyes:  anicteric. ENT:   Mouth and posterior pharynx free of lesions.  Neck:   supple w/o thyromegaly or mass.  Lungs: Clear to auscultation bilaterally. Heart:  S1S2, no rubs, murmurs, gallops. Abdomen:  soft, non-tender, no hepatosplenomegaly, + ssmall umbilical hernia hernia, or mass and BS+.  Rectal: Lymph:  no cervical or supraclavicular adenopathy. Extremities:   no edema, cyanosis or clubbing Skin   no rash. Neuro:  A&O x 3.  Psych:  appropriate mood and  Affect.   Data Reviewed: As per HPI    Assessment & Plan:   Hepatic cirrhosis Labs   Chronic hepatitis C without hepatic coma Recheck labs  Esophageal varices with bleeding ? Bleeding again  Portal hypertensive gastropathy ? Bleeding ? Beta blocker    EGD will be done - The risks and benefits as well as alternatives of endoscopic procedure(s) have been discussed and reviewed. All questions answered. The patient agrees to proceed.

## 2015-11-22 ENCOUNTER — Encounter (HOSPITAL_COMMUNITY): Payer: Self-pay | Admitting: Internal Medicine

## 2015-11-22 NOTE — Anesthesia Postprocedure Evaluation (Signed)
Anesthesia Post Note  Patient: Francisco [name]  Procedure(s) Performed: Procedure(s) (LRB): ESOPHAGOGASTRODUODENOSCOPY (EGD) WITH PROPOFOL (N/A)  Patient location during evaluation: PACU Anesthesia Type: MAC Level of consciousness: awake and alert Pain management: pain level controlled Vital Signs Assessment: post-procedure vital signs reviewed and stable Respiratory status: spontaneous breathing, nonlabored ventilation, respiratory function stable and patient connected to nasal cannula oxygen Cardiovascular status: blood pressure returned to baseline and stable Postop Assessment: no signs of nausea or vomiting Anesthetic complications: no    Last Vitals:  Filed Vitals:   11/21/15 1450 11/21/15 1500  BP:    Pulse: 74 62  Temp:    Resp: 18 25    Last Pain:  Filed Vitals:   11/21/15 1509  PainSc: 6                  Gustave Lindeman S

## 2015-11-25 ENCOUNTER — Other Ambulatory Visit: Payer: Self-pay

## 2015-11-25 DIAGNOSIS — B1920 Unspecified viral hepatitis C without hepatic coma: Secondary | ICD-10-CM

## 2015-11-25 DIAGNOSIS — K7460 Unspecified cirrhosis of liver: Secondary | ICD-10-CM

## 2015-11-25 NOTE — Progress Notes (Signed)
Quick Note:  Hepatitis C remains gone!  He needs a limited RUQ [name] re: cirrhosis, HCV - R/o tumors ______

## 2015-12-02 ENCOUNTER — Ambulatory Visit (HOSPITAL_COMMUNITY)
Admission: RE | Admit: 2015-12-02 | Discharge: 2015-12-02 | Disposition: A | Discharge status: Home / Self Care | Payer: BLUE CROSS/BLUE SHIELD | Source: Ambulatory Visit | Attending: Internal Medicine | Admitting: Internal Medicine

## 2015-12-02 DIAGNOSIS — B1920 Unspecified viral hepatitis C without hepatic coma: Secondary | ICD-10-CM | POA: Diagnosis present

## 2015-12-02 DIAGNOSIS — K7460 Unspecified cirrhosis of liver: Secondary | ICD-10-CM | POA: Insufficient documentation

## 2015-12-02 NOTE — Progress Notes (Signed)
Quick Note:  US shows stable cirrhosis - no tumors + gallstones - Asx - known Needs repeat in 6 months re: cirrhosis Also needs to come in this week for a BP and pulse check on 20 mg Nadolol  I also want him to have an REV me next available to review situation and discuss repeat banding of varices ______

## 2015-12-05 ENCOUNTER — Encounter: Payer: Self-pay | Admitting: Internal Medicine

## 2015-12-05 ENCOUNTER — Telehealth: Payer: Self-pay

## 2015-12-05 ENCOUNTER — Ambulatory Visit (INDEPENDENT_AMBULATORY_CARE_PROVIDER_SITE_OTHER): Payer: Self-pay | Admitting: Internal Medicine

## 2015-12-05 VITALS — BP 104/66 | HR 72

## 2015-12-05 DIAGNOSIS — K766 Portal hypertension: Secondary | ICD-10-CM

## 2015-12-05 MED ORDER — NADOLOL 40 MG PO TABS: 40.0000 mg | ORAL_TABLET | Freq: Every day | ORAL | Status: DC

## 2015-12-05 NOTE — Telephone Encounter (Signed)
Patient notified He is scheduled for follow up on 12/24/15 for vital check

## 2015-12-05 NOTE — Progress Notes (Signed)
Will increase nadolol to 40 mg qd

## 2015-12-05 NOTE — Telephone Encounter (Signed)
-----   Message from Gatha Mayer, MD sent at 12/05/2015  3:09 PM EST ----- Regarding: increase in nadolol Increased dose to 40 mg today - new Rx sent Please ask him to have vitals check first week Jan

## 2015-12-27 ENCOUNTER — Ambulatory Visit (INDEPENDENT_AMBULATORY_CARE_PROVIDER_SITE_OTHER): Payer: Self-pay | Admitting: Internal Medicine

## 2015-12-27 ENCOUNTER — Encounter: Payer: Self-pay | Admitting: Internal Medicine

## 2015-12-27 VITALS — BP 100/60 | HR 60

## 2015-12-27 DIAGNOSIS — K766 Portal hypertension: Secondary | ICD-10-CM

## 2016-01-30 ENCOUNTER — Encounter: Payer: Self-pay | Admitting: Internal Medicine

## 2016-01-30 ENCOUNTER — Other Ambulatory Visit (INDEPENDENT_AMBULATORY_CARE_PROVIDER_SITE_OTHER): Payer: BLUE CROSS/BLUE SHIELD

## 2016-01-30 ENCOUNTER — Ambulatory Visit (INDEPENDENT_AMBULATORY_CARE_PROVIDER_SITE_OTHER): Payer: BLUE CROSS/BLUE SHIELD | Admitting: Internal Medicine

## 2016-01-30 VITALS — BP 104/60 | HR 58 | Ht 71.0 in | Wt 225.0 lb

## 2016-01-30 DIAGNOSIS — K7460 Unspecified cirrhosis of liver: Secondary | ICD-10-CM

## 2016-01-30 DIAGNOSIS — K766 Portal hypertension: Secondary | ICD-10-CM

## 2016-01-30 DIAGNOSIS — I8511 Secondary esophageal varices with bleeding: Secondary | ICD-10-CM

## 2016-01-30 DIAGNOSIS — K3189 Other diseases of stomach and duodenum: Secondary | ICD-10-CM

## 2016-01-30 DIAGNOSIS — D500 Iron deficiency anemia secondary to blood loss (chronic): Secondary | ICD-10-CM | POA: Diagnosis not present

## 2016-01-30 LAB — CBC WITH DIFFERENTIAL/PLATELET
BASOS PCT: 0.7 % (ref 0.0–3.0)
Basophils Absolute: 0 10*3/uL (ref 0.0–0.1)
EOS PCT: 3.4 % (ref 0.0–5.0)
Eosinophils Absolute: 0.1 10*3/uL (ref 0.0–0.7)
HCT: 34.1 % — ABNORMAL LOW (ref 39.0–52.0)
Hemoglobin: 10.8 g/dL — ABNORMAL LOW (ref 13.0–17.0)
LYMPHS ABS: 1.5 10*3/uL (ref 0.7–4.0)
Lymphocytes Relative: 37.3 % (ref 12.0–46.0)
MCHC: 31.6 g/dL (ref 30.0–36.0)
MCV: 73.5 fl — ABNORMAL LOW (ref 78.0–100.0)
MONO ABS: 0.4 10*3/uL (ref 0.1–1.0)
Monocytes Relative: 10 % (ref 3.0–12.0)
NEUTROS PCT: 48.6 % (ref 43.0–77.0)
Neutro Abs: 2 10*3/uL (ref 1.4–7.7)
PLATELETS: 131 10*3/uL — AB (ref 150.0–400.0)
RBC: 4.64 Mil/uL (ref 4.22–5.81)
RDW: 19.7 % — AB (ref 11.5–15.5)
WBC: 4.1 10*3/uL (ref 4.0–10.5)

## 2016-01-30 NOTE — Assessment & Plan Note (Signed)
One episode of melena ?? Recheck CBC Hold off on banding due to severe pain - anticipate summer May recall

## 2016-01-30 NOTE — Patient Instructions (Addendum)
Go to the basement for labs today Your recall endoscopy is in May 2017    I appreciate the opportunity to care for you. Silvano Rusk, MD, [facility]

## 2016-01-30 NOTE — Progress Notes (Signed)
   Subjective:    Patient ID: Francisco [name], male    DOB: [birth year], 60 y.o.   MRN: [id number] Cc: f/u cirrhosis, varices anemia  HPI  [name] is here doing well. He might have had 1 more episode of melena since I last banded his varices in December. He had terrible chest pain after that banding but eventually that dissipated. I started him on nadolol and he is tolerated going from 20-40 mg and in fact says he feels very well at this time. The transient melena occurred when he took some Naprosyn related to a dental procedure, he hesitated to uses but he was having pain so he tried it.  Medications, allergies, past medical history, past surgical history, family history and social history are reviewed and updated in the EMR.   Review of Systems As above    Objective:   Physical Exam  @BP  104/60 mmHg  Pulse 58  Ht 5\' 11"  (1.803 m)  Wt 225 lb (102.059 kg)  BMI 31.39 kg/m2@  General:  NAD Eyes:   anicteric Lungs:  clear Heart:: S1S2 no rubs, murmurs or gallops Abdomen:  soft and nontender, BS+ Ext:   no edema, cyanosis or clubbing  Data Reviewed:  As above     Assessment & Plan:  Esophageal varices with bleeding (HCC) One episode of melena ?? Recheck CBC Hold off on banding due to severe pain - anticipate summer May recall  Iron deficiency anemia due to chronic blood loss cbc  Portal hypertensive gastropathy Nadolol on board and tolerating, we will continue  Hepatic cirrhosis stable

## 2016-01-30 NOTE — Progress Notes (Signed)
Quick Note:  Hgb remains low and MCV low so take ferrous sulfate 325 mg bid ______

## 2016-01-30 NOTE — Assessment & Plan Note (Addendum)
Nadolol on board and tolerating, we will continue

## 2016-01-30 NOTE — Assessment & Plan Note (Signed)
cbc

## 2016-01-30 NOTE — Assessment & Plan Note (Signed)
stable °

## 2016-06-02 ENCOUNTER — Telehealth: Payer: Self-pay

## 2016-06-02 DIAGNOSIS — K7469 Other cirrhosis of liver: Secondary | ICD-10-CM

## 2016-06-02 NOTE — Telephone Encounter (Signed)
Patient is scheduled for 06/08/16 8:30.  He is notified to be arrive at 815 and be NPO after midnight

## 2016-06-02 NOTE — Telephone Encounter (Signed)
-----   Message from [name], RN sent at 12/02/2015  3:58 PM EST ----- Needs [name] see results 12/02/15.  Francisco [name]

## 2016-06-08 ENCOUNTER — Ambulatory Visit (HOSPITAL_COMMUNITY)
Admission: RE | Admit: 2016-06-08 | Discharge: 2016-06-08 | Disposition: A | Discharge status: Home / Self Care | Payer: BLUE CROSS/BLUE SHIELD | Source: Ambulatory Visit | Attending: Internal Medicine | Admitting: Internal Medicine

## 2016-06-08 DIAGNOSIS — K7469 Other cirrhosis of liver: Secondary | ICD-10-CM | POA: Insufficient documentation

## 2016-06-08 DIAGNOSIS — K8020 Calculus of gallbladder without cholecystitis without obstruction: Secondary | ICD-10-CM | POA: Insufficient documentation

## 2016-06-08 DIAGNOSIS — K7460 Unspecified cirrhosis of liver: Secondary | ICD-10-CM | POA: Insufficient documentation

## 2016-06-08 NOTE — Progress Notes (Signed)
Quick Note:  Let him know it's ok. Repeat in 6 months ______

## 2016-08-31 ENCOUNTER — Encounter: Payer: Self-pay | Admitting: Internal Medicine

## 2016-08-31 ENCOUNTER — Ambulatory Visit (INDEPENDENT_AMBULATORY_CARE_PROVIDER_SITE_OTHER): Payer: Self-pay | Admitting: Internal Medicine

## 2016-08-31 ENCOUNTER — Other Ambulatory Visit (INDEPENDENT_AMBULATORY_CARE_PROVIDER_SITE_OTHER): Payer: Self-pay

## 2016-08-31 DIAGNOSIS — K7460 Unspecified cirrhosis of liver: Secondary | ICD-10-CM

## 2016-08-31 DIAGNOSIS — I8511 Secondary esophageal varices with bleeding: Secondary | ICD-10-CM

## 2016-08-31 DIAGNOSIS — R296 Repeated falls: Secondary | ICD-10-CM

## 2016-08-31 LAB — COMPREHENSIVE METABOLIC PANEL
ALK PHOS: 90 U/L (ref 39–117)
ALT: 33 U/L (ref 0–53)
AST: 28 U/L (ref 0–37)
Albumin: 4.4 g/dL (ref 3.5–5.2)
BUN: 11 mg/dL (ref 6–23)
CHLORIDE: 105 meq/L (ref 96–112)
CO2: 24 mEq/L (ref 19–32)
CREATININE: 0.64 mg/dL (ref 0.40–1.50)
Calcium: 10.3 mg/dL (ref 8.4–10.5)
GFR: 135.36 mL/min (ref >60.00)
Glucose, Bld: 65 mg/dL — ABNORMAL LOW (ref 70–99)
POTASSIUM: 4 meq/L (ref 3.5–5.1)
Sodium: 136 mEq/L (ref 135–145)
Total Bilirubin: 0.8 mg/dL (ref 0.2–1.2)
Total Protein: 8.1 g/dL (ref 6.0–8.3)

## 2016-08-31 LAB — CBC WITH DIFFERENTIAL/PLATELET
BASOS ABS: 0 10*3/uL (ref 0.0–0.1)
BASOS PCT: 0.4 % (ref 0.0–3.0)
EOS ABS: 0.1 10*3/uL (ref 0.0–0.7)
Eosinophils Relative: 2.5 % (ref 0.0–5.0)
HCT: 46.1 % (ref 39.0–52.0)
HEMOGLOBIN: 16.5 g/dL (ref 13.0–17.0)
Lymphocytes Relative: 22.9 % (ref 12.0–46.0)
Lymphs Abs: 1.3 10*3/uL (ref 0.7–4.0)
MCHC: 35.8 g/dL (ref 30.0–36.0)
MCV: 89.9 fl (ref 78.0–100.0)
MONO ABS: 0.5 10*3/uL (ref 0.1–1.0)
Monocytes Relative: 9.4 % (ref 3.0–12.0)
Neutro Abs: 3.7 10*3/uL (ref 1.4–7.7)
Neutrophils Relative %: 64.8 % (ref 43.0–77.0)
Platelets: 93 10*3/uL — ABNORMAL LOW (ref 150.0–400.0)
RBC: 5.13 Mil/uL (ref 4.22–5.81)
RDW: 13.9 % (ref 11.5–15.5)
WBC: 5.7 10*3/uL (ref 4.0–10.5)

## 2016-08-31 LAB — AMMONIA: AMMONIA: 54 umol/L — AB (ref 11–35)

## 2016-08-31 LAB — PROTIME-INR
INR: 1.2 ratio — AB (ref 0.8–1.0)
PROTHROMBIN TIME: 12.6 s (ref 9.6–13.1)

## 2016-08-31 NOTE — Assessment & Plan Note (Signed)
I'm not sure what the spells are, he relates a history of head trauma a decade ago. Subsequent to that he's had episodes were he gets acute weakness and is unable to walk and actually falls. He does not seem to lose consciousness. He is requesting neurologic evaluation and I think that's reasonable though may not turn up anything based upon what he is describing will defer to neurology. I'm checking ammonia today I do not suspect hepatic encephalopathy.

## 2016-08-31 NOTE — Assessment & Plan Note (Addendum)
Not bleeding now Repeat egd early 18 sxs from betablocker, did not feel well so he stopped. Consider restarting but he was lightheaded and dizzy. With his fall spells though I don't think that was related I will hold off on restarting this.

## 2016-08-31 NOTE — Assessment & Plan Note (Addendum)
Stable [name] ok in June without tumor would repeat again in December or January. He should see me in December or January. Labs today

## 2016-08-31 NOTE — Progress Notes (Signed)
Francisco [name] 60 y.o. [birth year] [id number]  Assessment & Plan:   Hepatic cirrhosis Stable [name] ok in June without tumor would repeat again in December or January. He should see me in December or January. Labs today  Esophageal varices with bleeding in the past [facility]) Not bleeding now Repeat egd early 18 sxs from betablocker, did not feel well so he stopped. Consider restarting but he was lightheaded and dizzy. With his fall spells though I don't think that was related I will hold off on restarting this.  Repeated falls I'm not sure what the spells are, he relates a history of head trauma a decade ago. Subsequent to that he's had episodes were he gets acute weakness and is unable to walk and actually falls. He does not seem to lose consciousness. He is requesting neurologic evaluation and I think that's reasonable though may not turn up anything based upon what he is describing will defer to neurology. I'm checking ammonia today I do not suspect hepatic encephalopathy.      Subjective:   Chief Complaint:  Follow-up cirrhosis HPI No signs of bleeding and the varices. In general he feels well overall. He does report a 10+ year history of intermittent falls. We've never discussed this before but something he is concerned about it is a seem to be getting worse. Apparently he had some sort of head trauma a long time ago I guess about 10 years ago. He subsequently noted that he would get weak in his legs and fall at intermittent times, sporadic without obvious cause. I don't think he injured himself with any of the falls but he has to prepare because he'll get weak and have to go to the ground. One time he relates being for  Fall, and he was able to put himself down on all fours before he fell, but then he became so weak that he collapsed on the ground and was unable to keep himself up. There is no vertigo or dizziness. No true seizure activity that it sounds like. He is requesting  neurologic evaluation.  He has been on nadolol at 20 and then 40 mg daily and when I last saw him several months ago he seemed to be tolerating that but he said he felt weak and dizzy like while on it so he quit that and no symptoms went away. That was a daily phenomenon as opposed the spells or episodes described above.  He is currently uninsured though he says he is saved money for medical expenses, he would like to put off endoscopic evaluation and follow-up. He is hoping to be able to get in affordable care act plan by manipulating his income from his for a one K etc. such that he qualifies.  Medications, allergies, past medical history, past surgical history, family history and social history are reviewed and updated in the EMR.    Review of Systems As per history of present illness  Objective:   Physical Exam @BP  110/70 (BP Location: Left Arm, Patient Position: Sitting, Cuff Size: Normal)   Pulse 68   Ht 5\' 11"  (1.803 m)   Wt 236 lb 9.6 oz (107.3 kg)   BMI 33.00 kg/m @  General:  NAD Eyes:   anicteric Lungs:  clear Heart::  S1S2 no rubs, murmurs or gallops Abdomen:  soft and nontender, BS+ Ext:   no edema, cyanosis or clubbing  Neuro:  A and o x 3 no asterixis Neg romberg FNF intact   I did review  previous labs and the ultrasound and June. Previous endoscopic evaluation December 2016. Barrett's sees were banded at that time and surveillance mode.

## 2016-08-31 NOTE — Patient Instructions (Signed)
   Your physician has requested that you go to the basement for the lab work before leaving today.    You will be contacted by [facility] regarding an appointment.  They can be reached at [phone].     Please come see Dr Carlean Purl in December/January.     I appreciate the opportunity to care for you. Silvano Rusk, MD, [facility]

## 2016-09-01 ENCOUNTER — Other Ambulatory Visit: Payer: Self-pay

## 2016-09-01 LAB — AFP TUMOR MARKER: AFP-Tumor Marker: 5.4 ng/mL (ref <6.1)

## 2016-09-01 MED ORDER — LACTULOSE 20 GM/30ML PO SOLN: ORAL | 5 refills | Status: DC

## 2016-09-01 NOTE — Progress Notes (Signed)
A couple of abnormalities to review:  1) Glucose was 65 - slightly low - did he eat before these labs drawn? Let me know - low glucose can be a sign of deteriorating liver function  2) Ammonia is elevated - can lead to subtle or even more profound confusion  And another possible sign of liver deterioration want him to start Tx -   3) Lactulose 1 tablespoon daily - # 1 month supply with 5 RF - advise this may make his stools loose - goal is soft stools not watery diarrhea so let us know if diarrhea with this  4) all other labs great

## 2016-09-03 ENCOUNTER — Other Ambulatory Visit: Payer: Self-pay

## 2016-09-03 DIAGNOSIS — K7469 Other cirrhosis of liver: Secondary | ICD-10-CM

## 2016-09-03 NOTE — Progress Notes (Signed)
Glucose and ammonia level in 1 month Dx:  Cirrhosis

## 2016-10-15 ENCOUNTER — Other Ambulatory Visit (INDEPENDENT_AMBULATORY_CARE_PROVIDER_SITE_OTHER): Payer: Self-pay

## 2016-10-15 DIAGNOSIS — K7469 Other cirrhosis of liver: Secondary | ICD-10-CM

## 2016-10-15 LAB — GLUCOSE, RANDOM: GLUCOSE: 94 mg/dL (ref 70–99)

## 2016-10-15 LAB — AMMONIA: AMMONIA: 55 umol/L — AB (ref 11–35)

## 2016-10-19 NOTE — Progress Notes (Signed)
Glucose is fine Ammonia still elevated - - is he taking the lactulose ? How much?

## 2016-10-20 ENCOUNTER — Other Ambulatory Visit: Payer: Self-pay

## 2016-10-20 DIAGNOSIS — K7469 Other cirrhosis of liver: Secondary | ICD-10-CM

## 2016-10-20 NOTE — Progress Notes (Signed)
Ask him to try 2 tablespoons daily Repeat the ammonia in 1 month F/u me in Jan

## 2016-11-23 ENCOUNTER — Telehealth: Payer: Self-pay

## 2016-11-23 DIAGNOSIS — Z8619 Personal history of other infectious and parasitic diseases: Secondary | ICD-10-CM

## 2016-11-23 DIAGNOSIS — K7460 Unspecified cirrhosis of liver: Secondary | ICD-10-CM

## 2016-11-23 NOTE — Telephone Encounter (Signed)
-----   Message from Elad Macphail E [place], [place] sent at 06/10/2016  2:01 PM EDT ----- Set up US abdomin limited RUQ, dx- cirrhosis, Hep C.

## 2016-11-23 NOTE — Telephone Encounter (Signed)
  I spoke with Elta [place] and he prefers I set up his U/S in January.   I'll call him back with date/time.

## 2016-11-23 NOTE — Telephone Encounter (Signed)
  U/S appointment made for 12/29/2016 at 8:45AM at [facility] radiology , NPO midnight , patient informed.

## 2016-12-04 ENCOUNTER — Other Ambulatory Visit (INDEPENDENT_AMBULATORY_CARE_PROVIDER_SITE_OTHER): Payer: Self-pay

## 2016-12-04 DIAGNOSIS — K7469 Other cirrhosis of liver: Secondary | ICD-10-CM

## 2016-12-04 LAB — AMMONIA: Ammonia: 31 umol/L (ref 11–35)

## 2016-12-15 NOTE — Progress Notes (Signed)
Ammonia level looks good Will discuss more at 1/9 f/u appt My Chart note

## 2016-12-29 ENCOUNTER — Ambulatory Visit (INDEPENDENT_AMBULATORY_CARE_PROVIDER_SITE_OTHER): Payer: Self-pay | Admitting: Internal Medicine

## 2016-12-29 ENCOUNTER — Ambulatory Visit (HOSPITAL_COMMUNITY)
Admission: RE | Admit: 2016-12-29 | Discharge: 2016-12-29 | Disposition: A | Discharge status: Home / Self Care | Payer: Self-pay | Source: Ambulatory Visit | Attending: Internal Medicine | Admitting: Internal Medicine

## 2016-12-29 ENCOUNTER — Encounter: Payer: Self-pay | Admitting: Internal Medicine

## 2016-12-29 VITALS — BP 110/60 | HR 80 | Ht 72.0 in | Wt 233.0 lb

## 2016-12-29 DIAGNOSIS — K7290 Hepatic failure, unspecified without coma: Secondary | ICD-10-CM

## 2016-12-29 DIAGNOSIS — K8020 Calculus of gallbladder without cholecystitis without obstruction: Secondary | ICD-10-CM | POA: Insufficient documentation

## 2016-12-29 DIAGNOSIS — K7682 Hepatic encephalopathy: Secondary | ICD-10-CM

## 2016-12-29 DIAGNOSIS — Z5989 Other problems related to housing and economic circumstances: Secondary | ICD-10-CM | POA: Insufficient documentation

## 2016-12-29 DIAGNOSIS — Z8619 Personal history of other infectious and parasitic diseases: Secondary | ICD-10-CM

## 2016-12-29 DIAGNOSIS — K7460 Unspecified cirrhosis of liver: Secondary | ICD-10-CM | POA: Insufficient documentation

## 2016-12-29 DIAGNOSIS — B182 Chronic viral hepatitis C: Secondary | ICD-10-CM

## 2016-12-29 DIAGNOSIS — I8511 Secondary esophageal varices with bleeding: Secondary | ICD-10-CM

## 2016-12-29 DIAGNOSIS — Z598 Other problems related to housing and economic circumstances: Secondary | ICD-10-CM

## 2016-12-29 NOTE — Progress Notes (Signed)
   Francisco [name] 61 y.o. 04/25/1956 [id number]  Assessment & Plan:   Encounter Diagnoses  Name Primary?  . Chronic hepatitis C with cirrhosis ([place]) Yes  . Encephalopathy, hepatic ([place])   . Secondary esophageal varices with bleeding ([facility])    Repeat US/OV in about 6 mos Continue current care     Subjective:   Chief Complaint: routine f/u of cirrhosis  HPI Doing well overall w/o problems except still w/ some "wobbly legs" But no falls [name] today stable cirrhosis Is going w/o health insurance as would be $21k/year Medications, allergies, past medical history, past surgical history, family history and social history are reviewed and updated in the EMR.  Review of Systems Rash on back  Objective:   Physical Exam @BP  110/60   Pulse 80   Ht 6' (1.829 m)   Wt 233 lb (105.7 kg)   BMI 31.60 kg/m @  General:  NAD Eyes:   anicteric Lungs:  clear Heart::  S1S2 no rubs, murmurs or gallops Abdomen:  soft and nontender, BS+, firm left lobe liver palpable Ext:   no edema, cyanosis or clubbing Skin:  Mild papular rash lumbar area, spider angiomata r post shldr    Data Reviewed:   2017 notes/labs, EGD, [name]

## 2016-12-29 NOTE — Patient Instructions (Signed)
   Glad your doing well.    We will call you in June to set up an ultrasound and office visit for you.     I appreciate the opportunity to care for you. Silvano Rusk, MD, [facility]

## 2016-12-30 NOTE — Progress Notes (Signed)
Reviewed in office Repeat 6 mos

## 2017-02-04 ENCOUNTER — Encounter: Payer: Self-pay | Admitting: Internal Medicine

## 2017-03-15 ENCOUNTER — Encounter (HOSPITAL_COMMUNITY): Payer: Self-pay

## 2017-03-15 ENCOUNTER — Inpatient Hospital Stay (HOSPITAL_COMMUNITY)
Admission: EM | Admit: 2017-03-15 | Discharge: 2017-03-17 | DRG: 369 | Disposition: A | Discharge status: Home / Self Care | Payer: Self-pay | Attending: Infectious Disease | Admitting: Infectious Disease

## 2017-03-15 DIAGNOSIS — Z809 Family history of malignant neoplasm, unspecified: Secondary | ICD-10-CM

## 2017-03-15 DIAGNOSIS — K766 Portal hypertension: Secondary | ICD-10-CM | POA: Diagnosis present

## 2017-03-15 DIAGNOSIS — Z8619 Personal history of other infectious and parasitic diseases: Secondary | ICD-10-CM | POA: Diagnosis present

## 2017-03-15 DIAGNOSIS — Z811 Family history of alcohol abuse and dependence: Secondary | ICD-10-CM

## 2017-03-15 DIAGNOSIS — K7460 Unspecified cirrhosis of liver: Secondary | ICD-10-CM | POA: Diagnosis present

## 2017-03-15 DIAGNOSIS — B182 Chronic viral hepatitis C: Secondary | ICD-10-CM | POA: Diagnosis present

## 2017-03-15 DIAGNOSIS — F1011 Alcohol abuse, in remission: Secondary | ICD-10-CM | POA: Diagnosis present

## 2017-03-15 DIAGNOSIS — K922 Gastrointestinal hemorrhage, unspecified: Secondary | ICD-10-CM

## 2017-03-15 DIAGNOSIS — I8501 Esophageal varices with bleeding: Principal | ICD-10-CM | POA: Diagnosis present

## 2017-03-15 DIAGNOSIS — F17210 Nicotine dependence, cigarettes, uncomplicated: Secondary | ICD-10-CM | POA: Diagnosis present

## 2017-03-15 DIAGNOSIS — R079 Chest pain, unspecified: Secondary | ICD-10-CM | POA: Diagnosis not present

## 2017-03-15 DIAGNOSIS — Z8249 Family history of ischemic heart disease and other diseases of the circulatory system: Secondary | ICD-10-CM

## 2017-03-15 DIAGNOSIS — F1021 Alcohol dependence, in remission: Secondary | ICD-10-CM

## 2017-03-15 DIAGNOSIS — D62 Acute posthemorrhagic anemia: Secondary | ICD-10-CM | POA: Diagnosis present

## 2017-03-15 DIAGNOSIS — Z8379 Family history of other diseases of the digestive system: Secondary | ICD-10-CM

## 2017-03-15 DIAGNOSIS — K3189 Other diseases of stomach and duodenum: Secondary | ICD-10-CM | POA: Diagnosis present

## 2017-03-15 DIAGNOSIS — K760 Fatty (change of) liver, not elsewhere classified: Secondary | ICD-10-CM | POA: Diagnosis present

## 2017-03-15 DIAGNOSIS — Z886 Allergy status to analgesic agent status: Secondary | ICD-10-CM

## 2017-03-15 DIAGNOSIS — D696 Thrombocytopenia, unspecified: Secondary | ICD-10-CM | POA: Diagnosis present

## 2017-03-15 HISTORY — DX: Other allergy status, other than to drugs and biological substances: Z91.09

## 2017-03-15 HISTORY — DX: Personal history of other medical treatment: Z92.89

## 2017-03-15 LAB — CBC
HEMATOCRIT: 29.5 % — AB (ref 39.0–52.0)
Hemoglobin: 10.1 g/dL — ABNORMAL LOW (ref 13.0–17.0)
MCH: 30.9 pg (ref 26.0–34.0)
MCHC: 34.2 g/dL (ref 30.0–36.0)
MCV: 90.2 fL (ref 78.0–100.0)
Platelets: 163 10*3/uL (ref 150–400)
RBC: 3.27 MIL/uL — AB (ref 4.22–5.81)
RDW: 15 % (ref 11.5–15.5)
WBC: 12.8 10*3/uL — ABNORMAL HIGH (ref 4.0–10.5)

## 2017-03-15 LAB — I-STAT CHEM 8, ED
BUN: 37 mg/dL — AB (ref 6–20)
CREATININE: 0.7 mg/dL (ref 0.61–1.24)
Calcium, Ion: 1.19 mmol/L (ref 1.15–1.40)
Chloride: 111 mmol/L (ref 101–111)
GLUCOSE: 109 mg/dL — AB (ref 65–99)
HCT: 23 % — ABNORMAL LOW (ref 39.0–52.0)
Hemoglobin: 7.8 g/dL — ABNORMAL LOW (ref 13.0–17.0)
Potassium: 4.4 mmol/L (ref 3.5–5.1)
Sodium: 140 mmol/L (ref 135–145)
TCO2: 20 mmol/L (ref 0–100)

## 2017-03-15 LAB — COMPREHENSIVE METABOLIC PANEL
ALT: 27 U/L (ref 17–63)
AST: 25 U/L (ref 15–41)
Albumin: 3.5 g/dL (ref 3.5–5.0)
Alkaline Phosphatase: 69 U/L (ref 38–126)
Anion gap: 8 (ref 5–15)
BILIRUBIN TOTAL: 1.1 mg/dL (ref 0.3–1.2)
BUN: 36 mg/dL — AB (ref 6–20)
CHLORIDE: 110 mmol/L (ref 101–111)
CO2: 21 mmol/L — ABNORMAL LOW (ref 22–32)
CREATININE: 0.73 mg/dL (ref 0.61–1.24)
Calcium: 9.3 mg/dL (ref 8.9–10.3)
GFR calc Af Amer: >60 mL/min (ref >60)
Glucose, Bld: 122 mg/dL — ABNORMAL HIGH (ref 65–99)
Potassium: 4.2 mmol/L (ref 3.5–5.1)
Sodium: 139 mmol/L (ref 135–145)
TOTAL PROTEIN: 6.5 g/dL (ref 6.5–8.1)

## 2017-03-15 LAB — I-STAT CG4 LACTIC ACID, ED
Lactic Acid, Venous: 1.58 mmol/L (ref 0.5–1.9)
Lactic Acid, Venous: 1.23 mmol/L (ref 0.5–1.9)

## 2017-03-15 LAB — DIFFERENTIAL
Basophils Absolute: 0 10*3/uL (ref 0.0–0.1)
Basophils Relative: 0 %
EOS ABS: 0.2 10*3/uL (ref 0.0–0.7)
EOS PCT: 1 %
LYMPHS ABS: 3 10*3/uL (ref 0.7–4.0)
Lymphocytes Relative: 24 %
MONO ABS: 0.8 10*3/uL (ref 0.1–1.0)
MONOS PCT: 6 %
Neutro Abs: 8.8 10*3/uL — ABNORMAL HIGH (ref 1.7–7.7)
Neutrophils Relative %: 69 %

## 2017-03-15 LAB — PROTIME-INR
INR: 1.18
PROTHROMBIN TIME: 15 s (ref 11.4–15.2)

## 2017-03-15 LAB — HEMOGLOBIN: Hemoglobin: 8.3 g/dL — ABNORMAL LOW (ref 13.0–17.0)

## 2017-03-15 LAB — HEMATOCRIT: HEMATOCRIT: 23.9 % — AB (ref 39.0–52.0)

## 2017-03-15 LAB — POC OCCULT BLOOD, ED: FECAL OCCULT BLD: POSITIVE — AB

## 2017-03-15 MED ORDER — SODIUM CHLORIDE 0.9 % IV SOLN
8.0000 mg/h | INTRAVENOUS | Status: DC
  Administered 2017-03-15 (×2): 8 mg/h via INTRAVENOUS
  Filled 2017-03-15 (×6): qty 80

## 2017-03-15 MED ORDER — SODIUM CHLORIDE 0.9 % IV BOLUS (SEPSIS)
1000.0000 mL | Freq: Once | INTRAVENOUS | Status: AC
  Administered 2017-03-15: 1000 mL via INTRAVENOUS

## 2017-03-15 MED ORDER — OCTREOTIDE LOAD VIA INFUSION
50.0000 ug | Freq: Once | INTRAVENOUS | Status: AC
  Administered 2017-03-15: 50 ug via INTRAVENOUS
  Filled 2017-03-15: qty 25

## 2017-03-15 MED ORDER — ONDANSETRON HCL 4 MG/2ML IJ SOLN: 4.0000 mg | Freq: Four times a day (QID) | INTRAMUSCULAR | Status: DC | PRN

## 2017-03-15 MED ORDER — SODIUM CHLORIDE 0.9 % IV SOLN
50.0000 ug/h | INTRAVENOUS | Status: DC
  Administered 2017-03-15 (×2): 50 ug/h via INTRAVENOUS
  Filled 2017-03-15 (×6): qty 1

## 2017-03-15 MED ORDER — ONDANSETRON HCL 4 MG PO TABS: 4.0000 mg | ORAL_TABLET | Freq: Four times a day (QID) | ORAL | Status: DC | PRN

## 2017-03-15 MED ORDER — SODIUM CHLORIDE 0.9 % IV SOLN
INTRAVENOUS | Status: DC
  Administered 2017-03-15: 1000 mL via INTRAVENOUS
  Administered 2017-03-16: 01:00:00 via INTRAVENOUS

## 2017-03-15 MED ORDER — DEXTROSE 5 % IV SOLN
1.0000 g | Freq: Once | INTRAVENOUS | Status: AC
  Administered 2017-03-15: 1 g via INTRAVENOUS
  Filled 2017-03-15: qty 10

## 2017-03-15 NOTE — Progress Notes (Signed)
Patient trasfered from ED to 5W10 via stretcher; alert and oriented x 4; no complaints of pain; IV in RAC and RH running fluids as ordered; skin intact. Orient patient to room and unit;  gave patient care guide; instructed how to use the call bell and  fall risk precautions. Will continue to monitor the patient.

## 2017-03-15 NOTE — H&P (Signed)
Date: 03/15/2017               Patient Name:  Francisco [name] MRN: [id number]  DOB: 1956/10/08 Age / Sex: 61 y.o., male   PCP: [name] Pcp Per Patient         Medical Service: Internal Medicine Teaching Service         Attending Physician: Dr. Truman Hayward, MD    First Contact: Dr. Holley Raring Pager: [phone]  Second Contact: Dr. Ignacia Marvel Pager: [phone]       After Hours (After 5p /  First Contact Pager: [phone]  Weekends / Holidays): Second Contact Pager: [phone]   Chief Complaint: hematemasis / melena  History of Present Illness: Francisco [name] is a 61 y.o. male with a h/o of Hep C cirrhosis s/p Harvoni w/ cure and esophageal varices who presents with 1 episode of hematemesis 4 days ago and persistent melena who complains of symptomatic anemia.  Patient reports that he was in his normal state of health until last Thursday when he had one episode of hematemesis after dinner. He reports that he had dark maroon emesis following his meal. Since that time he has had persistent dark melanotic stool which he describes as similar to previous episodes of melena from GI bleeds in the past. Michela Pitcher he has had one episode of melena each day since Thursday and 2 episodes yesterday. He has had mild abdominal discomfort which is diffuse. He reports that last night he had difficulty sleeping because of this discomfort and took 2 tablets of docusate. This morning he had a good bowel movement which he reported was dark and black. He denies any bright red blood per rectum. Patient also complains of some progressive dizziness with activity that culminated last night in difficulty with standing. Patient denies any significant shortness of breath, chest pain, fever, chills.  Patient [facility], and has routine liver ultrasounds screening for [place]. His gastroenterologist prescribed him lactulose which he was taking daily until recently when he has not been  taking this routinely. He reports that this was started several months ago when he began to have unsteadiness on his feet which would occur spontaneously and seem to be associated with left leg weakness. Denies any confusion. He reports that he has never had episodes of hepatic encephalopathy or other sequelae of his cirrhosis other than GI bleed. Patient denies having ever been on prophylaxis for bleeding of his varices and has never required a blood transfusion for GIB in the past. Patient discontinued all alcohol abuse after his diagnosis of cirrhosis several years ago. Patient denies any history of IV drug use. He did have a remote transfusion back in the 1980s, and would be agreeable to transfusions if needed presently.  In the emergency department, patient was noted to have significant drop in his hemoglobin, i-STAT of 7.8 and serum of 10. He was given 1 L normal saline bolus in light of his symptomatic anemia and active GI bleed. He was started on octreotide drip and pantoprazole drip. Patient was hemodynamically stable, complaining of mild diffuse abdominal pain but was otherwise comfortable in [name] acute distress.  Meds: Current Facility-Administered Medications  Medication Dose Route Frequency Provider Last Rate Last Dose  . octreotide (SANDOSTATIN) 500 mcg in sodium chloride 0.9 % 250 mL (2 mcg/mL) infusion  50 mcg/hr Intravenous Continuous Gareth Morgan, MD 25 mL/hr at 03/15/17 1127 50 mcg/hr at 03/15/17 1127  . pantoprazole (PROTONIX) 80  mg in sodium chloride 0.9 % 250 mL (0.32 mg/mL) infusion  8 mg/hr Intravenous Continuous Gareth Morgan, MD 25 mL/hr at 03/15/17 1126 8 mg/hr at 03/15/17 1126   Current Outpatient Prescriptions  Medication Sig Dispense Refill  . aspirin 325 MG tablet Take 325 mg by mouth daily as needed for mild pain or headache.    . Bisacodyl (DULCOLAX PO) Take 2 tablets by mouth daily as needed (constipation).    . Lactulose 20 GM/30ML SOLN Take 1 tbsp by mouth daily  450 mL 5  . Multiple Vitamin (MULTIVITAMIN) tablet Take 1 tablet by mouth daily.     Allergies: Allergies as of 03/15/2017 - Review Complete 03/15/2017  Allergen Reaction Noted  . Naproxen Other (See Comments) 01/30/2016   Past Medical History:  Diagnosis Date  . Allergy   . Arthritis   . Benign neoplasm of colon 07/19/2014   07/19/14 - 2 adenomas - repeat colonoscopy 2020   . Chronic hepatitis C ([place]) 07/19/2014  . Cirrhosis ([place])   . Diverticulosis 2008  . Esophageal varices with bleeding ([place]) 07/19/2014  . Fatty liver 2011   Abd [name]  . Iron deficiency anemia secondary to blood loss (chronic) 07/19/2014  . Portal hypertensive gastropathy 07/19/2014   Family History: Pt family history includes Alcohol abuse in his maternal grandfather and maternal uncle; Cancer in his paternal grandfather; Crohn's disease in his father; Heart disease in his maternal grandmother.  Social History: Pt  reports that he has been smoking Cigarettes.  He started smoking about 42 years ago. He has a 20.00 pack-year smoking history. He has never used smokeless tobacco. He reports that he drinks about 6.0 oz of alcohol per week . He reports that he does not use drugs.  Review of Systems: A complete ROS was negative except as per HPI. Review of Systems  Constitutional: Negative for chills, fever and weight loss.  Eyes: Negative for blurred vision.  Respiratory: Negative for cough and shortness of breath.   Cardiovascular: Negative for chest pain and leg swelling.  Gastrointestinal: Positive for abdominal pain, blood in stool and melena. Negative for constipation, diarrhea, nausea and vomiting.  Genitourinary: Negative for dysuria, frequency and urgency.  Musculoskeletal: Negative for myalgias.  Skin: Negative for rash.  Neurological: Positive for dizziness. Negative for tremors and headaches.  Endo/Heme/Allergies: Negative for polydipsia.  Psychiatric/Behavioral: The patient is not nervous/anxious.     Physical Exam: Vitals:   03/15/17 1115 03/15/17 1130 03/15/17 1145 03/15/17 1215  BP: 123/76 114/80 132/78 99/66  Pulse: (!) 104 (!) 102 91 88  Resp: (!) 27 (!) 24 (!) 22 (!) 21  Temp:      TempSrc:      SpO2: 100% 100% 100% 95%  Weight:      Height:       Physical Exam  Constitutional: He is oriented to person, place, and time. He appears well-developed and well-nourished. He is cooperative. [name] distress.  HENT:  Head: Normocephalic and atraumatic.  Mouth/Throat: Mucous membranes are normal.  Eyes: Pupils are equal, round, and reactive to light. Right eye exhibits nystagmus (mild right end-gaze). Left eye exhibits nystagmus (mild left end-gaze).  Cardiovascular: Normal rate, regular rhythm, S1 normal, S2 normal and intact distal pulses.  Exam reveals [name] gallop.   [name] murmur heard. Pulmonary/Chest: Effort normal and breath sounds normal. [name] respiratory distress. He has [name] wheezes. He has [name] rhonchi. He has [name] rales. He exhibits [name] tenderness.  Abdominal: Soft. Normal appearance and bowel sounds are normal. He  exhibits [name] distension, [name] fluid wave and [name] ascites. There is [name] hepatosplenomegaly. There is generalized tenderness (very mild).  Neurological: He is alert and oriented to person, place, and time. He has normal strength.  Skin: Skin is warm, dry and intact. He is not diaphoretic.  Psychiatric: He has a normal mood and affect. His speech is normal and behavior is normal.   Labs:  Recent Labs Lab 03/15/17 1129  LATICACIDVEN 1.58   CBC:  Recent Labs Lab 03/15/17 1046 03/15/17 1129  WBC 12.8*  --   NEUTROABS 8.8*  --   HGB 10.1* 7.8*  HCT 29.5* 23.0*  MCV 90.2  --   PLT 163  --    Basic Metabolic Panel:  Recent Labs Lab 03/15/17 1046 03/15/17 1129  NA 139 140  K 4.2 4.4  CL 110 111  CO2 21*  --   GLUCOSE 122* 109*  BUN 36* 37*  CREATININE 0.73 0.70  CALCIUM 9.3  --    Coagulation Studies:  Recent Labs  03/15/17 1046  LABPROT 15.0  INR 1.18    Liver Function Tests:  Recent Labs Lab 03/15/17 1046  AST 25  ALT 27  ALKPHOS 69  BILITOT 1.1  PROT 6.5  ALBUMIN 3.5   Imaging: EKG Interpretation  Date/Time:  Monday March 15 2017 11:05:00 EDT Ventricular Rate:  109 PR Interval:    QRS Duration: 90 QT Interval:  367 QTC Calculation: 495 R Axis:   -44 Text Interpretation:  Sinus tachycardia Ventricular premature complex Abnormal R-wave progression, late transition LVH by voltage [name] previous ECGs available Confirmed by [facility] Martinez MD, Junie Panning ([id number]) on 03/15/2017 11:51:15 AM  Assessment & Plan by Problem: Principal Problem:   GIB (gastrointestinal bleeding) Active Problems:   Hepatic cirrhosis ([place])  Francisco [name] is a 61 y.o. male with Cirrhosis and h/o variceal bleeding presents w/ hematemasis, melena, and symptomatic anemia 2/2 GIB.  1) GIB 2/2 likely variceal bleed: Patient reports one episode of hematemesis and persistent melena 4 days. Hemoglobin was 10 on serum check with positive FOBT. Patient has symptomatic anemia with dizziness. Is not currently on ppx w/ BB, would consider this on discharge. Plan for  - Admit to telemetry - GI consult, appreciate Rex - Pantoprazole gtt - Continue octreotide gtt - Nothing by mouth for EGD at 9:15am tomorrow - Trend CBC in p.m. and a.m. - Status post 1 L and his bolus, continue IVF for hypertension - Transfusion hemoglobin less than 7  2) Hepatic cirrhosis: Currently w/o significant sequelae other than esophageal varices. Hep C cured w/ Harvoni. Follows regularly w/ GI and has q 6 mos [name] for [facility] screening. Prescribed lactulose daily for concern of balance issues and elevated ammonia, but not taking recently. [name] episodes of hepatic encephalopathy in the past, may not require Lactulose regularly.  DVT PPx - SCD's while in bed  Code Status - Full  Consults Placed - GI  Dispo: Admit patient to Observation with expected length of stay less than 2  midnights.  Signed: Holley Raring, MD 03/15/2017, 1:35 PM  Pager: [phone]

## 2017-03-15 NOTE — Consult Note (Signed)
[facility] Consult: 12:18 PM 03/15/2017  LOS: 0 days    Referring Provider: Dr. Billy Fischer in ED  Primary Care Physician:  No PCP Per Patient Primary Gastroenterologist:  Dr. Carlean Purl.    Reason for Consultation:  Anemia, melena since hematemesis last week.   HPI: Francisco [name] is a 61 y.o. male.  PMH hepatitis C.  Dr Linus Salmons [facility] treatment with durable, sustained viral response (undetectable viral loads).  Hepatic encephalopathy.  No medical insurance.  EGD 07/19/2014 for iron deficiency anemia.  3 columns medium-sized distal and mid third esophageal varices. Band ligation times 4. Portal hypertensive gastropathy. Colonoscopy 07/19/2014 for iron deficiency anemia. Two sessile polyps, 5 and 7 mm sized, in the ascending and descending colon. Otherwise normal mucosa. Pathology:   10/17/2014 EGD.  2 bands placed to 3 columns of 1-2 plus varices in the mid to distal esophagus. Portal gastropathy. 08/2014 ultrasound with elastography: Metavir fibrosis score F4.  Splenomegaly, cholelithiasis.   12/19/2014 EGD.  3 columns of small, flat 1 plus at most esophageal varices in the mid to distal esophagus. Mild to moderate portal gastropathy. No band ligation performed. 11/2015 EGD with band ligation. Dr. Carlean Purl found 3 columns of medium sized mid to distal esophageal varices with no stigmata of bleeding. He had had melena so Dr. Carlean Purl placed 6 bands. Patient had proximal portal gastropathy's but otherwise normal EGD. Patient was started on nadolol 20 mg after this study. 12/29/16 ultrasound of the abdomen.  Showed cirrhosis, no focal hepatic abnormality, small gallstones.  Hgb in February 2017 was 10.8, MCV low at 73. By September 2017 Hgb up to 16.5 and MCV up to 89.  Alpha-fetoprotein 5.4 in 08/2016.   Undetectable hep C virus in 04/2015 and 10/2015.    On Thursday, 5 days ago, he had single episode of coffee-like emesis that looked purple/brown to him.  On that day and through this morning stools were soft, formed but black. Yesterday evening the stool became more of a dark brown color but again this morning they were black. In the last 36 hours he developed orthostatic dizziness.  He's been eating and drinking a little less than usual but no more recurrent vomiting. Because of the dizziness he presented to the emergency room this morning.  In the ED systolic blood pressure around 100 and heart rate in the 120s which have improved with IV fluids. Hemoglobin at 1045 was 10.1, it was rechecked at 11:30 and was down to 7.8 but this was a point-of-care lab, not a serum assay.  Repeat serum hemoglobin/hematocrit are to be collected this afternoon Platelets 163. PT 15, INR 1.18. BUN is elevated in the mid thirties with normal creatinine. Following IV fluids, the patient is feeling well. He has not had confusion. On Saturday night through Sunday he slept 13 hours which is a lot for him but he hasn't had any daytime sleepiness, confusion or mental slowing. He has not been using NSAIDs. Uses a coated 3 2 5  mg aspirin maybe once a month. Last alcohol consumption was in September 2015. Patient  has been compliant with daily lactulose. Patient took nadolol for about 3 months but stopped it because of side effects which he can't quite recall just that it didn't agree with him. Over 4 days, finishing up last Tuesday, he was replacing siding on his home. He describes doing a lot of bending over in order to accomplish the task. So he wonders if all that bending led to some sort of increased abdominal pressure which has now led to the bleeding.    Past Medical History:  Diagnosis Date  . Allergy   . Arthritis   . Benign neoplasm of colon 07/19/2014   07/19/14 - 2 adenomas - repeat colonoscopy 2020   . Chronic  hepatitis C ([place]) 07/19/2014  . Cirrhosis ([place])   . Diverticulosis 2008  . Esophageal varices with bleeding ([place]) 07/19/2014  . Fatty liver 2011   Abd [name]  . Iron deficiency anemia secondary to blood loss (chronic) 07/19/2014  . Portal hypertensive gastropathy 07/19/2014    Past Surgical History:  Procedure Laterality Date  . COLONOSCOPY  2008  . COLONOSCOPY N/A 07/19/2014   Procedure: COLONOSCOPY;  Surgeon: Gatha Mayer, MD;  Location: [place];  Service: Endoscopy;  Laterality: N/A;  . ESOPHAGOGASTRODUODENOSCOPY N/A 07/19/2014   Procedure: ESOPHAGOGASTRODUODENOSCOPY (EGD);  Surgeon: Gatha Mayer, MD;  Location[facility] ENDOSCOPY;  Service: Endoscopy;  Laterality: N/A;  . ESOPHAGOGASTRODUODENOSCOPY N/A 09/05/2014   Procedure: ESOPHAGOGASTRODUODENOSCOPY (EGD);  Surgeon: Gatha Mayer, MD;  Location: [facility] ENDOSCOPY;  Service: Endoscopy;  Laterality: N/A;  . ESOPHAGOGASTRODUODENOSCOPY N/A 10/17/2014   Procedure: ESOPHAGOGASTRODUODENOSCOPY (EGD);  Surgeon: Gatha Mayer, MD;  Location: [facility] ENDOSCOPY;  Service: Endoscopy;  Laterality: N/A;  . ESOPHAGOGASTRODUODENOSCOPY N/A 12/19/2014   Procedure: ESOPHAGOGASTRODUODENOSCOPY (EGD);  Surgeon: Gatha Mayer, MD;  Location: [facility] ENDOSCOPY;  Service: Endoscopy;  Laterality: N/A;  . ESOPHAGOGASTRODUODENOSCOPY (EGD) WITH PROPOFOL N/A 11/21/2015   Procedure: ESOPHAGOGASTRODUODENOSCOPY (EGD) WITH PROPOFOL;  Surgeon: Gatha Mayer, MD;  Location: [place];  Service: Endoscopy;  Laterality: N/A;  . GASTRIC VARICES BANDING N/A 10/17/2014   Procedure: GASTRIC VARICES BANDING;  Surgeon: Gatha Mayer, MD;  Location: [facility] ENDOSCOPY;  Service: Endoscopy;  Laterality: N/A;  . ORIF ZYGOMATIC FRACTURE Right   . RADIUS SYNOSTOSIS PROXIMAL RESECTION W/ ALLOGRAFT     at the elbow  . thyroidectomy  2012    Prior to Admission medications   Medication Sig Start Date End Date Taking? Authorizing Provider  aspirin 325 MG tablet Take 325 mg by mouth daily as needed  for mild pain or headache.   Yes Historical Provider, MD  Bisacodyl (DULCOLAX PO) Take 2 tablets by mouth daily as needed (constipation).   Yes Historical Provider, MD  Lactulose 20 GM/30ML SOLN Take 1 tbsp by mouth daily 09/01/16  Yes Gatha Mayer, MD  Multiple Vitamin (MULTIVITAMIN) tablet Take 1 tablet by mouth daily.   Yes Historical Provider, MD    Scheduled Meds:  Infusions: . cefTRIAXone (ROCEPHIN)  IV 1 g (03/15/17 1156)  . octreotide  (SANDOSTATIN)    IV infusion 50 mcg/hr (03/15/17 1127)  . pantoprozole (PROTONIX) infusion 8 mg/hr (03/15/17 1126)   PRN Meds:    Allergies as of 03/15/2017 - Review Complete 03/15/2017  Allergen Reaction Noted  . Naproxen Other (See Comments) 01/30/2016    Family History  Problem Relation Age of Onset  . Crohn's disease Father   . Heart disease Maternal Grandmother   . Alcohol abuse Maternal Grandfather   . Cancer Paternal Grandfather  type unknown, mets  . Alcohol abuse Maternal Uncle     several    Social History   Social History  . Marital status: Divorced    Spouse name: N/A  . Number of children: 1  . Years of education: N/A   Occupational History  . 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 Smoking status: Current Every Day Smoker    Packs/day: 0.50    Years: 40.00    Types: Cigarettes    Start date: 05/08/1974  . Smokeless tobacco: Never Used     Comment: cutting back  . Alcohol use 6.0 oz/week    10 Cans of beer per week     Comment: quit 08/27/14  . Drug use: No  . Sexual activity: Not on file   Other Topics Concern  . 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 He had about 4 beers per day (QUIT) and 2 caffeinated beverages per day.    REVIEW OF SYSTEMS: Constitutional: No profound weakness. ENT:  No nose bleeds Pulm:  4 few weeks he's had a mucoid cough. The mucus is clear to  yellowish. He still smoking 1 half pack cigarettes a day compared with 1 pack cigarettes daily in the past. CV:  No palpitations, no LE edema.  No chest pain GU:  No hematuria, no frequency GI:  Per HPI Heme:  Other than the recent GI bleeding, patient has not had any unusual or excessive bleeding or bruising. No difficulty clotting his blood after minor trauma. Transfusions: None Neuro:  No headaches, no peripheral tingling or numbness Derm:  No itching, no rash or sores.  Endocrine:  No sweats or chills.  No polyuria or dysuria ImmuVaccinated for hepatitis A and B 2015 3 2016, completed the series. Pneumococcal vaccine in 2015. Did not inquire as to recent influenza vaccination but he was vaccinated in 2015 Travel:  None beyond local counties in last few months.    PHYSICAL EXAM: Vital signs in last 24 hours: Vitals:   03/15/17 1130 03/15/17 1145  BP: 114/80 132/78  Pulse: (!) 102 91  Resp: (!) 24 (!) 22  Temp:     Wt Readings from Last 3 Encounters:  03/15/17 102.1 kg (225 lb)  12/29/16 105.7 kg (233 lb)  08/31/16 107.3 kg (236 lb 9.6 oz)    GenePleasant, fully alert, non-ill appearing WM Head:  No signs of head trauma. No facial asymmetry or swelling  Eyes:  No scleral icterus. No conjunctival pallor. Ears:    Not hard of hearing  Nose:  No discharge or congestion Mouth:  mucosa moist and clear. Tongue midline. Upper partial denture/bridge. Overall good dental health Neck:  no JVD, thyromegaly or masses. Lungs:  dry rales in the bases. No dyspnea or cough. Heart: RRR. No MRG. S1, S2 present. Abdomen:  Nonobese, soft, nontender. No HSM or masses. Active bowel sounds.   Rectal: did not repeat. Stool was black, FOBT positive per ED physician's rectal exam.   Musc/Skel:  no joint deformity, erythema or swelling Extremities:  No CCE.  Neurologic:  oriented times 3. No asterixis. Moves all 4 limbs, strength not tested. No tremor. Skin:  No telangiectasia, rashes or  sores Tattoos:  None seen Nodes:  no cervical adenopathy   Psych:  pleasant, calm, cooperative.  Intake/Output from previous day: No intake/output data  recorded. Intake/Output this shift: No intake/output data recorded.  LAB RESULTS:  Recent Labs  03/15/17 1046 03/15/17 1129  WBC 12.8*  --   HGB 10.1* 7.8*  HCT 29.5* 23.0*  PLT 163  --    BMET Lab Results  Component Value Date   NA 140 03/15/2017   NA 139 03/15/2017   NA 136 08/31/2016   K 4.4 03/15/2017   K 4.2 03/15/2017   K 4.0 08/31/2016   CL 111 03/15/2017   CL 110 03/15/2017   CL 105 08/31/2016   CO2 21 (L) 03/15/2017   CO2 24 08/31/2016   CO2 26 11/19/2015   GLUCOSE 109 (H) 03/15/2017   GLUCOSE 122 (H) 03/15/2017   GLUCOSE 94 10/15/2016   BUN 37 (H) 03/15/2017   BUN 36 (H) 03/15/2017   BUN 11 08/31/2016   CREATININE 0.70 03/15/2017   CREATININE 0.73 03/15/2017   CREATININE 0.64 08/31/2016   CALCIUM 9.3 03/15/2017   CALCIUM 10.3 08/31/2016   CALCIUM 10.0 11/19/2015   LFT  Recent Labs  03/15/17 1046  PROT 6.5  ALBUMIN 3.5  AST 25  ALT 27  ALKPHOS 69  BILITOT 1.1   PT/INR Lab Results  Component Value Date   INR 1.18 03/15/2017   INR 1.2 (H) 08/31/2016   INR 1.1 (H) 11/19/2015   Hepatitis Panel No results for input(s): HEPBSAG, HCVAB, HEPAIGM, HEPBIGM in the last 72 hours. C-Diff No components found for: CDIFF Lipase  No results found for: LIPASE  Drugs of Abuse  No results found for: LABOPIA, COCAINSCRNUR, LABBENZ, AMPHETMU, THCU, LABBARB   RADIOLOGY STUDIES: No results found.    IMPRESSION:   *  Coffee like emesis last week, melenic stool since.  Dropping Hgb though suspect to be 7.8 level is erroneous..  Started on PPI and octreotide drip.   Suspect portal gastropathy is source as the varices were quite small as of endoscopy 15 months ago.    *  Thrombocytopenia.  Noncritical. This is chronic and dates back to 2011.    PLAN:     *  EGD set for 9:15 for AM tomorrow.   Frost for clears today.  Await repeat serum Hgb.  Leave the Protonix and octreotide drips in place for now.   Azucena Freed  03/15/2017, 12:18 PM Pager: [phone]    Attending physician's note   I have taken a history, examined the patient and reviewed the chart. I agree with the Advanced Practitioner's note, impression and recommendations. Hematemesis and melena with history of cirrhosis, PHG and esophageal varices. Last EGD in 11/2015. IV octreotide, IV PPI, IV Rocephin, trend CBC. EGD tomorrow.   Lucio Edward, MD [facility] [phone] Mon-Fri 8a-5p [phone] after 5p, weekends, holidays

## 2017-03-15 NOTE — ED Provider Notes (Signed)
[place] DEPT Provider Note   CSN: [id number] Arrival date & time: 03/15/17  1031     History   Chief Complaint No chief complaint on file.   HPI Francisco [name] is a 61 y.o. male.  HPI   Presents with concern for melena.  Reports Thursday had episode of emesis which was grossly bloody, and since then has had black tarry stools 1-2 per day. Thought improved yesterday some, but today had very dark black, tarry stool, and began to feel lightheaded and fatigued so presented for further care.  Reports only dyspnea with activity. No syncope. No chest pain nor abdominal pain. Has not had etoh in 2 yrs. Denies NSAID use.  Past Medical History:  Diagnosis Date  . Allergy   . Arthritis   . Benign neoplasm of colon 07/19/2014   07/19/14 - 2 adenomas - repeat colonoscopy 2020   . Chronic hepatitis C ([place]) 07/19/2014  . Cirrhosis ([place])   . Diverticulosis 2008  . Esophageal varices with bleeding ([place]) 07/19/2014  . Fatty liver 2011   Abd [name]  . Iron deficiency anemia secondary to blood loss (chronic) 07/19/2014  . Portal hypertensive gastropathy 07/19/2014    Patient Active Problem List   Diagnosis Date Noted  . Upper GI bleed 03/15/2017  . Uninsured 12/29/2016  . Repeated falls 08/31/2016  . Hepatic cirrhosis ([place]) 01/31/2015  . GIB (gastrointestinal bleeding) 07/19/2014  . Portal hypertensive gastropathy 07/19/2014  . Chronic hepatitis C without hepatic coma ([place]) 07/19/2014  . Personal history of colonic polyps 07/19/2014  . Iron deficiency anemia due to chronic blood loss 07/19/2014    Past Surgical History:  Procedure Laterality Date  . COLONOSCOPY  2008  . COLONOSCOPY N/A 07/19/2014   Procedure: COLONOSCOPY;  Surgeon: Gatha Mayer, MD;  Location: [place];  Service: Endoscopy;  Laterality: N/A;  . ESOPHAGOGASTRODUODENOSCOPY N/A 07/19/2014   Procedure: ESOPHAGOGASTRODUODENOSCOPY (EGD);  Surgeon: Gatha Mayer, MD;  Location: [facility] ENDOSCOPY;  Service: Endoscopy;   Laterality: N/A;  . ESOPHAGOGASTRODUODENOSCOPY N/A 09/05/2014   Procedure: ESOPHAGOGASTRODUODENOSCOPY (EGD);  Surgeon: Gatha Mayer, MD;  Location: [facility] END[facility]COPY;  Service: Endoscopy;  Laterality: N/A;  . ESOPHAGOGASTRODUODENOSCOPY N/A 10/17/2014   Procedure: ESOPHAGOGASTRODUODENOSCOPY (EGD);  Surgeon: Gatha Mayer, MD;  Location: [facility] ENDOSCOPY;  Service: Endoscopy;  Laterality: N/A;  . ESOPHAGOGASTRODUODENOSCOPY N/A 12/19/2014   Procedure: ESOPHAGOGASTRODUODENOSCOPY (EGD);  Surgeon: Gatha Mayer, MD;  Location: [facility] ENDOSCOPY;  Service: Endoscopy;  Laterality: N/A;  . ESOPHAGOGASTRODUODENOSCOPY (EGD) WITH PROPOFOL N/A 11/21/2015   Procedure: ESOPHAGOGASTRODUODENOSCOPY (EGD) WITH PROPOFOL;  Surgeon: Gatha Mayer, MD;  Location: [place];  Service: Endoscopy;  Laterality: N/A;  . GASTRIC VARICES BANDING N/A 10/17/2014   Procedure: GASTRIC VARICES BANDING;  Surgeon: Gatha Mayer, MD;  Location: [facility] ENDOSCOPY;  Service: Endoscopy;  Laterality: N/A;  . ORIF ZYGOMATIC FRACTURE Right   . RADIUS SYNOSTOSIS PROXIMAL RESECTION W/ ALLOGRAFT     at the elbow  . thyroidectomy  2012       Home Medications    Prior to Admission medications   Medication Sig Start Date End Date Taking? Authorizing Provider  aspirin 325 MG tablet Take 325 mg by mouth daily as needed for mild pain or headache.   Yes Historical Provider, MD  Bisacodyl (DULCOLAX PO) Take 2 tablets by mouth daily as needed (constipation).   Yes Historical Provider, MD  Lactulose 20 GM/30ML SOLN Take 1 tbsp by mouth daily 09/01/16  Yes Gatha Mayer, MD  Multiple Vitamin (MULTIVITAMIN) tablet  Take 1 tablet by mouth daily.   Yes Historical Provider, MD    Family History Family History  Problem Relation Age of Onset  . Crohn's disease Father   . Heart disease Maternal Grandmother   . Alcohol abuse Maternal Grandfather   . Cancer Paternal Grandfather     type unknown, mets  . Alcohol abuse Maternal Uncle     several    Social  History Social History  Substance Use Topics  . Smoking status: Current Every Day Smoker    Packs/day: 0.50    Years: 40.00    Types: Cigarettes    Start date: 05/08/1974  . Smokeless tobacco: Never Used     Comment: cutting back  . Alcohol use 6.0 oz/week    10 Cans of beer per week     Comment: quit 08/27/14     Allergies   Naproxen   Review of Systems Review of Systems  Constitutional: Positive for fatigue. Negative for fever.  HENT: Negative for sore throat.   Eyes: Negative for visual disturbance.  Respiratory: Negative for shortness of breath.   Cardiovascular: Negative for chest pain.  Gastrointestinal: Positive for blood in stool, diarrhea, nausea and vomiting (thursday). Negative for abdominal pain.  Genitourinary: Negative for difficulty urinating.  Musculoskeletal: Negative for back pain and neck stiffness.  Skin: Negative for rash.  Neurological: Positive for light-headedness. Negative for syncope and headaches.     Physical Exam Updated Vital Signs BP 126/87   Pulse 99   Temp 98.5 F (36.9 C) (Oral)   Resp 18   Ht 6' (1.829 m)   Wt 228 lb 8 oz (103.6 kg)   SpO2 100%   BMI 30.99 kg/m   Physical Exam  Constitutional: He is oriented to person, place, and time. He appears well-developed and well-nourished. No distress.  HENT:  Head: Normocephalic and atraumatic.  Eyes: Conjunctivae and EOM are normal.  Neck: Normal range of motion.  Cardiovascular: Regular rhythm, normal heart sounds and intact distal pulses.  Tachycardia present.  Exam reveals no gallop and no friction rub.   No murmur heard. Pulmonary/Chest: Effort normal and breath sounds normal. No respiratory distress. He has no wheezes. He has no rales.  Abdominal: Soft. He exhibits no distension. There is no tenderness. There is no guarding.  Genitourinary: Rectal exam shows guaiac positive stool.  Genitourinary Comments: melena  Musculoskeletal: He exhibits no edema.  Neurological: He is  alert and oriented to person, place, and time.  Skin: Skin is warm and dry. He is not diaphoretic.  Nursing note and vitals reviewed.    ED Treatments / Results  Labs (all labs ordered are listed, but only abnormal results are displayed) Labs Reviewed  COMPREHENSIVE METABOLIC PANEL - Abnormal; Notable for the following:       Result Value   CO2 21 (*)    Glucose, Bld 122 (*)    BUN 36 (*)    All other components within normal limits  CBC - Abnormal; Notable for the following:    WBC 12.8 (*)    RBC 3.27 (*)    Hemoglobin 10.1 (*)    HCT 29.5 (*)    All other components within normal limits  DIFFERENTIAL - Abnormal; Notable for the following:    Neutro Abs 8.8 (*)    All other components within normal limits  HEMOGLOBIN - Abnormal; Notable for the following:    Hemoglobin 8.3 (*)    All other components within normal limits  HEMATOCRIT -  Abnormal; Notable for the following:    HCT 23.9 (*)    All other components within normal limits  POC OCCULT BLOOD, ED - Abnormal; Notable for the following:    Fecal Occult Bld POSITIVE (*)    All other components within normal limits  I-STAT CHEM 8, ED - Abnormal; Notable for the following:    BUN 37 (*)    Glucose, Bld 109 (*)    Hemoglobin 7.8 (*)    HCT 23.0 (*)    All other components within normal limits  PROTIME-INR  HIV ANTIBODY (ROUTINE TESTING)  BASIC METABOLIC PANEL  CBC  HEMOGLOBIN AND HEMATOCRIT, BLOOD  I-STAT CG4 LACTIC ACID, ED  I-STAT CG4 LACTIC ACID, ED  TYPE AND SCREEN    EKG  EKG Interpretation  Date/Time:  Monday March 15 2017 11:05:00 EDT Ventricular Rate:  109 PR Interval:    QRS Duration: 90 QT Interval:  367 QTC Calculation: 495 R Axis:   -44 Text Interpretation:  Sinus tachycardia Ventricular premature complex Abnormal R-wave progression, late transition LVH by voltage No previous ECGs available Confirmed by [facility] MD, Junie Panning ([id number]) on 03/15/2017 11:51:15 AM       Radiology No results  found.  Procedures Procedures (including critical care time)  Medications Ordered in ED Medications  octreotide (SANDOSTATIN) 2 mcg/mL load via infusion 50 mcg (50 mcg Intravenous Bolus from Bag 03/15/17 1126)    And  octreotide (SANDOSTATIN) 500 mcg in sodium chloride 0.9 % 250 mL (2 mcg/mL) infusion (50 mcg/hr Intravenous New Bag/Given 03/15/17 1127)  pantoprazole (PROTONIX) 80 mg in sodium chloride 0.9 % 250 mL (0.32 mg/mL) infusion (8 mg/hr Intravenous New Bag/Given 03/15/17 1126)  0.9 %  sodium chloride infusion (1,000 mLs Intravenous New Bag/Given 03/15/17 1605)  ondansetron (ZOFRAN) tablet 4 mg (not administered)    Or  ondansetron (ZOFRAN) injection 4 mg (not administered)  sodium chloride 0.9 % bolus 1,000 mL (0 mLs Intravenous Stopped 03/15/17 1223)  cefTRIAXone (ROCEPHIN) 1 g in dextrose 5 % 50 mL IVPB (0 g Intravenous Stopped 03/15/17 1226)     Initial Impression / Assessment and Plan / ED Course  I have reviewed the triage vital signs and the nursing notes.  Pertinent labs & imaging results that were available during my care of the patient were reviewed by me and considered in my medical decision making (see chart for details).    61yo male with a history of of cirrhosis, variceal bleed followed by Dr. Carlean Purl with multiple bands placed, presents with concern for hematemesis on Thursday and melena over last few days as well as lightheadedness. Patient with tachycardia and systolic BP 503T (typically 120s per pt) on arrival to ED.  Hgb 10 on CBC. I-stat chem 8 reads 7.8, but unclear accuracy. Vital signs improved with IV fluids.  No further episodes of bleeding in ED.  Consulted [facility] who will come evaluate the patient.  Pt placed on protonix and octreotide gtt for possible variceal bleed and given rocephin for GI bleed in cirrhosis.  Admitted for further care.   Final Clinical Impressions(s) / ED Diagnoses   Final diagnoses:  Upper GI bleed    New Prescriptions Current  Discharge Medication List       Gareth Morgan, MD 03/15/17 2203

## 2017-03-15 NOTE — ED Triage Notes (Addendum)
Patient reports that he has cirrhosis and on Thursday began vomiting bright red blood. That has resolved but reports dark tarry stools. Patient with dyspnea, weakness and pale on arrival. Hx of esophageal varices. HR 130's on arrival

## 2017-03-16 ENCOUNTER — Observation Stay (HOSPITAL_COMMUNITY): Payer: Self-pay | Admitting: Certified Registered Nurse Anesthetist

## 2017-03-16 ENCOUNTER — Encounter (HOSPITAL_COMMUNITY)
Admission: EM | Disposition: A | Discharge status: Home / Self Care | Payer: Self-pay | Source: Home / Self Care | Attending: Infectious Disease

## 2017-03-16 ENCOUNTER — Encounter (HOSPITAL_COMMUNITY): Payer: Self-pay | Admitting: Certified Registered Nurse Anesthetist

## 2017-03-16 DIAGNOSIS — I8501 Esophageal varices with bleeding: Principal | ICD-10-CM

## 2017-03-16 DIAGNOSIS — D696 Thrombocytopenia, unspecified: Secondary | ICD-10-CM

## 2017-03-16 DIAGNOSIS — Z8619 Personal history of other infectious and parasitic diseases: Secondary | ICD-10-CM | POA: Diagnosis present

## 2017-03-16 DIAGNOSIS — F1021 Alcohol dependence, in remission: Secondary | ICD-10-CM | POA: Insufficient documentation

## 2017-03-16 DIAGNOSIS — K766 Portal hypertension: Secondary | ICD-10-CM | POA: Diagnosis present

## 2017-03-16 DIAGNOSIS — Z9889 Other specified postprocedural states: Secondary | ICD-10-CM

## 2017-03-16 HISTORY — PX: ESOPHAGOGASTRODUODENOSCOPY: SHX5428

## 2017-03-16 LAB — BASIC METABOLIC PANEL
Anion gap: 5 (ref 5–15)
BUN: 19 mg/dL (ref 6–20)
CALCIUM: 8.7 mg/dL — AB (ref 8.9–10.3)
CHLORIDE: 112 mmol/L — AB (ref 101–111)
CO2: 22 mmol/L (ref 22–32)
CREATININE: 0.79 mg/dL (ref 0.61–1.24)
Glucose, Bld: 125 mg/dL — ABNORMAL HIGH (ref 65–99)
Potassium: 4.2 mmol/L (ref 3.5–5.1)
SODIUM: 139 mmol/L (ref 135–145)

## 2017-03-16 LAB — CBC
HCT: 20.8 % — ABNORMAL LOW (ref 39.0–52.0)
HEMATOCRIT: 23.8 % — AB (ref 39.0–52.0)
Hemoglobin: 7.1 g/dL — ABNORMAL LOW (ref 13.0–17.0)
Hemoglobin: 8.2 g/dL — ABNORMAL LOW (ref 13.0–17.0)
MCH: 31.1 pg (ref 26.0–34.0)
MCH: 31.3 pg (ref 26.0–34.0)
MCHC: 34.1 g/dL (ref 30.0–36.0)
MCHC: 34.5 g/dL (ref 30.0–36.0)
MCV: 91.2 fL (ref 78.0–100.0)
MCV: 90.8 fL (ref 78.0–100.0)
PLATELETS: 74 10*3/uL — AB (ref 150–400)
Platelets: 68 10*3/uL — ABNORMAL LOW (ref 150–400)
RBC: 2.28 MIL/uL — AB (ref 4.22–5.81)
RBC: 2.62 MIL/uL — ABNORMAL LOW (ref 4.22–5.81)
RDW: 15.8 % — AB (ref 11.5–15.5)
RDW: 15.9 % — AB (ref 11.5–15.5)
WBC: 3.8 10*3/uL — AB (ref 4.0–10.5)
WBC: 4 10*3/uL (ref 4.0–10.5)

## 2017-03-16 LAB — PREPARE RBC (CROSSMATCH)

## 2017-03-16 LAB — HIV ANTIBODY (ROUTINE TESTING W REFLEX): HIV SCREEN 4TH GENERATION: NONREACTIVE

## 2017-03-16 SURGERY — EGD (ESOPHAGOGASTRODUODENOSCOPY)
Anesthesia: Monitor Anesthesia Care

## 2017-03-16 MED ORDER — SUCRALFATE 1 GM/10ML PO SUSP
1.0000 g | Freq: Three times a day (TID) | ORAL | Status: DC
  Administered 2017-03-16 – 2017-03-17 (×4): 1 g via ORAL
  Filled 2017-03-16 (×4): qty 10

## 2017-03-16 MED ORDER — TRAMADOL HCL 50 MG PO TABS
50.0000 mg | ORAL_TABLET | Freq: Four times a day (QID) | ORAL | Status: DC | PRN
  Administered 2017-03-16: 50 mg via ORAL
  Filled 2017-03-16: qty 1

## 2017-03-16 MED ORDER — PHENYLEPHRINE 40 MCG/ML (10ML) SYRINGE FOR IV PUSH (FOR BLOOD PRESSURE SUPPORT)
PREFILLED_SYRINGE | INTRAVENOUS | Status: DC | PRN
  Administered 2017-03-16: 120 ug via INTRAVENOUS
  Administered 2017-03-16 (×2): 80 ug via INTRAVENOUS

## 2017-03-16 MED ORDER — PROPOFOL 500 MG/50ML IV EMUL
INTRAVENOUS | Status: DC | PRN
  Administered 2017-03-16: 100 ug/kg/min via INTRAVENOUS

## 2017-03-16 MED ORDER — LIDOCAINE 2% (20 MG/ML) 5 ML SYRINGE
INTRAMUSCULAR | Status: DC | PRN
  Administered 2017-03-16: 40 mg via INTRAVENOUS

## 2017-03-16 MED ORDER — OXYCODONE HCL 5 MG PO TABS
5.0000 mg | ORAL_TABLET | Freq: Four times a day (QID) | ORAL | Status: DC | PRN
  Administered 2017-03-16 (×2): 5 mg via ORAL
  Filled 2017-03-16 (×2): qty 1

## 2017-03-16 MED ORDER — LACTATED RINGERS IV SOLN
INTRAVENOUS | Status: DC | PRN
  Administered 2017-03-16: 09:00:00 via INTRAVENOUS

## 2017-03-16 MED ORDER — BUTAMBEN-TETRACAINE-BENZOCAINE 2-2-14 % EX AERO
INHALATION_SPRAY | CUTANEOUS | Status: DC | PRN
  Administered 2017-03-16: 2 via TOPICAL

## 2017-03-16 MED ORDER — SODIUM CHLORIDE 0.9 % IV SOLN: Freq: Once | INTRAVENOUS | Status: DC

## 2017-03-16 MED ORDER — PROPOFOL 10 MG/ML IV BOLUS
INTRAVENOUS | Status: DC | PRN
  Administered 2017-03-16 (×2): 10 mg via INTRAVENOUS

## 2017-03-16 MED ORDER — PANTOPRAZOLE SODIUM 40 MG IV SOLR
40.0000 mg | Freq: Two times a day (BID) | INTRAVENOUS | Status: DC
  Administered 2017-03-16 – 2017-03-17 (×3): 40 mg via INTRAVENOUS
  Filled 2017-03-16 (×3): qty 40

## 2017-03-16 NOTE — Progress Notes (Signed)
MD informed of patient's BP being 87/48. Mo mew orders at this time. Will continue to monitor.

## 2017-03-16 NOTE — Progress Notes (Signed)
Patient back from Endo. Alert and oriented, complaining of chest pain ( patient said he has chest pain after each EGD). MD made aware. Will continue to monitor.

## 2017-03-16 NOTE — Anesthesia Procedure Notes (Signed)
Procedure Name: MAC Date/Time: 03/16/2017 9:20 AM Performed by: Garrison [place] T Pre-anesthesia Checklist: Patient identified, Emergency Drugs available, Suction available and Patient being monitored Patient Re-evaluated:Patient Re-evaluated prior to inductionOxygen Delivery Method: Nasal cannula Preoxygenation: Pre-oxygenation with 100% oxygen Intubation Type: IV induction Placement Confirmation: positive ETCO2 and breath sounds checked- equal and bilateral Dental Injury: Teeth and Oropharynx as per pre-operative assessment

## 2017-03-16 NOTE — Progress Notes (Signed)
Lab called the floor and requested another sample of blood for CBC before the one who was obtained was clotted.

## 2017-03-16 NOTE — Progress Notes (Signed)
I call lab. And phlebotomy multiple times for Hgb,1:00PM was never done. In the morning it was not drawn till 6:45 AM, they promised to do it soon.

## 2017-03-16 NOTE — Op Note (Signed)
[facility] Patient Name: Francisco [name] Procedure Date : 03/16/2017 MRN: [id number] Attending MD: Ladene Artist , MD Date of Birth: [birth year] CSN: [id number] Age: 61 Admit Type: Inpatient Procedure:                Upper GI endoscopy Indications:              Hematemesis, Melena Providers:                Pricilla Riffle. Fuller Plan, MD, Carolynn Comment, RN, Alfonso Patten, Technician, Garrison [place], CRNA Referring MD:             [facility] Medicines:                Monitored Anesthesia Care Complications:            No immediate complications. Estimated blood loss:                            None. Estimated Blood Loss:     Estimated blood loss was minimal. Procedure:                Pre-Anesthesia Assessment:                           - Prior to the procedure, a History and Physical                            was performed, and patient medications and                            allergies were reviewed. The patient's tolerance of                            previous anesthesia was also reviewed. The risks                            and benefits of the procedure and the sedation                            options and risks were discussed with the patient.                            All questions were answered, and informed consent                            was obtained. Prior Anticoagulants: The patient has                            taken no previous anticoagulant or antiplatelet                            agents. ASA Grade Assessment: III - A patient with  severe systemic disease. After reviewing the risks                            and benefits, the patient was deemed in                            satisfactory condition to undergo the procedure.                           After obtaining informed consent, the endoscope was                            passed under direct vision. Throughout the   procedure, the patient's blood pressure, pulse, and                            oxygen saturations were monitored continuously. The                            [id number] ([id number]) scope was introduced through the                            mouth, and advanced to the second part of duodenum.                            The upper GI endoscopy was accomplished without                            difficulty. The patient tolerated the procedure                            well. Scope In: Scope Out: Findings:      One column of non-bleeding grade II varices were found in the distal       esophagus. They were 6 mm in largest diameter. Stigmata of recent       bleeding were evident and red wale signs were present. Scarring from       prior treatment was visible. Four bands were successfully placed with       complete eradication, resulting in deflation of varices. There was no       bleeding during the procedure.      The exam of the esophagus was otherwise normal.      Moderate portal hypertensive gastropathy was found in the entire       examined stomach.      The exam of the stomach was otherwise normal.      The duodenal bulb and second portion of the duodenum were normal. Impression:               - Recently bleeding grade II esophageal varices.                            Completely eradicated. Banded.                           - Portal hypertensive gastropathy.                           -  Normal duodenal bulb and second portion of the                            duodenum.                           - No specimens collected. Moderate Sedation:      none Recommendation:           - Return patient to hospital ward for ongoing care.                           - Clear liquid diet indefinitely.                           - Continue present medications. Procedure Code(s):        --- Professional ---                           [phone], Esophagogastroduodenoscopy, flexible,                             transoral; with band ligation of esophageal/gastric                            varices Diagnosis Code(s):        --- Professional ---                           I85.01, Esophageal varices with bleeding                           K76.6, Portal hypertension                           K31.89, Other diseases of stomach and duodenum                           K92.0, Hematemesis                           K92.1, Melena (includes Hematochezia) CPT copyright 2016 American Medical Association. All rights reserved. The codes documented in this report are preliminary and upon coder review may  be revised to meet current compliance requirements. Ladene Artist, MD 03/16/2017 10:06:11 AM This report has been signed electronically. Number of Addenda: 0

## 2017-03-16 NOTE — Progress Notes (Signed)
Patient went to Endo for EGD and per MD order he will be transferred to stepdown unit. 1 unit blood was given to Endo nurse to be started on Endo.

## 2017-03-16 NOTE — Transfer of Care (Signed)
Immediate Anesthesia Transfer of Care Note  Patient: Francisco [name]  Procedure(s) Performed: Procedure(s): ESOPHAGOGASTRODUODENOSCOPY (EGD) (N/A)  Patient Location: Endoscopy Unit  Anesthesia Type:MAC  Level of Consciousness: awake, alert  and oriented  Airway & Oxygen Therapy: Patient Spontanous Breathing and Patient connected to nasal cannula oxygen  Post-op Assessment: Report given to RN, Post -op Vital signs reviewed and stable and Patient moving all extremities X 4  Post vital signs: Reviewed and stable  Last Vitals:  Vitals:   03/16/17 0838 03/16/17 0857  BP: (!) 93/49 (!) 101/47  Pulse: 70 68  Resp: 18 17  Temp: 36.7 C 36.8 C    Last Pain:  Vitals:   03/16/17 0857  TempSrc: Oral         Complications: No apparent anesthesia complications

## 2017-03-16 NOTE — Anesthesia Preprocedure Evaluation (Addendum)
Anesthesia Evaluation  Patient identified by MRN, date of birth, ID band Patient awake    Reviewed: Allergy & Precautions, NPO status , Patient's Chart, lab work & pertinent test results  Airway Mallampati: II  TM Distance: >3 FB Neck ROM: Full    Dental  (+) Partial Upper   Pulmonary Current Smoker,    breath sounds clear to auscultation       Cardiovascular  Rhythm:Regular Rate:Normal     Neuro/Psych    GI/Hepatic (+)     substance abuse  , Hepatitis -, Cbleed   Endo/Other    Renal/GU      Musculoskeletal  (+) Arthritis ,   Abdominal   Peds  Hematology  (+) anemia ,   Anesthesia Other Findings   Reproductive/Obstetrics                            Anesthesia Physical Anesthesia Plan  ASA: IV  Anesthesia Plan: MAC   Post-op Pain Management:    Induction: Intravenous  Airway Management Planned: Natural Airway  Additional Equipment:   Intra-op Plan:   Post-operative Plan:   Informed Consent:   Plan Discussed with: CRNA  Anesthesia Plan Comments:        Anesthesia Quick Evaluation

## 2017-03-16 NOTE — H&P (View-Only) (Signed)
[facility] Consult: 12:18 PM 03/15/2017  LOS: 0 days    Referring Provider: Dr. Billy Fischer in ED  Primary Care Physician:  No PCP Per Patient Primary Gastroenterologist:  Dr. Carlean Purl.    Reason for Consultation:  Anemia, melena since hematemesis last week.   HPI: Francisco [name] is a 61 y.o. male.  PMH hepatitis C.  Dr Linus Salmons [facility] treatment with durable, sustained viral response (undetectable viral loads).  Hepatic encephalopathy.  No medical insurance.  EGD 07/19/2014 for iron deficiency anemia.  3 columns medium-sized distal and mid third esophageal varices. Band ligation times 4. Portal hypertensive gastropathy. Colonoscopy 07/19/2014 for iron deficiency anemia. Two sessile polyps, 5 and 7 mm sized, in the ascending and descending colon. Otherwise normal mucosa. Pathology:   10/17/2014 EGD.  2 bands placed to 3 columns of 1-2 plus varices in the mid to distal esophagus. Portal gastropathy. 08/2014 ultrasound with elastography: Metavir fibrosis score F4.  Splenomegaly, cholelithiasis.   12/19/2014 EGD.  3 columns of small, flat 1 plus at most esophageal varices in the mid to distal esophagus. Mild to moderate portal gastropathy. No band ligation performed. 11/2015 EGD with band ligation. Dr. Carlean Purl found 3 columns of medium sized mid to distal esophageal varices with no stigmata of bleeding. He had had melena so Dr. Carlean Purl placed 6 bands. Patient had proximal portal gastropathy's but otherwise normal EGD. Patient was started on nadolol 20 mg after this study. 12/29/16 ultrasound of the abdomen.  Showed cirrhosis, no focal hepatic abnormality, small gallstones.  Hgb in February 2017 was 10.8, MCV low at 73. By September 2017 Hgb up to 16.5 and MCV up to 89.  Alpha-fetoprotein 5.4 in 08/2016.   Undetectable hep C virus in 04/2015 and 10/2015.    On Thursday, 5 days ago, he had single episode of coffee-like emesis that looked purple/brown to him.  On that day and through this morning stools were soft, formed but black. Yesterday evening the stool became more of a dark brown color but again this morning they were black. In the last 36 hours he developed orthostatic dizziness.  He's been eating and drinking a little less than usual but no more recurrent vomiting. Because of the dizziness he presented to the emergency room this morning.  In the ED systolic blood pressure around 100 and heart rate in the 120s which have improved with IV fluids. Hemoglobin at 1045 was 10.1, it was rechecked at 11:30 and was down to 7.8 but this was a point-of-care lab, not a serum assay.  Repeat serum hemoglobin/hematocrit are to be collected this afternoon Platelets 163. PT 15, INR 1.18. BUN is elevated in the mid thirties with normal creatinine. Following IV fluids, the patient is feeling well. He has not had confusion. On Saturday night through Sunday he slept 13 hours which is a lot for him but he hasn't had any daytime sleepiness, confusion or mental slowing. He has not been using NSAIDs. Uses a coated 3 2 5  mg aspirin maybe once a month. Last alcohol consumption was in September 2015. Patient  has been compliant with daily lactulose. Patient took nadolol for about 3 months but stopped it because of side effects which he can't quite recall just that it didn't agree with him. Over 4 days, finishing up last Tuesday, he was replacing siding on his home. He describes doing a lot of bending over in order to accomplish the task. So he wonders if all that bending led to some sort of increased abdominal pressure which has now led to the bleeding.    Past Medical History:  Diagnosis Date  . Allergy   . Arthritis   . Benign neoplasm of colon 07/19/2014   07/19/14 - 2 adenomas - repeat colonoscopy 2020   . Chronic  hepatitis C ([place]) 07/19/2014  . Cirrhosis ([place])   . Diverticulosis 2008  . Esophageal varices with bleeding ([place]) 07/19/2014  . Fatty liver 2011   Abd [name]  . Iron deficiency anemia secondary to blood loss (chronic) 07/19/2014  . Portal hypertensive gastropathy 07/19/2014    Past Surgical History:  Procedure Laterality Date  . COLONOSCOPY  2008  . COLONOSCOPY N/A 07/19/2014   Procedure: COLONOSCOPY;  Surgeon: Gatha Mayer, MD;  Location: [facility];  Service: Endoscopy;  Laterality: N/A;  . ESOPHAGOGASTRODUODENOSCOPY N/A 07/19/2014   Procedure: ESOPHAGOGASTRODUODENOSCOPY (EGD);  Surgeon: Gatha Mayer, MD;  Location[facility]COPY;  Service: Endoscopy;  Laterality: N/A;  . ESOPHAGOGASTRODUODENOSCOPY N/A 09/05/2014   Procedure: ESOPHAGOGASTRODUODENOSCOPY (EGD);  Surgeon: Gatha Mayer, MD;  Location: [facility];  Service: Endoscopy;  Laterality: N/A;  . ESOPHAGOGASTRODUODENOSCOPY N/A 10/17/2014   Procedure: ESOPHAGOGASTRODUODENOSCOPY (EGD);  Surgeon: Gatha Mayer, MD;  Location: [facility] END[facility]COPY;  Service: Endoscopy;  Laterality: N/A;  . ESOPHAGOGASTRODUODENOSCOPY N/A 12/19/2014   Procedure: ESOPHAGOGASTRODUODENOSCOPY (EGD);  Surgeon: Gatha Mayer, MD;  Location: [facility];  Service: Endoscopy;  Laterality: N/A;  . ESOPHAGOGASTRODUODENOSCOPY (EGD) WITH PROPOFOL N/A 11/21/2015   Procedure: ESOPHAGOGASTRODUODENOSCOPY (EGD) WITH PROPOFOL;  Surgeon: Gatha Mayer, MD;  Location: [place];  Service: Endoscopy;  Laterality: N/A;  . GASTRIC VARICES BANDING N/A 10/17/2014   Procedure: GASTRIC VARICES BANDING;  Surgeon: Gatha Mayer, MD;  Location: [facility] ENDOSCOPY;  Service: Endoscopy;  Laterality: N/A;  . ORIF ZYGOMATIC FRACTURE Right   . RADIUS SYNOSTOSIS PROXIMAL RESECTION W/ ALLOGRAFT     at the elbow  . thyroidectomy  2012    Prior to Admission medications   Medication Sig Start Date End Date Taking? Authorizing Provider  aspirin 325 MG tablet Take 325 mg by mouth daily as needed  for mild pain or headache.   Yes Historical Provider, MD  Bisacodyl (DULCOLAX PO) Take 2 tablets by mouth daily as needed (constipation).   Yes Historical Provider, MD  Lactulose 20 GM/30ML SOLN Take 1 tbsp by mouth daily 09/01/16  Yes Gatha Mayer, MD  Multiple Vitamin (MULTIVITAMIN) tablet Take 1 tablet by mouth daily.   Yes Historical Provider, MD    Scheduled Meds:  Infusions: . cefTRIAXone (ROCEPHIN)  IV 1 g (03/15/17 1156)  . octreotide  (SANDOSTATIN)    IV infusion 50 mcg/hr (03/15/17 1127)  . pantoprozole (PROTONIX) infusion 8 mg/hr (03/15/17 1126)   PRN Meds:    Allergies as of 03/15/2017 - Review Complete 03/15/2017  Allergen Reaction Noted  . Naproxen Other (See Comments) 01/30/2016    Family History  Problem Relation Age of Onset  . Crohn's disease Father   . Heart disease Maternal Grandmother   . Alcohol abuse Maternal Grandfather   . Cancer Paternal Grandfather  type unknown, mets  . Alcohol abuse Maternal Uncle     several    Social History   Social History  . Marital status: Divorced    Spouse name: N/A  . Number of children: 1  . Years of education: N/A   Occupational History  . 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 Smoking status: Current Every Day Smoker    Packs/day: 0.50    Years: 40.00    Types: Cigarettes    Start date: 05/08/1974  . Smokeless tobacco: Never Used     Comment: cutting back  . Alcohol use 6.0 oz/week    10 Cans of beer per week     Comment: quit 08/27/14  . Drug use: No  . Sexual activity: Not on file   Other Topics Concern  . 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 He had about 4 beers per day (QUIT) and 2 caffeinated beverages per day.    REVIEW OF SYSTEMS: Constitutional: No profound weakness. ENT:  No nose bleeds Pulm:  4 few weeks he's had a mucoid cough. The mucus is clear to  yellowish. He still smoking 1 half pack cigarettes a day compared with 1 pack cigarettes daily in the past. CV:  No palpitations, no LE edema.  No chest pain GU:  No hematuria, no frequency GI:  Per HPI Heme:  Other than the recent GI bleeding, patient has not had any unusual or excessive bleeding or bruising. No difficulty clotting his blood after minor trauma. Transfusions: None Neuro:  No headaches, no peripheral tingling or numbness Derm:  No itching, no rash or sores.  Endocrine:  No sweats or chills.  No polyuria or dysuria ImmuVaccinated for hepatitis A and B 2015 3 2016, completed the series. Pneumococcal vaccine in 2015. Did not inquire as to recent influenza vaccination but he was vaccinated in 2015 Travel:  None beyond local counties in last few months.    PHYSICAL EXAM: Vital signs in last 24 hours: Vitals:   03/15/17 1130 03/15/17 1145  BP: 114/80 132/78  Pulse: (!) 102 91  Resp: (!) 24 (!) 22  Temp:     Wt Readings from Last 3 Encounters:  03/15/17 102.1 kg (225 lb)  12/29/16 105.7 kg (233 lb)  08/31/16 107.3 kg (236 lb 9.6 oz)    GenePleasant, fully alert, non-ill appearing WM Head:  No signs of head trauma. No facial asymmetry or swelling  Eyes:  No scleral icterus. No conjunctival pallor. Ears:    Not hard of hearing  Nose:  No discharge or congestion Mouth:  mucosa moist and clear. Tongue midline. Upper partial denture/bridge. Overall good dental health Neck:  no JVD, thyromegaly or masses. Lungs:  dry rales in the bases. No dyspnea or cough. Heart: RRR. No MRG. S1, S2 present. Abdomen:  Nonobese, soft, nontender. No HSM or masses. Active bowel sounds.   Rectal: did not repeat. Stool was black, FOBT positive per ED physician's rectal exam.   Musc/Skel:  no joint deformity, erythema or swelling Extremities:  No CCE.  Neurologic:  oriented times 3. No asterixis. Moves all 4 limbs, strength not tested. No tremor. Skin:  No telangiectasia, rashes or  sores Tattoos:  None seen Nodes:  no cervical adenopathy   Psych:  pleasant, calm, cooperative.  Intake/Output from previous day: No intake/output data  recorded. Intake/Output this shift: No intake/output data recorded.  LAB RESULTS:  Recent Labs  03/15/17 1046 03/15/17 1129  WBC 12.8*  --   HGB 10.1* 7.8*  HCT 29.5* 23.0*  PLT 163  --    BMET Lab Results  Component Value Date   NA 140 03/15/2017   NA 139 03/15/2017   NA 136 08/31/2016   K 4.4 03/15/2017   K 4.2 03/15/2017   K 4.0 08/31/2016   CL 111 03/15/2017   CL 110 03/15/2017   CL 105 08/31/2016   CO2 21 (L) 03/15/2017   CO2 24 08/31/2016   CO2 26 11/19/2015   GLUCOSE 109 (H) 03/15/2017   GLUCOSE 122 (H) 03/15/2017   GLUCOSE 94 10/15/2016   BUN 37 (H) 03/15/2017   BUN 36 (H) 03/15/2017   BUN 11 08/31/2016   CREATININE 0.70 03/15/2017   CREATININE 0.73 03/15/2017   CREATININE 0.64 08/31/2016   CALCIUM 9.3 03/15/2017   CALCIUM 10.3 08/31/2016   CALCIUM 10.0 11/19/2015   LFT  Recent Labs  03/15/17 1046  PROT 6.5  ALBUMIN 3.5  AST 25  ALT 27  ALKPHOS 69  BILITOT 1.1   PT/INR Lab Results  Component Value Date   INR 1.18 03/15/2017   INR 1.2 (H) 08/31/2016   INR 1.1 (H) 11/19/2015   Hepatitis Panel No results for input(s): HEPBSAG, HCVAB, HEPAIGM, HEPBIGM in the last 72 hours. C-Diff No components found for: CDIFF Lipase  No results found for: LIPASE  Drugs of Abuse  No results found for: LABOPIA, COCAINSCRNUR, LABBENZ, AMPHETMU, THCU, LABBARB   RADIOLOGY STUDIES: No results found.    IMPRESSION:   *  Coffee like emesis last week, melenic stool since.  Dropping Hgb though suspect to be 7.8 level is erroneous..  Started on PPI and octreotide drip.   Suspect portal gastropathy is source as the varices were quite small as of endoscopy 15 months ago.    *  Thrombocytopenia.  Noncritical. This is chronic and dates back to 2011.    PLAN:     *  EGD set for 9:15 for AM tomorrow.   [place] for clears today.  Await repeat serum Hgb.  Leave the Protonix and octreotide drips in place for now.   Azucena Freed  03/15/2017, 12:18 PM Pager: [phone]    Attending physician's note   I have taken a history, examined the patient and reviewed the chart. I agree with the Advanced Practitioner's note, impression and recommendations. Hematemesis and melena with history of cirrhosis, PHG and esophageal varices. Last EGD in 11/2015. IV octreotide, IV PPI, IV Rocephin, trend CBC. EGD tomorrow.   Lucio Edward, MD [facility] [phone] Mon-Fri 8a-5p [phone] after 5p, weekends, holidays

## 2017-03-16 NOTE — Progress Notes (Signed)
Notified md of bp 89/48 hr 68

## 2017-03-16 NOTE — Anesthesia Postprocedure Evaluation (Addendum)
Anesthesia Post Note  Patient: Francisco [name]  Procedure(s) Performed: Procedure(s) (LRB): ESOPHAGOGASTRODUODENOSCOPY (EGD) (N/A)  Patient location during evaluation: Endoscopy Anesthesia Type: MAC Level of consciousness: awake and alert Pain management: pain level controlled Vital Signs Assessment: post-procedure vital signs reviewed and stable Respiratory status: spontaneous breathing, nonlabored ventilation, respiratory function stable and patient connected to nasal cannula oxygen Cardiovascular status: stable and blood pressure returned to baseline Anesthetic complications: no       Last Vitals:  Vitals:   03/16/17 1010 03/16/17 1020  BP: (!) 116/56 (!) 129/56  Pulse: 65 64  Resp: (!) 24 14  Temp: 36.8 C     Last Pain:  Vitals:   03/16/17 1020  TempSrc:   PainSc: 4                  Adea Geisel,JAMES TERRILL

## 2017-03-16 NOTE — Interval H&P Note (Signed)
History and Physical Interval Note:  03/16/2017 9:12 AM  Francisco [name]  has presented today for surgery, with the diagnosis of coffee like emesis,  hx esophageal varices and portal htn.  anemia.  The various methods of treatment have been discussed with the patient and family. After consideration of risks, benefits and other options for treatment, the patient has consented to  Procedure(s): ESOPHAGOGASTRODUODENOSCOPY (EGD) (N/A) as a surgical intervention .  The patient's history has been reviewed, patient examined, no change in status, stable for surgery.  I have reviewed the patient's chart and labs.  Questions were answered to the patient's satisfaction.     Pricilla Riffle. Fuller Plan

## 2017-03-16 NOTE — Progress Notes (Addendum)
Subjective: Currently, the patient is feeling okay, but endorses a dull aching sensation in the mid-chest and epigastrum which is similar to prior experiences post-EGD w/ banding. He denies any other abd pain. No SOB and dizziness w/ sitting/standing has resolved. No further BM ON or today. No further episodes of hematemasis.  Interval Events: Hgb trended down to 7.1. Received 1U pRBCs. EGD w/ evidence of recent variceal bleeding, no active bleeding, 4 varices banded, evidence of portal gastropathy.  Objective: Vital signs in last 24 hours: Vitals:   03/16/17 0950 03/16/17 1000 03/16/17 1010 03/16/17 1020  BP: 103/63 107/60 (!) 116/56 (!) 129/56  Pulse: 67 61 65 64  Resp: 18 (!) 24 (!) 24 14  Temp: 97.8 F (36.6 C)  98.2 F (36.8 C)   TempSrc: Oral  Oral   SpO2: 100% 100% 100% 100%  Weight:      Height:       Physical Exam: Physical Exam  Constitutional: No distress.  Cardiovascular: Normal rate, regular rhythm and normal heart sounds.   Pulmonary/Chest: Effort normal and breath sounds normal.  Abdominal: Soft. Bowel sounds are normal. He exhibits no distension. There is no tenderness. There is no guarding.  Musculoskeletal: He exhibits no edema.   Labs: CBC:  Recent Labs Lab 03/15/17 1046 03/15/17 1129 03/15/17 1907 03/16/17 0648  WBC 12.8*  --   --  3.8*  NEUTROABS 8.8*  --   --   --   HGB 10.1* 7.8* 8.3* 7.1*  HCT 29.5* 23.0* 23.9* 20.8*  MCV 90.2  --   --  91.2  PLT 163  --   --  74*   Metabolic Panel:  Recent Labs Lab 03/15/17 1046 03/15/17 1129 03/16/17 0648  NA 139 140 139  K 4.2 4.4 4.2  CL 110 111 112*  CO2 21*  --  22  GLUCOSE 122* 109* 125*  BUN 36* 37* 19  CREATININE 0.73 0.70 0.79  CALCIUM 9.3  --  8.7*  ALT 27  --   --   ALKPHOS 69  --   --   BILITOT 1.1  --   --   PROT 6.5  --   --   ALBUMIN 3.5  --   --   LABPROT 15.0  --   --   INR 1.18  --   --     Medications: Scheduled Medications: . sodium chloride   Intravenous Once  .  pantoprazole (PROTONIX) IV  40 mg Intravenous Q12H  . sucralfate  1 g Oral TID WC & HS   PRN Medications: ondansetron **OR** ondansetron (ZOFRAN) IV, traMADol  Assessment/Plan: Francisco [name] is a 61 y.o. male with Cirrhosis and h/o variceal bleeding presents w/ hematemasis, melena, and symptomatic anemia 2/2 GIB.  1) GIB 2/2 likely variceal bleed: No further evidence of GIB. Hgb trended down to 7.1 ON and received 1U pRBCs. EGD w/ evidence of recent bleeding varix, s/p banding x 4. GI recs clear diet. Will continue to trend H&H. Dizziness w/ sitting/standing resolved. Able mobilize. - continue telemetry - GI consult, appreciate recs - Pantoprazole 40mg  IV BID - d/c octreotide now >12hr from GIB - Clear liquid diet - Trend CBC in p.m. and a.m. - d/c IVF now with PO hydration - Transfuse for hemoglobin less than 7  2) Hepatic cirrhosis: Currently w/o significant sequelae other than esophageal varices. Does appear to have b/l thrombocytopenia w/o current worsening. h/o Hep C cured w/ Harvoni. Follows regularly w/ GI and has  q 6 mos [name] for [facility] screening. Will likely require regular EGD for monitoring of varices. Will also likely benefit from ppx w/ BB at DC.  Length of Stay: 0 day(s) Dispo: Anticipated discharge in approximately 1-2 day(s) if no further bleeding and H&H stable.  Holley Raring, MD Pager: [phone] (7AM-5PM) 03/16/2017, 11:30 AM

## 2017-03-17 ENCOUNTER — Encounter (HOSPITAL_COMMUNITY): Payer: Self-pay | Admitting: Internal Medicine

## 2017-03-17 DIAGNOSIS — K766 Portal hypertension: Secondary | ICD-10-CM

## 2017-03-17 DIAGNOSIS — I8511 Secondary esophageal varices with bleeding: Secondary | ICD-10-CM

## 2017-03-17 LAB — TYPE AND SCREEN
ABO/RH(D): O POS
ANTIBODY SCREEN: NEGATIVE
Unit division: 0

## 2017-03-17 LAB — BPAM RBC
Blood Product Expiration Date: 201804032359
ISSUE DATE / TIME: 201803270820
Unit Type and Rh: 5100

## 2017-03-17 LAB — CBC
HEMATOCRIT: 23.5 % — AB (ref 39.0–52.0)
Hemoglobin: 8.1 g/dL — ABNORMAL LOW (ref 13.0–17.0)
MCH: 31.4 pg (ref 26.0–34.0)
MCHC: 34.5 g/dL (ref 30.0–36.0)
MCV: 91.1 fL (ref 78.0–100.0)
PLATELETS: 66 10*3/uL — AB (ref 150–400)
RBC: 2.58 MIL/uL — ABNORMAL LOW (ref 4.22–5.81)
RDW: 16.1 % — AB (ref 11.5–15.5)
WBC: 3.4 10*3/uL — ABNORMAL LOW (ref 4.0–10.5)

## 2017-03-17 MED ORDER — SUCRALFATE 1 GM/10ML PO SUSP: 1.0000 g | Freq: Three times a day (TID) | ORAL | 0 refills | Status: DC

## 2017-03-17 MED ORDER — PANTOPRAZOLE SODIUM 40 MG PO TBEC: 40.0000 mg | DELAYED_RELEASE_TABLET | Freq: Every day | ORAL | 0 refills | Status: DC

## 2017-03-17 NOTE — Progress Notes (Signed)
Patient was discharged home by MD order; discharged instructions review and give to patient with care notes; IV DIC; patient will be escorted to the car by a volunteer via wheelchair.  

## 2017-03-17 NOTE — Progress Notes (Signed)
   Subjective: Currently, the patient is feeling well. Tolerating clears yesterday. Ache in chest is resolved. No signs of further bleeding.  Interval Events: Hgb 8.1  Objective: Vital signs in last 24 hours: Vitals:   03/16/17 1020 03/16/17 1300 03/16/17 1511 03/17/17 0515  BP: (!) 129/56 (!) 111/59 (!) 125/56 (!) 105/52  Pulse: 64 74 76 68  Resp: 14 16 18 17   Temp:  97.4 F (36.3 C) 98.7 F (37.1 C) 98 F (36.7 C)  TempSrc:  Oral Oral Oral  SpO2: 100% 97% 99% 98%  Weight:      Height:       Physical Exam: Physical Exam  Constitutional: No distress.  Cardiovascular: Normal rate, regular rhythm and normal heart sounds.   Pulmonary/Chest: Effort normal and breath sounds normal.  Abdominal: Soft. Bowel sounds are normal. He exhibits no distension. There is no tenderness. There is no guarding.  Musculoskeletal: He exhibits no edema.   Labs: CBC:  Recent Labs Lab 03/15/17 1046 03/15/17 1129 03/15/17 1907 03/16/17 0648 03/16/17 1702  WBC 12.8*  --   --  3.8* 4.0  NEUTROABS 8.8*  --   --   --   --   HGB 10.1* 7.8* 8.3* 7.1* 8.2*  HCT 29.5* 23.0* 23.9* 20.8* 23.8*  MCV 90.2  --   --  91.2 90.8  PLT 163  --   --  74* 68*   Metabolic Panel:  Recent Labs Lab 03/15/17 1046 03/15/17 1129 03/16/17 0648  NA 139 140 139  K 4.2 4.4 4.2  CL 110 111 112*  CO2 21*  --  22  GLUCOSE 122* 109* 125*  BUN 36* 37* 19  CREATININE 0.73 0.70 0.79  CALCIUM 9.3  --  8.7*  ALT 27  --   --   ALKPHOS 69  --   --   BILITOT 1.1  --   --   PROT 6.5  --   --   ALBUMIN 3.5  --   --   LABPROT 15.0  --   --   INR 1.18  --   --     Medications: Scheduled Medications: . sodium chloride   Intravenous Once  . pantoprazole (PROTONIX) IV  40 mg Intravenous Q12H  . sucralfate  1 g Oral TID WC & HS   PRN Medications: ondansetron **OR** ondansetron (ZOFRAN) IV, oxyCODONE  Assessment/Plan: [name] is a 61 y.o. male with Cirrhosis and h/o variceal bleeding presents w/  hematemasis, melena, and symptomatic anemia 2/2 GIB.  1) GIB 2/2 likely variceal bleed: No further evidence of GIB. Hgb stable at 8. EGD yesterday w/ evidence of recent bleeding varix, s/p banding x 4, no further evidence of bleeding. - Pantoprazole 40mg  PO BID - advance diet  2) Hepatic cirrhosis: Currently w/o significant sequelae other than esophageal varices. Does appear to have b/l thrombocytopenia w/o current worsening. h/o Hep C cured w/ Harvoni. Follows regularly w/ GI and has q 6 mos [name] for [facility] screening. Will likely require regular EGD for monitoring of varices. Will also likely benefit from ppx w/ BB at DC. Did not tolerate nadolol 2/2 side-effects of fatigue and mental slowing in the past.  Length of Stay: 1 day(s) Dispo: Anticipated discharge today following GI clearance.  Holley Raring, MD Pager: [phone] (7AM-5PM) 03/17/2017, 7:37 AM

## 2017-03-17 NOTE — Care Management Note (Signed)
Case Management Note  Patient Details  Name: Francisco [name] MRN: [id number] Date of Birth: 06/29/1956  Subjective/Objective:          Admitted with Cirrhosis and h/o variceal bleeding presents w/ hematemasis, melena, and symptomatic anemia 2/2 GIB.        s/p  EGD 03/17/2017  w/ evidence of recent bleeding varix, s/p banding x 4,     Action/Plan: Anticipate d/c today pending GI clearance.  Expected Discharge Date:   03/17/2017               Expected Discharge Plan:  Home/Self Care  In-House Referral:     Discharge planning Services  CM Consult   Status of Service:  Completed, signed off  If discussed at [facility] of Stay Meetings, dates discussed:    Additional Comments:  Sharin Mons, RN 03/17/2017, 10:33 AM

## 2017-03-17 NOTE — Progress Notes (Signed)
Daily Rounding Note  03/17/2017, 10:35 AM  LOS: 1 day   SUBJECTIVE:   Chief complaint: black stools.       No BMs since yesterday AM, before EGD.  Black then.  Some chest pain after banding yesterday, but resolved and has not returned.  Tolerating clears.  A little dizzy with mobility earlier this AM, but gone later in AM.  Feels great  OBJECTIVE:         Vital signs in last 24 hours:    Temp:  [97.4 F (36.3 C)-98.7 F (37.1 C)] 98 F (36.7 C) (03/28 0515) Pulse Rate:  [68-76] 68 (03/28 0515) Resp:  [16-18] 17 (03/28 0515) BP: (105-125)/(52-59) 105/52 (03/28 0515) SpO2:  [97 %-99 %] 98 % (03/28 0515) Last BM Date: 03/16/17 Filed Weights   03/15/17 1041 03/15/17 1548  Weight: 102.1 kg (225 lb) 103.6 kg (228 lb 8 oz)   General: looks well   Heart: RRR Chest: clear bil.  No SOB or dyspnea Abdomen: soft, NT, ND.  No mass or HSM.    Extremities: no CCE Neuro/Psych:  Oriented x 3.  No asterixis or limb weakness.  Fully alert.   Intake/Output from previous day: 03/27 0701 - 03/28 0700 In: 1775.4 [P.O.:1140; I.V.:300; Blood:335.4] Out: 20 [Blood:20]  Intake/Output this shift: Total I/O In: 60 [P.O.:60] Out: -   Lab Results:  Recent Labs  03/16/17 0648 03/16/17 1702 03/17/17 0736  WBC 3.8* 4.0 3.4*  HGB 7.1* 8.2* 8.1*  HCT 20.8* 23.8* 23.5*  PLT 74* 68* 66*   BMET  Recent Labs  03/15/17 1046 03/15/17 1129 03/16/17 0648  NA 139 140 139  K 4.2 4.4 4.2  CL 110 111 112*  CO2 21*  --  22  GLUCOSE 122* 109* 125*  BUN 36* 37* 19  CREATININE 0.73 0.70 0.79  CALCIUM 9.3  --  8.7*   LFT  Recent Labs  03/15/17 1046  PROT 6.5  ALBUMIN 3.5  AST 25  ALT 27  ALKPHOS 69  BILITOT 1.1   PT/INR  Recent Labs  03/15/17 1046  LABPROT 15.0  INR 1.18   Hepatitis Panel No results for input(s): HEPBSAG, HCVAB, HEPAIGM, HEPBIGM in the last 72 hours.  Studies/Results: No results found.   Scheduled  Meds: . sodium chloride   Intravenous Once  . pantoprazole (PROTONIX) IV  40 mg Intravenous Q12H  . sucralfate  1 g Oral TID WC & HS   Continuous Infusions: PRN Meds:.ondansetron **OR** ondansetron (ZOFRAN) IV, oxyCODONE  ASSESMENT:   *  Melena, blood loss anemia.  Hx esophageal varices and variceal banding.   3/27 EGD: recently bleeding, single column grade 2 esophageal varices.  4 bands placed.  Scarring from previous banding visible.  No bleeding during procedure.    *  Normocytic anemia.  Stable.    *  Thrombocytopenia.    *  Cirrhosis from Hep C (eradicated with Harvoni) and ? ETOH (abstinent since 08/2014)    PLAN   *  OK for discharge today from GI standpoint.  Daily PPI for 1 month, Carafate suspension qid for 1 week.  No need for Nadolol at this time. ROV with Dr Carlean Purl 5/10 at 10:45 AM.     Azucena Freed  03/17/2017, 10:35 AM Pager: [phone]    Attending physician's note   I have taken an interval history, reviewed the chart and examined the patient. I agree with the Advanced Practitioner's note, impression and recommendations.  Lucio Edward, MD [facility] [phone] Mon-Fri 8a-5p [phone] after 5p, weekends, holidays

## 2017-03-17 NOTE — Discharge Summary (Signed)
Name: Francisco [name] MRN: [id number] DOB: 1956-04-29 61 y.o. PCP: No Pcp Per Patient  Date of Admission: 03/15/2017 10:44 AM Date of Discharge: 03/17/2017 Attending Physician: Truman Hayward, MD  Discharge Diagnosis: Principal Problem:   Bleeding esophageal varices ([place]) Active Problems:   Hepatic cirrhosis ([place])   Portal hypertension ([place])   History of hepatitis C  Discharge Medications: Allergies as of 03/17/2017      Reactions   Naproxen Other (See Comments)   Esophageal bleeding      Medication List    TAKE these medications   aspirin 325 MG tablet Take 325 mg by mouth daily as needed for mild pain or headache.   DULCOLAX PO Take 2 tablets by mouth daily as needed (constipation).   Lactulose 20 GM/30ML Soln Take 1 tbsp by mouth daily   multivitamin tablet Take 1 tablet by mouth daily.   pantoprazole 40 MG tablet Commonly known as:  PROTONIX Take 1 tablet (40 mg total) by mouth daily.   sucralfate 1 GM/10ML suspension Commonly known as:  CARAFATE Take 10 mLs (1 g total) by mouth 4 (four) times daily -  with meals and at bedtime.       Disposition and follow-up:   FranciscoTomothy A [name] was discharged from [facility] in Good condition.  At the hospital follow up visit please address:  1.  GI Bleeding: Ensure GI follow up. May require surverllence EGDs in the future.  2.  Labs / imaging needed at time of follow-up: CBC  Follow-up Appointments: Follow-up Information    Silvano Rusk, MD Follow up on 04/29/2017.   Specialty:  Gastroenterology Why:  10:45 follow up visit.   Contact information: [address] [place] [address] [phone]          Hospital Course by problem list: Principal Problem:   Bleeding esophageal varices ([place]) Active Problems:   Hepatic cirrhosis (H[facility])   Portal hypertension ([place])   History of hepatitis C   1. Bleeding Esophageal Varices 2/2 Cirrhosis: Patient presented to the emergency  department after 2 episodes of dark hematemesis and persistent melena 4 days prior. His hemoglobin was found to be low at 7.8 which trended down to 7.1 on hospital day one and the patient received 1 unit of packed red blood cell transfusion. During his hospitalization he had no further evidence of active GI bleeding. GI was consulted and performed EGD on hospital day one which demonstrated recently bleeding grade 2 esophageal varices. 4 varices were banded. EGD also revealed portal hypertensive gastropathy. The patient was stable on hospital day 2 following his procedure. Hemoglobin remained stable with hemoglobin of 8.1. Patient was discharged to GI follow-up. Of note, pt was previously Hep B surface Ab negative in 2015, but was vaccinated in 2016 and should have immune status. Consider rechecking titer at follow-up.  Discharge Vitals:   BP (!) 105/52 (BP Location: Right Arm)   Pulse 68   Temp 98 F (36.7 C) (Oral)   Resp 17   Ht 6' (1.829 m)   Wt 228 lb 8 oz (103.6 kg)   SpO2 98%   BMI 30.99 kg/m   Pertinent Labs, Studies, and Procedures:  Recent Labs Lab 03/16/17 0648 03/16/17 1702 03/17/17 0736  HGB 7.1* 8.2* 8.1*  HCT 20.8* 23.8* 23.5*  WBC 3.8* 4.0 3.4*  PLT 74* 68* 66*   Procedures Performed:  EGD: - Recently bleeding grade II esophageal varices. Completely eradicated. Banded. - Portal hypertensive gastropathy. -  Normal duodenal bulb and second portion of the duodenum.  Consultations: Treatment Team:  Ladene Artist, MD  Discharge Instructions: Discharge Instructions    Call MD for:  extreme fatigue    Complete by:  As directed    Call MD for:  persistant dizziness or light-headedness    Complete by:  As directed    Call MD for:  persistant nausea and vomiting    Complete by:  As directed    Diet - low sodium heart healthy    Complete by:  As directed    Discharge instructions    Complete by:  As directed    You had bleeding from dilated blood vessels in your  esophagus. These were treated with banding during the EGD procedure and you blood counts have been stable. Please continue to follow up with your GI doctor.   Increase activity slowly    Complete by:  As directed      Signed: Holley Raring, MD 03/17/2017, 11:12 AM   Pager: [phone]

## 2017-03-18 ENCOUNTER — Telehealth: Payer: Self-pay | Admitting: Internal Medicine

## 2017-03-18 DIAGNOSIS — D62 Acute posthemorrhagic anemia: Secondary | ICD-10-CM

## 2017-03-18 NOTE — Telephone Encounter (Signed)
Francisco [name] has been informed about meds and will come get CBC early next week.  I will set up EGD at [facility] and call him back with the details.

## 2017-03-18 NOTE — Telephone Encounter (Signed)
Please advise Sir, thank you. 

## 2017-03-18 NOTE — Telephone Encounter (Signed)
1) Take OTC Nexium qd before breakfast x 1 month or OTC omeprazole 20 mg qd x 1 month 2) I see he has a 5/10 f/u me - he needs to get a CBC on Monday or Tuesday next week  please   dx acute blood loss anemia  3) He is going to need me to repeat EGD and I would set it up for late April hospitalweek if he will - f/u and possibly band varices

## 2017-03-22 ENCOUNTER — Other Ambulatory Visit: Payer: Self-pay | Admitting: Internal Medicine

## 2017-03-22 ENCOUNTER — Other Ambulatory Visit (INDEPENDENT_AMBULATORY_CARE_PROVIDER_SITE_OTHER): Payer: Self-pay

## 2017-03-22 DIAGNOSIS — D62 Acute posthemorrhagic anemia: Secondary | ICD-10-CM

## 2017-03-22 LAB — CBC WITH DIFFERENTIAL/PLATELET
Basophils Absolute: 0 10*3/uL (ref 0.0–0.1)
Basophils Relative: 0.5 % (ref 0.0–3.0)
EOS PCT: 2.5 % (ref 0.0–5.0)
Eosinophils Absolute: 0.1 10*3/uL (ref 0.0–0.7)
LYMPHS ABS: 0.7 10*3/uL (ref 0.7–4.0)
LYMPHS PCT: 24.9 % (ref 12.0–46.0)
MCHC: 33.7 g/dL (ref 30.0–36.0)
MCV: 92.9 fl (ref 78.0–100.0)
MONOS PCT: 8.9 % (ref 3.0–12.0)
Monocytes Absolute: 0.3 10*3/uL (ref 0.1–1.0)
NEUTROS PCT: 63.2 % (ref 43.0–77.0)
Neutro Abs: 1.8 10*3/uL (ref 1.4–7.7)
Platelets: 106 10*3/uL — ABNORMAL LOW (ref 150.0–400.0)
RDW: 16.4 % — ABNORMAL HIGH (ref 11.5–15.5)
WBC: 2.9 10*3/uL — ABNORMAL LOW (ref 4.0–10.5)

## 2017-03-22 NOTE — Progress Notes (Signed)
My Chart note Hgb stable after GI bleed

## 2017-03-22 NOTE — Telephone Encounter (Signed)
Patient informed that EGD will be done 04/14/17 at 11:00AM at [facility] [place].  He is heading over here now to do CBC and will come upstairs and get the instructions.

## 2017-04-13 ENCOUNTER — Encounter (HOSPITAL_COMMUNITY): Payer: Self-pay | Admitting: *Deleted

## 2017-04-13 NOTE — Progress Notes (Signed)
Spoke with dr rose made aware of 03-15-17 ekg results and pt medical history, pt ok for 03-14-17 procedure and do not need to repeat ekf per dr rose.

## 2017-04-14 ENCOUNTER — Encounter (HOSPITAL_COMMUNITY)
Admission: RE | Disposition: A | Discharge status: Home / Self Care | Payer: Self-pay | Source: Ambulatory Visit | Attending: Internal Medicine

## 2017-04-14 ENCOUNTER — Encounter (HOSPITAL_COMMUNITY): Payer: Self-pay

## 2017-04-14 ENCOUNTER — Ambulatory Visit (HOSPITAL_COMMUNITY): Payer: Self-pay | Admitting: Anesthesiology

## 2017-04-14 ENCOUNTER — Ambulatory Visit (HOSPITAL_COMMUNITY)
Admission: RE | Admit: 2017-04-14 | Discharge: 2017-04-14 | Disposition: A | Discharge status: Home / Self Care | Payer: Self-pay | Source: Ambulatory Visit | Attending: Internal Medicine | Admitting: Internal Medicine

## 2017-04-14 ENCOUNTER — Other Ambulatory Visit: Payer: Self-pay | Admitting: Internal Medicine

## 2017-04-14 DIAGNOSIS — K7460 Unspecified cirrhosis of liver: Secondary | ICD-10-CM | POA: Insufficient documentation

## 2017-04-14 DIAGNOSIS — Z7982 Long term (current) use of aspirin: Secondary | ICD-10-CM | POA: Insufficient documentation

## 2017-04-14 DIAGNOSIS — M170 Bilateral primary osteoarthritis of knee: Secondary | ICD-10-CM | POA: Insufficient documentation

## 2017-04-14 DIAGNOSIS — D500 Iron deficiency anemia secondary to blood loss (chronic): Secondary | ICD-10-CM | POA: Insufficient documentation

## 2017-04-14 DIAGNOSIS — Z86010 Personal history of colonic polyps: Secondary | ICD-10-CM | POA: Insufficient documentation

## 2017-04-14 DIAGNOSIS — D62 Acute posthemorrhagic anemia: Secondary | ICD-10-CM

## 2017-04-14 DIAGNOSIS — M19042 Primary osteoarthritis, left hand: Secondary | ICD-10-CM | POA: Insufficient documentation

## 2017-04-14 DIAGNOSIS — E890 Postprocedural hypothyroidism: Secondary | ICD-10-CM | POA: Insufficient documentation

## 2017-04-14 DIAGNOSIS — W01190A Fall on same level from slipping, tripping and stumbling with subsequent striking against furniture, initial encounter: Secondary | ICD-10-CM | POA: Insufficient documentation

## 2017-04-14 DIAGNOSIS — Z8379 Family history of other diseases of the digestive system: Secondary | ICD-10-CM | POA: Insufficient documentation

## 2017-04-14 DIAGNOSIS — Z8719 Personal history of other diseases of the digestive system: Secondary | ICD-10-CM | POA: Insufficient documentation

## 2017-04-14 DIAGNOSIS — Z811 Family history of alcohol abuse and dependence: Secondary | ICD-10-CM | POA: Insufficient documentation

## 2017-04-14 DIAGNOSIS — K3189 Other diseases of stomach and duodenum: Secondary | ICD-10-CM | POA: Insufficient documentation

## 2017-04-14 DIAGNOSIS — F17210 Nicotine dependence, cigarettes, uncomplicated: Secondary | ICD-10-CM | POA: Insufficient documentation

## 2017-04-14 DIAGNOSIS — I8511 Secondary esophageal varices with bleeding: Secondary | ICD-10-CM

## 2017-04-14 DIAGNOSIS — R51 Headache: Secondary | ICD-10-CM | POA: Insufficient documentation

## 2017-04-14 DIAGNOSIS — Z9889 Other specified postprocedural states: Secondary | ICD-10-CM | POA: Insufficient documentation

## 2017-04-14 DIAGNOSIS — K766 Portal hypertension: Secondary | ICD-10-CM | POA: Insufficient documentation

## 2017-04-14 DIAGNOSIS — Z91048 Other nonmedicinal substance allergy status: Secondary | ICD-10-CM | POA: Insufficient documentation

## 2017-04-14 DIAGNOSIS — Z79899 Other long term (current) drug therapy: Secondary | ICD-10-CM | POA: Insufficient documentation

## 2017-04-14 DIAGNOSIS — M19041 Primary osteoarthritis, right hand: Secondary | ICD-10-CM | POA: Insufficient documentation

## 2017-04-14 DIAGNOSIS — B182 Chronic viral hepatitis C: Secondary | ICD-10-CM | POA: Insufficient documentation

## 2017-04-14 DIAGNOSIS — K7031 Alcoholic cirrhosis of liver with ascites: Secondary | ICD-10-CM

## 2017-04-14 DIAGNOSIS — I8500 Esophageal varices without bleeding: Secondary | ICD-10-CM | POA: Insufficient documentation

## 2017-04-14 HISTORY — PX: ESOPHAGOGASTRODUODENOSCOPY (EGD) WITH PROPOFOL: SHX5813

## 2017-04-14 HISTORY — PX: ESOPHAGEAL BANDING: SHX5518

## 2017-04-14 SURGERY — ESOPHAGOGASTRODUODENOSCOPY (EGD) WITH PROPOFOL
Anesthesia: Monitor Anesthesia Care

## 2017-04-14 MED ORDER — HYDROCODONE-ACETAMINOPHEN 5-325 MG PO TABS: 1.0000 | ORAL_TABLET | Freq: Four times a day (QID) | ORAL | 0 refills | Status: DC | PRN

## 2017-04-14 MED ORDER — PROPOFOL 10 MG/ML IV BOLUS
INTRAVENOUS | Status: AC
  Filled 2017-04-14: qty 20

## 2017-04-14 MED ORDER — PROPOFOL 10 MG/ML IV BOLUS
INTRAVENOUS | Status: DC | PRN
  Administered 2017-04-14 (×2): 20 mg via INTRAVENOUS
  Administered 2017-04-14: 10 mg via INTRAVENOUS
  Administered 2017-04-14 (×2): 20 mg via INTRAVENOUS
  Administered 2017-04-14: 40 mg via INTRAVENOUS
  Administered 2017-04-14 (×8): 20 mg via INTRAVENOUS

## 2017-04-14 MED ORDER — SODIUM CHLORIDE 0.9 % IV SOLN: INTRAVENOUS | Status: DC

## 2017-04-14 MED ORDER — LACTATED RINGERS IV SOLN
INTRAVENOUS | Status: DC
  Administered 2017-04-14: 10:00:00 via INTRAVENOUS

## 2017-04-14 MED ORDER — FUROSEMIDE 20 MG PO TABS: 20.0000 mg | ORAL_TABLET | Freq: Every day | ORAL | 5 refills | Status: DC

## 2017-04-14 MED ORDER — SPIRONOLACTONE 50 MG PO TABS: 50.0000 mg | ORAL_TABLET | Freq: Every day | ORAL | 5 refills | Status: DC

## 2017-04-14 SURGICAL SUPPLY — 15 items

## 2017-04-14 NOTE — Anesthesia Postprocedure Evaluation (Signed)
Anesthesia Post Note  Patient: Francisco [name]  Procedure(s) Performed: Procedure(s) (LRB): ESOPHAGOGASTRODUODENOSCOPY (EGD) WITH PROPOFOL (N/A) ESOPHAGEAL BANDING  Patient location during evaluation: PACU Anesthesia Type: MAC Level of consciousness: awake and alert Pain management: pain level controlled Vital Signs Assessment: post-procedure vital signs reviewed and stable Respiratory status: spontaneous breathing, nonlabored ventilation, respiratory function stable and patient connected to nasal cannula oxygen Cardiovascular status: stable and blood pressure returned to baseline Anesthetic complications: no       Last Vitals:  Vitals:   04/14/17 0951 04/14/17 1116  BP: (!) 141/69 139/75  Pulse: (!) 58 68  Resp: 11 19  Temp: 36.5 C 36.4 C    Last Pain:  Vitals:   04/14/17 1116  TempSrc: Oral                 [place] S

## 2017-04-14 NOTE — Transfer of Care (Signed)
Immediate Anesthesia Transfer of Care Note  Patient: Francisco [name]  Procedure(s) Performed: Procedure(s): ESOPHAGOGASTRODUODENOSCOPY (EGD) WITH PROPOFOL (N/A) ESOPHAGEAL BANDING  Patient Location: PACU  Anesthesia Type:MAC  Level of Consciousness: sedated  Airway & Oxygen Therapy: Patient Spontanous Breathing and Patient connected to nasal cannula oxygen  Post-op Assessment: Report given to RN and Post -op Vital signs reviewed and stable  Post vital signs: Reviewed and stable  Last Vitals:  Vitals:   04/14/17 0951  BP: (!) 141/69  Pulse: (!) 58  Resp: 11  Temp: 36.5 C    Last Pain:  Vitals:   04/14/17 0951  TempSrc: Oral         Complications: No apparent anesthesia complications

## 2017-04-14 NOTE — H&P (Signed)
[facility] History and Physical   Primary Care Physician:  No PCP Per Patient   Reason for Procedure:   f/u esophageal varices after bleeding and banding about 1 month ago  Plan:    EGD, possible variceal banding The risks and benefits as well as alternatives of endoscopic procedure(s) have been discussed and reviewed. All questions answered. The patient agrees to proceed.   HPI: Francisco [name] is a 61 y.o. male w/ cirrhosis and hx esophageal varices w/ bleeding s/p banding 1 month ago  He fell yesterday - tripped and hit left forehead on desk - sore there - no LOC, vision ok   Past Medical History:  Diagnosis Date  . Allergy to [place] pig dander   . Arthritis    "hands and knees the most; mild" (03/15/2017)  . Benign neoplasm of colon 07/19/2014   07/19/14 - 2 adenomas - repeat colonoscopy 2020   . Chronic hepatitis C ([place]) 07/19/2014  . Cirrhosis ([place])   . Diverticulosis 2008  . Esophageal varices with bleeding ([place]) 07/19/2014  . Fatty liver 2011   Abd [name]  . GI bleeding 03/15/2017  . History of blood transfusion 1987   "related to the zygomatic arch fracture"  . Iron deficiency anemia secondary to blood loss (chronic) 07/19/2014  . Portal hypertensive gastropathy ([place]) 07/19/2014    Past Surgical History:  Procedure Laterality Date  . COLONOSCOPY  2008  . COLONOSCOPY N/A 07/19/2014   Procedure: COLONOSCOPY;  Surgeon: Gatha Mayer, MD;  Location: [facility];  Service: Endoscopy;  Laterality: N/A;  . ESOPHAGOGASTRODUODENOSCOPY N/A 07/19/2014   Procedure: ESOPHAGOGASTRODUODENOSCOPY (EGD);  Surgeon: Gatha Mayer, MD;  Location: [facility] ENDOSCOPY;  Service: Endoscopy;  Laterality: N/A;  . ESOPHAGOGASTRODUODENOSCOPY N/A 09/05/2014   Procedure: ESOPHAGOGASTRODUODENOSCOPY (EGD);  Surgeon: Gatha Mayer, MD;  Location: [facility] ENDOSCOPY;  Service: Endoscopy;  Laterality: N/A;  . ESOPHAGOGASTRODUODENOSCOPY N/A 10/17/2014   Procedure: ESOPHAGOGASTRODUODENOSCOPY (EGD);   Surgeon: Gatha Mayer, MD;  Location: [facility] ENDOSCOPY;  Service: Endoscopy;  Laterality: N/A;  . ESOPHAGOGASTRODUODENOSCOPY N/A 12/19/2014   Procedure: ESOPHAGOGASTRODUODENOSCOPY (EGD);  Surgeon: Gatha Mayer, MD;  Location: [facility] ENDOSCOPY;  Service: Endoscopy;  Laterality: N/A;  . ESOPHAGOGASTRODUODENOSCOPY N/A 03/16/2017   Procedure: ESOPHAGOGASTRODUODENOSCOPY (EGD);  Surgeon: Ladene Artist, MD;  Location: [facility] ENDOSCOPY;  Service: Endoscopy;  Laterality: N/A;  . ESOPHAGOGASTRODUODENOSCOPY (EGD) WITH PROPOFOL N/A 11/21/2015   Procedure: ESOPHAGOGASTRODUODENOSCOPY (EGD) WITH PROPOFOL;  Surgeon: Gatha Mayer, MD;  Location: [facility];  Service: Endoscopy;  Laterality: N/A;  . FRACTURE SURGERY Right    cheek  . GASTRIC VARICES BANDING N/A 10/17/2014   Procedure: GASTRIC VARICES BANDING;  Surgeon: Gatha Mayer, MD;  Location: [facility] ENDOSCOPY;  Service: Endoscopy;  Laterality: N/A;  . ORIF ZYGOMATIC FRACTURE Right 1987  . RADIUS SYNOSTOSIS PROXIMAL RESECTION W/ ALLOGRAFT Right 1980s   at the elbow  . THYROIDECTOMY, PARTIAL Left 2012    Prior to Admission medications   Medication Sig Start Date End Date Taking? Authorizing Provider  aspirin 325 MG tablet Take 325 mg by mouth daily as needed for mild pain or headache.   Yes Historical Provider, MD  esomeprazole (NEXIUM) 20 MG capsule Take 20 mg by mouth daily at 12 noon.   Yes Historical Provider, MD  ferrous sulfate 325 (65 FE) MG EC tablet Take 325 mg by mouth daily.   Yes Historical Provider, MD  lactulose, encephalopathy, (CHRONULAC) 10 GM/15ML SOLN Take 15 mLs by mouth daily. 02/09/17  Yes Historical Provider, MD  Menthol-Camphor (TIGER BALM ARTHRITIS RUB EX) Apply 1 application topically daily as needed (pain).   Yes Historical Provider, MD  Multiple Vitamin (MULTIVITAMIN) tablet Take 1 tablet by mouth daily.   Yes Historical Provider, MD  pantoprazole (PROTONIX) 40 MG tablet Take 1 tablet (40 mg total) by mouth daily. Patient not taking:  Reported on 04/09/2017 03/17/17 04/16/17  Holley Raring, MD  sucralfate (CARAFATE) 1 GM/10ML suspension Take 10 mLs (1 g total) by mouth 4 (four) times daily -  with meals and at bedtime. Patient not taking: Reported on 04/09/2017 03/17/17 04/09/17  Holley Raring, MD    Current Facility-Administered Medications  Medication Dose Route Frequency Provider Last Rate Last Dose  . 0.9 %  sodium chloride infusion   Intravenous Continuous Gatha Mayer, MD      . lactated ringers infusion   Intravenous Continuous Gatha Mayer, MD 125 mL/hr at 04/14/17 0957      Allergies as of 03/22/2017 - Review Complete 03/16/2017  Allergen Reaction Noted  . Naproxen Other (See Comments) 01/30/2016    Family History  Problem Relation Age of Onset  . Crohn's disease Father   . Heart disease Maternal Grandmother   . Alcohol abuse Maternal Grandfather   . Cancer Paternal Grandfather     type unknown, mets  . Alcohol abuse Maternal Uncle     several    Social History   Social History  . Marital status: Divorced    Spouse name: N/A  . Number of children: 1  . Years of education: N/A   Occupational History  . 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 Smoking status: Current Every Day Smoker    Packs/day: 0.50    Years: 43.00    Types: Cigarettes    Start date: 05/08/1974  . Smokeless tobacco: Never Used     Comment: 03-13-17 occ cigarette  . Alcohol use No     Comment: 03/15/2017 "used to drink considerably; quit 08/27/14"  . Drug use: No  . Sexual activity: Not on file   Other Topics Concern  . 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 He had about 4 beers per day (QUIT) and 2 caffeinated beverages per day.    Review of Systems: Positive for as per HPI All other review of systems negative except as mentioned in the HPI.  Physical Exam: Vital signs in last 24  hours: Temp:  [97.7 F (36.5 C)] 97.7 F (36.5 C) (04/25 0951) Pulse Rate:  [58] 58 (04/25 0951) Resp:  [11] 11 (04/25 0951) BP: (141)/(69) 141/69 (04/25 0951) SpO2:  [99 %] 99 % (04/25 0951) Weight:  [228 lb (103.4 kg)] 228 lb (103.4 kg) (04/25 0951)   General:   Alert,  Well-developed, well-nourished, pleasant and cooperative in NAD Lungs:  Clear throughout to auscultation.   Heart:  Regular rate and rhythm; no murmurs, clicks, rubs,  or gallops. Abdomen:  Soft, nontender and distended with moderate ascites, soft umbilical hernia. Normal bowel sounds.   Neuro/Psych:  Alert and cooperative. Normal mood and affect. A and O x 3 nonfocal Left periorbital ecchymosis - eye clear - small hematoma and tender area over the left brow    @Rino Hosea  Simonne Maffucci, MD, [facility] [phone] (pager) 04/14/2017 10:45 AM@

## 2017-04-14 NOTE — Discharge Instructions (Addendum)
° °  I placed 2 bands today. You are swollen with fluid and I am prescribing medication to remove it through urination - medications called diuretics. Please do not add salt to your food - it can make you swell. Keep track of your weight daily or at least a few times a week - write it down.  On or after 5/3 but by 5/10 please go tho the lab at my office and have blood tests done and I will contact you about the results and plans.   If you have a headache its ok to take acetaminophen 500 mg 2 tabs up to 2000 mg in a day as needed. Do not use aspirin or similar medications.  I appreciate the opportunity to care for you. Gatha Mayer, MD, FACG     YOU HAD AN ENDOSCOPIC PROCEDURE TODAY: Refer to the procedure report and other information in the discharge instructions given to you for any specific questions about what was found during the examination. If this information does not answer your questions, please call [facility] office at [phone] to clarify.   YOU SHOULD EXPECT: Some feelings of bloating in the abdomen. Passage of more gas than usual. Walking can help get rid of the air that was put into your GI tract during the procedure and reduce the bloating. If you had a lower endoscopy (such as a colonoscopy or flexible sigmoidoscopy) you may notice spotting of blood in your stool or on the toilet paper. Some abdominal soreness may be present for a day or two, also.  DIET: Your first meal following the procedure should be a light meal and then it is ok to progress to your normal diet. A half-sandwich or bowl of soup is an example of a good first meal. Heavy or fried foods are harder to digest and may make you feel nauseous or bloated. Drink plenty of fluids but you should avoid alcoholic beverages for 24 hours. If you had a esophageal dilation, please see attached instructions for diet.    ACTIVITY: Your care partner should take you home directly after the procedure. You should plan to  take it easy, moving slowly for the rest of the day. You can resume normal activity the day after the procedure however YOU SHOULD NOT DRIVE, use power tools, machinery or perform tasks that involve climbing or major physical exertion for 24 hours (because of the sedation medicines used during the test).   SYMPTOMS TO REPORT IMMEDIATELY: A gastroenterologist can be reached at any hour. Please call [phone]  for any of the following symptoms:  Following lower endoscopy (colonoscopy, flexible sigmoidoscopy) Excessive amounts of blood in the stool  Significant tenderness, worsening of abdominal pains  Swelling of the abdomen that is new, acute  Fever of 100 or higher  Following upper endoscopy (EGD, EUS, ERCP, esophageal dilation) Vomiting of blood or coffee ground material  New, significant abdominal pain  New, significant chest pain or pain under the shoulder blades  Painful or persistently difficult swallowing  New shortness of breath  Black, tarry-looking or red, bloody stools  FOLLOW UP:  If any biopsies were taken you will be contacted by phone or by letter within the next 1-3 weeks. Call [phone]  if you have not heard about the biopsies in 3 weeks.  Please also call with any specific questions about appointments or follow up tests.

## 2017-04-14 NOTE — Op Note (Addendum)
[facility] Patient Name: Francisco [name] Procedure Date: 04/14/2017 MRN: [id number] Attending MD: Gatha Mayer , MD Date of Birth: 08/27/1956 CSN: [id number] Age: 61 Admit Type: Outpatient Procedure:                Upper GI endoscopy Indications:              Follow-up of esophageal varices, For therapy of                            esophageal varices Providers:                Gatha Mayer, MD, Kingsley Plan, RN, William Dalton, Technician Referring MD:              Medicines:                Propofol per Anesthesia, Monitored Anesthesia Care Complications:            No immediate complications. Estimated Blood Loss:     Estimated blood loss was minimal. Procedure:                Pre-Anesthesia Assessment:                           - Prior to the procedure, a History and Physical                            was performed, and patient medications and                            allergies were reviewed. The patient's tolerance of                            previous anesthesia was also reviewed. The risks                            and benefits of the procedure and the sedation                            options and risks were discussed with the patient.                            All questions were answered, and informed consent                            was obtained. Prior Anticoagulants: The patient has                            taken no previous anticoagulant or antiplatelet                            agents. ASA Grade Assessment: III - A patient with  severe systemic disease. After reviewing the risks                            and benefits, the patient was deemed in                            satisfactory condition to undergo the procedure.                           After obtaining informed consent, the endoscope was                            passed under direct vision. Throughout the           procedure, the patient's blood pressure, pulse, and                            oxygen saturations were monitored continuously. The                            EG-[id number] ([id number]) scope was introduced through the                            mouth, and advanced to the prepyloric region,                            stomach. The upper GI endoscopy was accomplished                            without difficulty. The patient tolerated the                            procedure well. Scope In: Scope Out: Findings:      Large (> 5 mm) varices were found in the distal esophagus. One column no       stigmata - could see scars from recent banding in other areas -       flattened. Varyx was 5 mm in largest diameter. Two bands were       successfully placed with complete eradication, resulting in deflation of       varices. Estimated blood loss was minimal.      Severe portal hypertensive gastropathy was found in the entire examined       stomach.      The exam was otherwise without abnormality.      The cardia and gastric fundus were otherwise normal on retroflexion. Impression:               - Large (> 5 mm) esophageal varices. Completely                            eradicated. Banded.                           - Portal hypertensive gastropathy.                           - The examination was  otherwise normal.                           - No specimens collected. Moderate Sedation:      N/A- Per Anesthesia Care Recommendation:           - Patient has a contact number available for                            emergencies. The signs and symptoms of potential                            delayed complications were discussed with the                            patient. Return to normal activities tomorrow.                            Written discharge instructions were provided to the                            patient.                           - Resume previous diet.                           -  Continue present medications.                           - Start furoosemide 20 mg and spironolactone 50 mg                            q AM for edema amnd ascites                           Labs at my office around 5/3 (I ordered) CBC, BMET,                            NH3, B12, ferritin                           Further f/u after that                           - Repeat upper endoscopy in 3 months for                            retreatment and for surveillance.                           - Return to GI office at appointment to be                            scheduled. After I review labs                           -  Vicodin 5/325 #10 q 6 hrs prn for post-banding                            pain was Rxed Procedure Code(s):        --- Professional ---                           [id number], Esophagoscopy, flexible, transoral; with                            band ligation of esophageal varices Diagnosis Code(s):        --- Professional ---                           I85.00, Esophageal varices without bleeding                           K76.6, Portal hypertension                           K31.89, Other diseases of stomach and duodenum CPT copyright 2016 American Medical Association. All rights reserved. The codes documented in this report are preliminary and upon coder review may  be revised to meet current compliance requirements. Gatha Mayer, MD 04/14/2017 11:33:31 AM This report has been signed electronically. Number of Addenda: 0

## 2017-04-14 NOTE — Anesthesia Preprocedure Evaluation (Signed)
Anesthesia Evaluation  Patient identified by MRN, date of birth, ID band Patient awake    Reviewed: Allergy & Precautions, NPO status , Patient's Chart, lab work & pertinent test results  Airway Mallampati: II  TM Distance: >3 FB Neck ROM: Full    Dental no notable dental hx.    Pulmonary neg pulmonary ROS, Current Smoker,    Pulmonary exam normal breath sounds clear to auscultation       Cardiovascular negative cardio ROS Normal cardiovascular exam Rhythm:Regular Rate:Normal     Neuro/Psych negative neurological ROS  negative psych ROS   GI/Hepatic negative GI ROS, (+) Cirrhosis   Esophageal Varices    , Hepatitis -, C  Endo/Other  negative endocrine ROS  Renal/GU negative Renal ROS  negative genitourinary   Musculoskeletal negative musculoskeletal ROS (+)   Abdominal   Peds negative pediatric ROS (+)  Hematology negative hematology ROS (+)   Anesthesia Other Findings   Reproductive/Obstetrics negative OB ROS                             Anesthesia Physical Anesthesia Plan  ASA: III  Anesthesia Plan: MAC   Post-op Pain Management:    Induction: Intravenous  Airway Management Planned: Nasal Cannula  Additional Equipment:   Intra-op Plan:   Post-operative Plan:   Informed Consent: I have reviewed the patients History and Physical, chart, labs and discussed the procedure including the risks, benefits and alternatives for the proposed anesthesia with the patient or authorized representative who has indicated his/her understanding and acceptance.   Dental advisory given  Plan Discussed with: CRNA and Surgeon  Anesthesia Plan Comments:         Anesthesia Quick Evaluation

## 2017-04-15 ENCOUNTER — Encounter (HOSPITAL_COMMUNITY): Payer: Self-pay | Admitting: Internal Medicine

## 2017-04-21 ENCOUNTER — Other Ambulatory Visit: Payer: Self-pay

## 2017-04-21 DIAGNOSIS — I8500 Esophageal varices without bleeding: Secondary | ICD-10-CM

## 2017-04-21 NOTE — Progress Notes (Signed)
Case scheduled with Maudie Mercury at [facility] and case number is [id number]. Patient notified I will mail him instructions.

## 2017-04-22 ENCOUNTER — Other Ambulatory Visit (INDEPENDENT_AMBULATORY_CARE_PROVIDER_SITE_OTHER): Payer: Self-pay

## 2017-04-22 DIAGNOSIS — K7031 Alcoholic cirrhosis of liver with ascites: Secondary | ICD-10-CM

## 2017-04-22 DIAGNOSIS — D62 Acute posthemorrhagic anemia: Secondary | ICD-10-CM

## 2017-04-22 LAB — CBC WITH DIFFERENTIAL/PLATELET
Basophils Absolute: 0 10*3/uL (ref 0.0–0.1)
Basophils Relative: 0.7 % (ref 0.0–3.0)
EOS PCT: 3 % (ref 0.0–5.0)
Eosinophils Absolute: 0.1 10*3/uL (ref 0.0–0.7)
HCT: 40.8 % (ref 39.0–52.0)
HEMOGLOBIN: 13.4 g/dL (ref 13.0–17.0)
Lymphocytes Relative: 28.1 % (ref 12.0–46.0)
Lymphs Abs: 1.1 10*3/uL (ref 0.7–4.0)
MCHC: 32.8 g/dL (ref 30.0–36.0)
MCV: 86.4 fl (ref 78.0–100.0)
MONO ABS: 0.4 10*3/uL (ref 0.1–1.0)
Monocytes Relative: 10.2 % (ref 3.0–12.0)
Neutro Abs: 2.2 10*3/uL (ref 1.4–7.7)
Neutrophils Relative %: 58 % (ref 43.0–77.0)
Platelets: 99 10*3/uL — ABNORMAL LOW (ref 150.0–400.0)
RBC: 4.73 Mil/uL (ref 4.22–5.81)
RDW: 16.9 % — ABNORMAL HIGH (ref 11.5–15.5)
WBC: 3.8 10*3/uL — AB (ref 4.0–10.5)

## 2017-04-22 LAB — BASIC METABOLIC PANEL
BUN: 15 mg/dL (ref 6–23)
CO2: 24 mEq/L (ref 19–32)
Calcium: 10.8 mg/dL — ABNORMAL HIGH (ref 8.4–10.5)
Chloride: 106 mEq/L (ref 96–112)
Creatinine, Ser: 0.77 mg/dL (ref 0.40–1.50)
GFR: 109.12 mL/min (ref >60.00)
Glucose, Bld: 78 mg/dL (ref 70–99)
Potassium: 4.2 mEq/L (ref 3.5–5.1)
SODIUM: 137 meq/L (ref 135–145)

## 2017-04-22 LAB — VITAMIN B12: VITAMIN B 12: 356 pg/mL (ref 211–911)

## 2017-04-22 LAB — FERRITIN: Ferritin: 21.3 ng/mL — ABNORMAL LOW (ref 22.0–322.0)

## 2017-04-22 LAB — AMMONIA: Ammonia: 43 umol/L — ABNORMAL HIGH (ref 11–35)

## 2017-04-23 NOTE — Progress Notes (Signed)
My chart note Ca sl high Hgb better NH3 sl high

## 2017-04-29 ENCOUNTER — Ambulatory Visit: Payer: Self-pay | Admitting: Internal Medicine

## 2017-05-21 NOTE — Addendum Note (Signed)
Addendum  created 05/21/17 1257 by Rica Koyanagi, MD   Sign clinical note

## 2017-05-24 NOTE — Anesthesia Postprocedure Evaluation (Signed)
Anesthesia Post Note  Patient: Francisco [name]  Procedure(s) Performed: Procedure(s) (LRB): ESOPHAGOGASTRODUODENOSCOPY (EGD) WITH PROPOFOL (N/A) ESOPHAGEAL BANDING     Anesthesia Post Evaluation  Last Vitals:  Vitals:   04/14/17 1130 04/14/17 1140  BP: (!) 144/84 (!) 147/79  Pulse: 62 60  Resp: 15 11  Temp:      Last Pain:  Vitals:   04/20/17 0950  TempSrc:   PainSc: 0-No pain                 Mame Twombly S

## 2017-05-24 NOTE — Addendum Note (Signed)
Addendum  created 05/24/17 1347 by Myrtie Soman, MD   Sign clinical note

## 2017-05-25 ENCOUNTER — Telehealth: Payer: Self-pay

## 2017-05-25 DIAGNOSIS — K7460 Unspecified cirrhosis of liver: Principal | ICD-10-CM

## 2017-05-25 DIAGNOSIS — B182 Chronic viral hepatitis C: Secondary | ICD-10-CM

## 2017-05-25 NOTE — Telephone Encounter (Signed)
  I spoke with Elta Guadeloupe and got his preferences for his U/S time.  I have it set up for July 9th at 9:00AM, arrive at 8:45AM at [facility] radiology. NPO 6 hours.  Francisco [name] has an appointment with Dr Carlean Purl on July 17th at 10:00AM to discuss the results.  Patient is aware of these dates/times.

## 2017-05-25 NOTE — Telephone Encounter (Signed)
-----   Message from Rik Wadel E [place], [place] sent at 12/29/2016  3:41 PM EST ----- Call patient and set up abdominal u/s for his Hep C with cirrhosis, screening for liver cancer.  Due early July.  Also set up office visit with Dr Carlean Purl to discuss results.

## 2017-05-26 ENCOUNTER — Other Ambulatory Visit: Payer: Self-pay | Admitting: Internal Medicine

## 2017-05-26 DIAGNOSIS — K7460 Unspecified cirrhosis of liver: Principal | ICD-10-CM

## 2017-05-26 DIAGNOSIS — B182 Chronic viral hepatitis C: Secondary | ICD-10-CM

## 2017-06-28 ENCOUNTER — Ambulatory Visit (HOSPITAL_COMMUNITY)
Admission: RE | Admit: 2017-06-28 | Discharge: 2017-06-28 | Disposition: A | Discharge status: Home / Self Care | Payer: Self-pay | Source: Ambulatory Visit | Attending: Internal Medicine | Admitting: Internal Medicine

## 2017-06-28 DIAGNOSIS — K8020 Calculus of gallbladder without cholecystitis without obstruction: Secondary | ICD-10-CM | POA: Insufficient documentation

## 2017-06-28 DIAGNOSIS — K7460 Unspecified cirrhosis of liver: Secondary | ICD-10-CM | POA: Insufficient documentation

## 2017-06-28 DIAGNOSIS — B182 Chronic viral hepatitis C: Secondary | ICD-10-CM | POA: Insufficient documentation

## 2017-06-28 NOTE — Progress Notes (Signed)
[name] is ok - stable cirrhosis No recall now but will discuss at next REV

## 2017-07-06 ENCOUNTER — Other Ambulatory Visit (INDEPENDENT_AMBULATORY_CARE_PROVIDER_SITE_OTHER): Payer: Self-pay

## 2017-07-06 ENCOUNTER — Encounter: Payer: Self-pay | Admitting: Internal Medicine

## 2017-07-06 ENCOUNTER — Encounter: Payer: Self-pay | Admitting: Neurology

## 2017-07-06 ENCOUNTER — Ambulatory Visit (INDEPENDENT_AMBULATORY_CARE_PROVIDER_SITE_OTHER): Payer: Self-pay | Admitting: Internal Medicine

## 2017-07-06 VITALS — BP 116/76 | HR 68 | Ht 72.0 in | Wt 225.4 lb

## 2017-07-06 DIAGNOSIS — I8511 Secondary esophageal varices with bleeding: Secondary | ICD-10-CM

## 2017-07-06 DIAGNOSIS — K7460 Unspecified cirrhosis of liver: Secondary | ICD-10-CM

## 2017-07-06 DIAGNOSIS — Z23 Encounter for immunization: Secondary | ICD-10-CM

## 2017-07-06 DIAGNOSIS — G44329 Chronic post-traumatic headache, not intractable: Secondary | ICD-10-CM

## 2017-07-06 DIAGNOSIS — D62 Acute posthemorrhagic anemia: Secondary | ICD-10-CM

## 2017-07-06 DIAGNOSIS — B182 Chronic viral hepatitis C: Secondary | ICD-10-CM

## 2017-07-06 LAB — FERRITIN: FERRITIN: 38.1 ng/mL (ref 22.0–322.0)

## 2017-07-06 LAB — COMPREHENSIVE METABOLIC PANEL
ALBUMIN: 4.3 g/dL (ref 3.5–5.2)
ALT: 27 U/L (ref 0–53)
AST: 23 U/L (ref 0–37)
Alkaline Phosphatase: 110 U/L (ref 39–117)
BILIRUBIN TOTAL: 0.5 mg/dL (ref 0.2–1.2)
BUN: 11 mg/dL (ref 6–23)
CHLORIDE: 106 meq/L (ref 96–112)
CO2: 24 meq/L (ref 19–32)
CREATININE: 0.76 mg/dL (ref 0.40–1.50)
Calcium: 11 mg/dL — ABNORMAL HIGH (ref 8.4–10.5)
GFR: 110.7 mL/min (ref >60.00)
Glucose, Bld: 99 mg/dL (ref 70–99)
Potassium: 4.1 mEq/L (ref 3.5–5.1)
SODIUM: 137 meq/L (ref 135–145)
Total Protein: 8.3 g/dL (ref 6.0–8.3)

## 2017-07-06 LAB — CBC WITH DIFFERENTIAL/PLATELET
BASOS PCT: 0.5 % (ref 0.0–3.0)
Basophils Absolute: 0 10*3/uL (ref 0.0–0.1)
EOS PCT: 2.9 % (ref 0.0–5.0)
Eosinophils Absolute: 0.1 10*3/uL (ref 0.0–0.7)
HEMATOCRIT: 43.8 % (ref 39.0–52.0)
HEMOGLOBIN: 15 g/dL (ref 13.0–17.0)
LYMPHS PCT: 24.8 % (ref 12.0–46.0)
Lymphs Abs: 1.2 10*3/uL (ref 0.7–4.0)
MCHC: 34.1 g/dL (ref 30.0–36.0)
MCV: 81.8 fl (ref 78.0–100.0)
MONO ABS: 0.4 10*3/uL (ref 0.1–1.0)
MONOS PCT: 8 % (ref 3.0–12.0)
Neutro Abs: 3 10*3/uL (ref 1.4–7.7)
Neutrophils Relative %: 63.8 % (ref 43.0–77.0)
Platelets: 87 10*3/uL — ABNORMAL LOW (ref 150.0–400.0)
RBC: 5.35 Mil/uL (ref 4.22–5.81)
RDW: 18.2 % — ABNORMAL HIGH (ref 11.5–15.5)
WBC: 4.7 10*3/uL (ref 4.0–10.5)

## 2017-07-06 LAB — AMMONIA: AMMONIA: 46 umol/L — AB (ref 11–35)

## 2017-07-06 LAB — PROTIME-INR
INR: 1.2 ratio — ABNORMAL HIGH (ref 0.8–1.0)
Prothrombin Time: 13.4 s — ABNORMAL HIGH (ref 9.6–13.1)
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
## 2017-07-06 NOTE — Patient Instructions (Signed)
Today we are giving you a Prevnar 13 vaccine.   Your physician has requested that you go to the basement for the following lab work before leaving today: CBC, Ferritin, INR, CMET, Ammonia    Restart your Lactulose today per Dr Carlean Purl.   We will put you in the system for a 6 month recall for your ultra sound.   We are referring you to Dr Tomi Likens at [facility] for your headaches.   I appreciate the opportunity to care for you. Silvano Rusk, MD, [facility]

## 2017-07-09 NOTE — Progress Notes (Signed)
My chart message the patient

## 2017-07-23 ENCOUNTER — Encounter (HOSPITAL_COMMUNITY): Payer: Self-pay

## 2017-07-23 ENCOUNTER — Encounter (HOSPITAL_COMMUNITY)
Admission: RE | Disposition: A | Discharge status: Home / Self Care | Payer: Self-pay | Source: Ambulatory Visit | Attending: Internal Medicine

## 2017-07-23 ENCOUNTER — Encounter (HOSPITAL_COMMUNITY): Payer: Self-pay | Admitting: *Deleted

## 2017-07-23 ENCOUNTER — Ambulatory Visit (HOSPITAL_COMMUNITY): Payer: Self-pay | Admitting: Anesthesiology

## 2017-07-23 ENCOUNTER — Ambulatory Visit (HOSPITAL_COMMUNITY): Admit: 2017-07-23 | Payer: Self-pay | Admitting: Internal Medicine

## 2017-07-23 ENCOUNTER — Ambulatory Visit (HOSPITAL_COMMUNITY)
Admission: RE | Admit: 2017-07-23 | Discharge: 2017-07-23 | Disposition: A | Discharge status: Home / Self Care | Payer: Self-pay | Source: Ambulatory Visit | Attending: Internal Medicine | Admitting: Internal Medicine

## 2017-07-23 DIAGNOSIS — I8511 Secondary esophageal varices with bleeding: Secondary | ICD-10-CM | POA: Insufficient documentation

## 2017-07-23 DIAGNOSIS — M19041 Primary osteoarthritis, right hand: Secondary | ICD-10-CM | POA: Insufficient documentation

## 2017-07-23 DIAGNOSIS — D500 Iron deficiency anemia secondary to blood loss (chronic): Secondary | ICD-10-CM | POA: Insufficient documentation

## 2017-07-23 DIAGNOSIS — K766 Portal hypertension: Secondary | ICD-10-CM | POA: Insufficient documentation

## 2017-07-23 DIAGNOSIS — K3189 Other diseases of stomach and duodenum: Secondary | ICD-10-CM | POA: Insufficient documentation

## 2017-07-23 DIAGNOSIS — M170 Bilateral primary osteoarthritis of knee: Secondary | ICD-10-CM | POA: Insufficient documentation

## 2017-07-23 DIAGNOSIS — Z79899 Other long term (current) drug therapy: Secondary | ICD-10-CM | POA: Insufficient documentation

## 2017-07-23 DIAGNOSIS — M19042 Primary osteoarthritis, left hand: Secondary | ICD-10-CM | POA: Insufficient documentation

## 2017-07-23 DIAGNOSIS — E890 Postprocedural hypothyroidism: Secondary | ICD-10-CM | POA: Insufficient documentation

## 2017-07-23 DIAGNOSIS — K7469 Other cirrhosis of liver: Secondary | ICD-10-CM | POA: Insufficient documentation

## 2017-07-23 DIAGNOSIS — F17210 Nicotine dependence, cigarettes, uncomplicated: Secondary | ICD-10-CM | POA: Insufficient documentation

## 2017-07-23 DIAGNOSIS — I8500 Esophageal varices without bleeding: Secondary | ICD-10-CM

## 2017-07-23 DIAGNOSIS — B182 Chronic viral hepatitis C: Secondary | ICD-10-CM | POA: Insufficient documentation

## 2017-07-23 DIAGNOSIS — Z86010 Personal history of colonic polyps: Secondary | ICD-10-CM | POA: Insufficient documentation

## 2017-07-23 DIAGNOSIS — Z888 Allergy status to other drugs, medicaments and biological substances status: Secondary | ICD-10-CM | POA: Insufficient documentation

## 2017-07-23 DIAGNOSIS — G44329 Chronic post-traumatic headache, not intractable: Secondary | ICD-10-CM | POA: Insufficient documentation

## 2017-07-23 DIAGNOSIS — Z23 Encounter for immunization: Secondary | ICD-10-CM | POA: Insufficient documentation

## 2017-07-23 HISTORY — DX: Unspecified fall, initial encounter: W19.XXXA

## 2017-07-23 HISTORY — DX: Post-traumatic headache, unspecified, not intractable: S09.90XS

## 2017-07-23 HISTORY — DX: Repeated falls: R29.6

## 2017-07-23 HISTORY — DX: Unspecified injury of head, sequela: S09.90XS

## 2017-07-23 HISTORY — DX: Post-traumatic headache, unspecified, not intractable: G44.309

## 2017-07-23 HISTORY — PX: ESOPHAGOGASTRODUODENOSCOPY (EGD) WITH PROPOFOL: SHX5813

## 2017-07-23 SURGERY — ESOPHAGOGASTRODUODENOSCOPY (EGD) WITH PROPOFOL
Anesthesia: Monitor Anesthesia Care

## 2017-07-23 MED ORDER — LACTATED RINGERS IV SOLN
INTRAVENOUS | Status: DC
  Administered 2017-07-23: 1000 mL via INTRAVENOUS
  Administered 2017-07-23: 08:00:00 via INTRAVENOUS

## 2017-07-23 MED ORDER — SODIUM CHLORIDE 0.9 % IV SOLN: INTRAVENOUS | Status: DC

## 2017-07-23 MED ORDER — PROPOFOL 10 MG/ML IV BOLUS
INTRAVENOUS | Status: AC
  Filled 2017-07-23: qty 20

## 2017-07-23 MED ORDER — PROPOFOL 500 MG/50ML IV EMUL
INTRAVENOUS | Status: DC | PRN
  Administered 2017-07-23: 150 ug/kg/min via INTRAVENOUS

## 2017-07-23 MED ORDER — PROPOFOL 10 MG/ML IV BOLUS
INTRAVENOUS | Status: DC | PRN
  Administered 2017-07-23: 20 mg via INTRAVENOUS
  Administered 2017-07-23 (×3): 30 mg via INTRAVENOUS
  Administered 2017-07-23: 20 mg via INTRAVENOUS

## 2017-07-23 MED ORDER — PROPOFOL 10 MG/ML IV BOLUS
INTRAVENOUS | Status: AC
  Filled 2017-07-23: qty 40

## 2017-07-23 SURGICAL SUPPLY — 15 items

## 2017-07-23 NOTE — Op Note (Signed)
[facility] Patient Name: Francisco [name] Procedure Date: 07/23/2017 MRN: [id number] Attending MD: Gatha Mayer , MD Date of Birth: [birth year] CSN: [id number] Age: 61 Admit Type: Outpatient Procedure:                Upper GI endoscopy Indications:              Esophageal varices, Follow-up of esophageal                            varices, For therapy of esophageal varices Providers:                Gatha Mayer, MD, Lillie Fragmin, RN, Cherylynn Ridges, Technician, Rosario Adie, CRNA Referring MD:              Medicines:                Propofol per Anesthesia Complications:            No immediate complications. Estimated Blood Loss:     Estimated blood loss: none. Procedure:                Pre-Anesthesia Assessment:                           - Prior to the procedure, a History and Physical                            was performed, and patient medications and                            allergies were reviewed. The patient's tolerance of                            previous anesthesia was also reviewed. The risks                            and benefits of the procedure and the sedation                            options and risks were discussed with the patient.                            All questions were answered, and informed consent                            was obtained. Prior Anticoagulants: The patient has                            taken no previous anticoagulant or antiplatelet                            agents. ASA Grade Assessment: III - A patient with  severe systemic disease. After reviewing the risks                            and benefits, the patient was deemed in                            satisfactory condition to undergo the procedure.                           After obtaining informed consent, the endoscope was                            passed under direct vision. Throughout the                     procedure, the patient's blood pressure, pulse, and                            oxygen saturations were monitored continuously. The                            EG-[id number]) scope was introduced through the                            mouth, and advanced to the second part of duodenum.                            The upper GI endoscopy was accomplished without                            difficulty. The patient tolerated the procedure                            well. Scope In: Scope Out: Findings:      Three columns of non-bleeding small (< 5 mm) varices were found in the       distal esophagus,. They were flattened in largest diameter. No stigmata       of recent bleeding were evident and no red wale signs were present.       Scarring from prior treatment was visible. Estimated blood loss: none.      Moderate portal hypertensive gastropathy was found in the cardia, in the       gastric fundus and in the gastric body.      The cardia and gastric fundus were normal on retroflexion.      The exam was otherwise without abnormality. Impression:               - Non-bleeding small (< 5 mm) esophageal varices.                            flattened and scarred                           - Portal hypertensive gastropathy.                           -  The examination was otherwise normal.                           - No specimens collected. Moderate Sedation:      N/A- Per Anesthesia Care Recommendation:           - Patient has a contact number available for                            emergencies. The signs and symptoms of potential                            delayed complications were discussed with the                            patient. Return to normal activities tomorrow.                            Written discharge instructions were provided to the                            patient.                           - Resume previous diet.                           - Continue  present medications.                           - Repeat upper endoscopy in 1 year for surveillance.                           - Return to GI clinic in 5 months.                           - Have not restarted nadolol as he has hx headaches                            and falls - probably from old head injury but                            awaiting neurology evaluation Procedure Code(s):        --- Professional ---                           [phone], Esophagogastroduodenoscopy, flexible,                            transoral; diagnostic, including collection of                            specimen(s) by brushing or washing, when performed                            (separate procedure) Diagnosis Code(s):        ---  Professional ---                           I85.00, Esophageal varices without bleeding                           K76.6, Portal hypertension                           K31.89, Other diseases of stomach and duodenum CPT copyright 2016 American Medical Association. All rights reserved. The codes documented in this report are preliminary and upon coder review may  be revised to meet current compliance requirements. Gatha Mayer, MD 07/23/2017 8:45:39 AM This report has been signed electronically. Number of Addenda: 0

## 2017-07-23 NOTE — Anesthesia Preprocedure Evaluation (Addendum)
Anesthesia Evaluation  Patient identified by MRN, date of birth, ID band Patient awake    Reviewed: Allergy & Precautions, NPO status , Patient's Chart, lab work & pertinent test results  Airway Mallampati: II  TM Distance: >3 FB Neck ROM: Full    Dental no notable dental hx. (+) Partial Upper   Pulmonary Current Smoker,    Pulmonary exam normal breath sounds clear to auscultation       Cardiovascular negative cardio ROS Normal cardiovascular exam Rhythm:Regular Rate:Normal     Neuro/Psych negative neurological ROS  negative psych ROS   GI/Hepatic negative GI ROS, (+) Cirrhosis   Esophageal Varices    , Hepatitis -, C  Endo/Other  negative endocrine ROS  Renal/GU negative Renal ROS  negative genitourinary   Musculoskeletal negative musculoskeletal ROS (+)   Abdominal   Peds negative pediatric ROS (+)  Hematology negative hematology ROS (+)   Anesthesia Other Findings   Reproductive/Obstetrics negative OB ROS                             Anesthesia Physical Anesthesia Plan  ASA: III  Anesthesia Plan: MAC   Post-op Pain Management:    Induction: Intravenous  PONV Risk Score and Plan:   Airway Management Planned: Nasal Cannula  Additional Equipment:   Intra-op Plan:   Post-operative Plan:   Informed Consent: I have reviewed the patients History and Physical, chart, labs and discussed the procedure including the risks, benefits and alternatives for the proposed anesthesia with the patient or authorized representative who has indicated his/her understanding and acceptance.   Dental advisory given  Plan Discussed with: CRNA  Anesthesia Plan Comments:         Anesthesia Quick Evaluation

## 2017-07-23 NOTE — Discharge Instructions (Signed)
° °  Things look good - varices flat. No banding. Still have the portal gastropathy. I want to see you in December or January - sooner if needed.   rescope in 1 year routine.  I appreciate the opportunity to care for you. Gatha Mayer, MD, FACG  YOU HAD AN ENDOSCOPIC PROCEDURE TODAY: Refer to the procedure report and other information in the discharge instructions given to you for any specific questions about what was found during the examination. If this information does not answer your questions, please call Dr. Celesta Aver office at [phone] to clarify.   YOU SHOULD EXPECT: Some feelings of bloating in the abdomen. Passage of more gas than usual. Walking can help get rid of the air that was put into your GI tract during the procedure and reduce the bloating. If you had a lower endoscopy (such as a colonoscopy or flexible sigmoidoscopy) you may notice spotting of blood in your stool or on the toilet paper. Some abdominal soreness may be present for a day or two, also.  DIET: Your first meal following the procedure should be a light meal and then it is ok to progress to your normal diet. A half-sandwich or bowl of soup is an example of a good first meal. Heavy or fried foods are harder to digest and may make you feel nauseous or bloated. Drink plenty of fluids but you should avoid alcoholic beverages for 24 hours.   ACTIVITY: Your care partner should take you home directly after the procedure. You should plan to take it easy, moving slowly for the rest of the day. You can resume normal activity the day after the procedure however YOU SHOULD NOT DRIVE, use power tools, machinery or perform tasks that involve climbing or major physical exertion for 24 hours (because of the sedation medicines used during the test).   SYMPTOMS TO REPORT IMMEDIATELY: A gastroenterologist can be reached at any hour. Please call [phone]  for any of the following symptoms:  Following lower endoscopy  (colonoscopy, flexible sigmoidoscopy) Excessive amounts of blood in the stool  Significant tenderness, worsening of abdominal pains  Swelling of the abdomen that is new, acute  Fever of 100 or higher  Following upper endoscopy (EGD, EUS, ERCP, esophageal dilation) Vomiting of blood or coffee ground material  New, significant abdominal pain  New, significant chest pain or pain under the shoulder blades  Painful or persistently difficult swallowing  New shortness of breath  Black, tarry-looking or red, bloody stools  FOLLOW UP:  If any biopsies were taken you will be contacted by phone or by letter within the next 1-3 weeks. Call [phone]  if you have not heard about the biopsies in 3 weeks.  Please also call with any specific questions about appointments or follow up tests.

## 2017-07-23 NOTE — Interval H&P Note (Signed)
History and Physical Interval Note:  07/23/2017 7:43 AM  Francisco [name]  has presented today for surgery, with the diagnosis of esophageal varices  The various methods of treatment have been discussed with the patient and family. After consideration of risks, benefits and other options for treatment, the patient has consented to  Procedure(s): ESOPHAGOGASTRODUODENOSCOPY (EGD) WITH PROPOFOL (N/A) ESOPHAGEAL BANDING (N/A) as a surgical intervention .  The patient's history has been reviewed, patient examined, no change in status, stable for surgery.  I have reviewed the patient's chart and labs.  Questions were answered to the patient's satisfaction.     Silvano Rusk

## 2017-07-23 NOTE — Transfer of Care (Signed)
Immediate Anesthesia Transfer of Care Note  Patient: Francisco [name]  Procedure(s) Performed: Procedure(s): ESOPHAGOGASTRODUODENOSCOPY (EGD) WITH PROPOFOL (N/A) ESOPHAGEAL BANDING (N/A)  Patient Location: PACU  Anesthesia Type:MAC  Level of Consciousness: awake, alert  and oriented  Airway & Oxygen Therapy: Patient Spontanous Breathing and Patient connected to nasal cannula oxygen  Post-op Assessment: Report given to RN and Post -op Vital signs reviewed and stable  Post vital signs: Reviewed and stable  Last Vitals: There were no vitals filed for this visit.  Last Pain: There were no vitals filed for this visit.       Complications: No apparent anesthesia complications

## 2017-07-23 NOTE — Anesthesia Postprocedure Evaluation (Signed)
Anesthesia Post Note  Patient: Francisco [name]  Procedure(s) Performed: Procedure(s) (LRB): ESOPHAGOGASTRODUODENOSCOPY (EGD) WITH PROPOFOL (N/A)     Patient location during evaluation: Endoscopy Anesthesia Type: MAC Level of consciousness: awake and alert Pain management: pain level controlled Vital Signs Assessment: post-procedure vital signs reviewed and stable Respiratory status: spontaneous breathing, nonlabored ventilation, respiratory function stable and patient connected to nasal cannula oxygen Cardiovascular status: stable and blood pressure returned to baseline Anesthetic complications: no    Last Vitals:  Vitals:   07/23/17 0920 07/23/17 0947  BP: (!) 99/59 112/60  Pulse: (!) 59 62  Resp: 18 14  Temp:      Last Pain: There were no vitals filed for this visit.               Montez Hageman
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
## 2017-09-13 ENCOUNTER — Encounter: Payer: Self-pay | Admitting: Neurology

## 2017-09-13 ENCOUNTER — Ambulatory Visit (INDEPENDENT_AMBULATORY_CARE_PROVIDER_SITE_OTHER): Payer: Self-pay | Admitting: Neurology

## 2017-09-13 VITALS — BP 118/70 | HR 68 | Ht 72.0 in | Wt 225.8 lb

## 2017-09-13 DIAGNOSIS — R296 Repeated falls: Secondary | ICD-10-CM

## 2017-09-13 DIAGNOSIS — F17200 Nicotine dependence, unspecified, uncomplicated: Secondary | ICD-10-CM

## 2017-09-13 DIAGNOSIS — W19XXXA Unspecified fall, initial encounter: Secondary | ICD-10-CM
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
## 2017-09-13 NOTE — Patient Instructions (Signed)
I don't see anything on your exam that is concerning.  Quit smoking and continue otherwise healthy lifestyle.

## 2017-10-12 ENCOUNTER — Ambulatory Visit: Payer: Self-pay | Admitting: Neurology

## 2017-10-14 ENCOUNTER — Telehealth: Payer: Self-pay | Admitting: Neurology

## 2017-10-14 NOTE — Telephone Encounter (Signed)
Spoke with Pt, he decided the appt in November will be ok

## 2017-10-14 NOTE — Telephone Encounter (Signed)
Patient called to see about getting a follow up appt scheduled with Dr. Tomi Likens as well as to let him know that he had what he thought was an Epileptic Seizure last night. He is scheduled for November. Please Advise. Thanks

## 2017-10-18 ENCOUNTER — Other Ambulatory Visit: Payer: Self-pay | Admitting: Internal Medicine

## 2017-10-18 NOTE — Telephone Encounter (Signed)
Advise how many refills Sir, thank you.

## 2017-10-21 NOTE — Telephone Encounter (Signed)
Refill x 3 

## 2017-10-27 ENCOUNTER — Ambulatory Visit: Payer: Self-pay | Admitting: Neurology

## 2017-10-27 ENCOUNTER — Encounter: Payer: Self-pay | Admitting: Neurology

## 2017-10-27 VITALS — BP 110/76 | HR 60 | Ht 72.0 in | Wt 225.8 lb

## 2017-10-27 DIAGNOSIS — R251 Tremor, unspecified: Secondary | ICD-10-CM

## 2017-10-27 DIAGNOSIS — R55 Syncope and collapse: Secondary | ICD-10-CM

## 2017-10-27 NOTE — Patient Instructions (Signed)
1.  We will check a CT of the head 2.  We will check an EEG 3.  Follow up afterwards.
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
## 2017-10-28 ENCOUNTER — Ambulatory Visit (INDEPENDENT_AMBULATORY_CARE_PROVIDER_SITE_OTHER): Payer: Self-pay | Admitting: Neurology

## 2017-10-28 DIAGNOSIS — R251 Tremor, unspecified: Secondary | ICD-10-CM

## 2017-10-29 NOTE — Progress Notes (Signed)
ELECTROENCEPHALOGRAM REPORT  Date of Study: 10/28/2017  Patient's Name: Francisco [name] MRN: [id number] Date of Birth: 09-03-1956  Clinical History: 61 year old male with hepatic cirrhosis of liver with chronic hepatitis C and history of alcohol abuse who presents for episodes of falls with shaking and intact consciousness.  Medications: TYLENOL 500 MG tablet  65 FE MG tablet  LASIX 20 MG tablet  CHRONULAC 10 GM/15ML solution TIGER BALM ARTHRITIS RUB EX  MULTIVITAMIN tablet   ALDACTONE 50 MG tablet  Technical Summary: A multichannel digital EEG recording measured by the international 10-20 system with electrodes applied with paste and impedances below 5000 ohms performed in our laboratory with EKG monitoring in an awake and drowsy patient.  Hyperventilation was not performed.  Photic stimulation was performed.  The digital EEG was referentially recorded, reformatted, and digitally filtered in a variety of bipolar and referential montages for optimal display.    Description: The patient is awake and drowsy during the recording.  During maximal wakefulness, there is a symmetric, medium voltage 9 Hz posterior dominant rhythm that attenuates with eye opening.  The record is symmetric.  During drowsiness, there is an increase in theta slowing of the background.  Stage 2 sleep is not seen.  During photic stimulation, there are rhythmic centrally-predominant low to medium amplitude spike and wave discharges. On video, he exhibits jerking of his body with no loss of consciousness.   EKG lead was unremarkable.  Impression: This awake and drowsy EEG is abnormal due to photoparoxysmal response.    Clinical Correlation: The above finding is indicative of primary generalized epilepsy.   Metta Clines, DO

## 2017-11-01 ENCOUNTER — Ambulatory Visit (INDEPENDENT_AMBULATORY_CARE_PROVIDER_SITE_OTHER): Payer: Self-pay | Admitting: Neurology

## 2017-11-01 ENCOUNTER — Encounter: Payer: Self-pay | Admitting: Neurology

## 2017-11-01 VITALS — BP 110/74 | HR 63 | Ht 72.0 in | Wt 227.0 lb

## 2017-11-01 DIAGNOSIS — G40309 Generalized idiopathic epilepsy and epileptic syndromes, not intractable, without status epilepticus: Secondary | ICD-10-CM

## 2017-11-01 MED ORDER — LEVETIRACETAM 500 MG PO TABS: 500.0000 mg | ORAL_TABLET | Freq: Two times a day (BID) | ORAL | 3 refills | Status: DC

## 2017-11-01 NOTE — Patient Instructions (Signed)
1.  Start levetiracetam (Keppra) 500mg  twice daily 2.  Will get MRI of brain with and without contrast 3.  Follow up in 3 months.
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
## 2017-11-02 ENCOUNTER — Ambulatory Visit: Payer: Self-pay | Admitting: Neurology

## 2017-12-09 ENCOUNTER — Telehealth: Payer: Self-pay

## 2017-12-09 DIAGNOSIS — K7460 Unspecified cirrhosis of liver: Principal | ICD-10-CM

## 2017-12-09 DIAGNOSIS — B182 Chronic viral hepatitis C: Secondary | ICD-10-CM

## 2017-12-09 NOTE — Telephone Encounter (Signed)
-----   Message from Kody Brandl E [place], [place] sent at 07/12/2017 10:01 AM EDT ----- Set up limited RUQ U/S for patient in Jan. 2019 for his chronic hepatitis C with cirrhosis per Dr Carlean Purl.

## 2017-12-09 NOTE — Telephone Encounter (Signed)
Patient aware that his U/S appointment is 12/23/2017 at 11:30 AM, arrive at 11:15AM at [facility] radiology. I gave him the phone # if he needs to change the date/time.

## 2017-12-23 ENCOUNTER — Ambulatory Visit (HOSPITAL_COMMUNITY): Payer: Self-pay

## 2017-12-28 ENCOUNTER — Ambulatory Visit (HOSPITAL_COMMUNITY)
Admission: RE | Admit: 2017-12-28 | Discharge: 2017-12-28 | Disposition: A | Discharge status: Home / Self Care | Payer: Self-pay | Source: Ambulatory Visit | Attending: Internal Medicine | Admitting: Internal Medicine

## 2017-12-28 DIAGNOSIS — K7460 Unspecified cirrhosis of liver: Secondary | ICD-10-CM | POA: Insufficient documentation

## 2017-12-28 DIAGNOSIS — K8020 Calculus of gallbladder without cholecystitis without obstruction: Secondary | ICD-10-CM | POA: Insufficient documentation

## 2017-12-28 DIAGNOSIS — B182 Chronic viral hepatitis C: Secondary | ICD-10-CM | POA: Insufficient documentation

## 2017-12-29 NOTE — Progress Notes (Signed)
[name] ok No changes No tumors  Repeat in 6 mos

## 2018-01-24 ENCOUNTER — Other Ambulatory Visit: Payer: Self-pay

## 2018-02-02 ENCOUNTER — Encounter: Payer: Self-pay | Admitting: Neurology

## 2018-02-02 ENCOUNTER — Ambulatory Visit: Payer: Self-pay | Admitting: Neurology

## 2018-02-02 VITALS — BP 120/76 | HR 83 | Ht 72.0 in | Wt 223.6 lb

## 2018-02-02 DIAGNOSIS — F17200 Nicotine dependence, unspecified, uncomplicated: Secondary | ICD-10-CM

## 2018-02-02 DIAGNOSIS — G40309 Generalized idiopathic epilepsy and epileptic syndromes, not intractable, without status epilepticus: Secondary | ICD-10-CM

## 2018-02-02 MED ORDER — LEVETIRACETAM 500 MG PO TABS: 500.0000 mg | ORAL_TABLET | Freq: Two times a day (BID) | ORAL | 3 refills | Status: DC
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
## 2018-02-02 NOTE — Patient Instructions (Signed)
Refilled Keppra 500mg  twice daily We will reorder MRI of brain  Follow up in 5 months.

## 2018-02-03 NOTE — Addendum Note (Signed)
Addended by: Clois Comber on: 02/03/2018 03:58 PM   Modules accepted: Orders

## 2018-04-18 ENCOUNTER — Encounter: Payer: Self-pay | Admitting: Neurology

## 2018-06-29 ENCOUNTER — Telehealth: Payer: Self-pay

## 2018-06-29 DIAGNOSIS — B182 Chronic viral hepatitis C: Secondary | ICD-10-CM

## 2018-06-29 DIAGNOSIS — K7460 Unspecified cirrhosis of liver: Principal | ICD-10-CM

## 2018-06-29 NOTE — Telephone Encounter (Signed)
Patient has been scheduled for RUQ [name] on 07/05/18 10:30.  He is asked to arrive 15 minutes early and be NPO after midnight.

## 2018-06-29 NOTE — Telephone Encounter (Signed)
-----   Message from [name], RN sent at 12/30/2017  9:45 AM EST -----   ----- Message ----- From: [name], RN Sent: 12/30/2017   9:34 AM To: [name], RN  Needs [name] see results 12/30/17 - Carlean Purl

## 2018-07-05 ENCOUNTER — Ambulatory Visit (HOSPITAL_COMMUNITY)
Admission: RE | Admit: 2018-07-05 | Discharge: 2018-07-05 | Disposition: A | Discharge status: Home / Self Care | Payer: Self-pay | Source: Ambulatory Visit | Attending: Internal Medicine | Admitting: Internal Medicine

## 2018-07-05 DIAGNOSIS — K7460 Unspecified cirrhosis of liver: Secondary | ICD-10-CM | POA: Insufficient documentation

## 2018-07-05 DIAGNOSIS — K8020 Calculus of gallbladder without cholecystitis without obstruction: Secondary | ICD-10-CM | POA: Insufficient documentation

## 2018-07-05 DIAGNOSIS — B182 Chronic viral hepatitis C: Secondary | ICD-10-CM | POA: Insufficient documentation

## 2018-07-06 NOTE — Progress Notes (Signed)
Liver is stable Ask Ranveer to come see [name] when we have an opening suspect that will be October He needs repeat limited RUQ [name] in early March 2020

## 2018-07-07 ENCOUNTER — Ambulatory Visit (INDEPENDENT_AMBULATORY_CARE_PROVIDER_SITE_OTHER): Payer: Self-pay | Admitting: Neurology

## 2018-07-07 ENCOUNTER — Encounter: Payer: Self-pay | Admitting: Neurology

## 2018-07-07 VITALS — BP 100/70 | HR 55 | Ht 72.0 in | Wt 214.0 lb

## 2018-07-07 DIAGNOSIS — G40309 Generalized idiopathic epilepsy and epileptic syndromes, not intractable, without status epilepticus: Secondary | ICD-10-CM
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
## 2018-07-07 NOTE — Patient Instructions (Signed)
Continue Keppra 500mg  twice daily Look into where you may be able to get an MRI and contact me Follow up in 6 months.

## 2018-11-22 ENCOUNTER — Telehealth: Payer: Self-pay

## 2018-11-22 NOTE — Telephone Encounter (Signed)
-----   Message from Pieter Partridge, DO sent at 11/22/2018 10:24 AM EST ----- MRI of brain with and without contrast from 11/15/18 was normal.

## 2018-11-22 NOTE — Telephone Encounter (Signed)
Called and advised Pt of MRI results 
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
## 2019-01-09 ENCOUNTER — Encounter: Payer: Self-pay | Admitting: Neurology

## 2019-01-09 ENCOUNTER — Ambulatory Visit: Payer: Self-pay | Admitting: Neurology

## 2019-01-09 VITALS — BP 100/70 | HR 74 | Ht 72.0 in | Wt 222.2 lb

## 2019-01-09 DIAGNOSIS — G40309 Generalized idiopathic epilepsy and epileptic syndromes, not intractable, without status epilepticus: Secondary | ICD-10-CM

## 2019-01-09 NOTE — Patient Instructions (Signed)
1.  Continue Keppra 500mg  twice daily 2.  Follow up in one year

## 2019-02-16 ENCOUNTER — Other Ambulatory Visit: Payer: Self-pay | Admitting: Neurology

## 2019-02-24 ENCOUNTER — Telehealth: Payer: Self-pay | Admitting: Internal Medicine

## 2019-02-24 DIAGNOSIS — K7460 Unspecified cirrhosis of liver: Principal | ICD-10-CM

## 2019-02-24 DIAGNOSIS — B182 Chronic viral hepatitis C: Secondary | ICD-10-CM

## 2019-02-24 NOTE — Telephone Encounter (Signed)
Pt would like to schedule his 6 month f/u US. Pls call him.

## 2019-02-27 NOTE — Telephone Encounter (Signed)
Patient scheduled for RUQ [name] at [facility]  03/01/19 8:30.  Patient asked to arrive at 8:15 and be NPO after midnight

## 2019-03-01 ENCOUNTER — Other Ambulatory Visit: Payer: Self-pay

## 2019-03-01 ENCOUNTER — Ambulatory Visit (HOSPITAL_COMMUNITY)
Admission: RE | Admit: 2019-03-01 | Discharge: 2019-03-01 | Disposition: A | Discharge status: Home / Self Care | Payer: Self-pay | Source: Ambulatory Visit | Attending: Internal Medicine | Admitting: Internal Medicine

## 2019-03-01 DIAGNOSIS — K7460 Unspecified cirrhosis of liver: Secondary | ICD-10-CM | POA: Insufficient documentation

## 2019-03-01 DIAGNOSIS — B182 Chronic viral hepatitis C: Secondary | ICD-10-CM | POA: Insufficient documentation

## 2019-03-02 NOTE — Progress Notes (Signed)
[name] - stable cirrhosis no signs of liver cancer Repeat same in 6 months  I would like him to schedule a next avail f/u

## 2019-04-04 ENCOUNTER — Ambulatory Visit: Payer: Self-pay | Admitting: Internal Medicine

## 2019-07-26 ENCOUNTER — Encounter: Payer: Self-pay | Admitting: Neurology

## 2020-01-10 ENCOUNTER — Ambulatory Visit: Payer: Self-pay | Admitting: Neurology
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
## 2020-01-12 ENCOUNTER — Encounter: Payer: Self-pay | Admitting: Neurology

## 2020-01-15 ENCOUNTER — Other Ambulatory Visit: Payer: Self-pay

## 2020-01-15 ENCOUNTER — Telehealth (INDEPENDENT_AMBULATORY_CARE_PROVIDER_SITE_OTHER): Payer: Self-pay | Admitting: Neurology

## 2020-01-15 VITALS — Ht 72.0 in | Wt 210.0 lb

## 2020-01-15 DIAGNOSIS — G40309 Generalized idiopathic epilepsy and epileptic syndromes, not intractable, without status epilepticus: Secondary | ICD-10-CM

## 2020-01-15 MED ORDER — LEVETIRACETAM 500 MG PO TABS: 500.0000 mg | ORAL_TABLET | Freq: Two times a day (BID) | ORAL | 3 refills | Status: DC
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
## 2021-01-14 ENCOUNTER — Ambulatory Visit (INDEPENDENT_AMBULATORY_CARE_PROVIDER_SITE_OTHER): Payer: Self-pay | Admitting: Neurology

## 2021-01-14 ENCOUNTER — Encounter: Payer: Self-pay | Admitting: Neurology

## 2021-01-14 ENCOUNTER — Other Ambulatory Visit: Payer: Self-pay

## 2021-01-14 VITALS — BP 108/73 | HR 97 | Ht 72.0 in | Wt 214.8 lb

## 2021-01-14 DIAGNOSIS — G40309 Generalized idiopathic epilepsy and epileptic syndromes, not intractable, without status epilepticus: Secondary | ICD-10-CM

## 2021-01-14 NOTE — Patient Instructions (Signed)
1.  Continue Keppra as prescribed 2.  Follow up one year

## 2021-02-04 ENCOUNTER — Ambulatory Visit: Payer: Self-pay | Admitting: Orthopedic Surgery

## 2021-02-18 ENCOUNTER — Other Ambulatory Visit: Payer: Self-pay | Admitting: Neurology

## 2021-02-21 ENCOUNTER — Ambulatory Visit (INDEPENDENT_AMBULATORY_CARE_PROVIDER_SITE_OTHER): Payer: Medicare Other | Admitting: Orthopedic Surgery

## 2021-02-21 ENCOUNTER — Encounter: Payer: Self-pay | Admitting: Orthopedic Surgery

## 2021-02-21 ENCOUNTER — Other Ambulatory Visit: Payer: Self-pay

## 2021-02-21 VITALS — BP 120/78 | HR 74 | Temp 97.5°F | Resp 20 | Ht 72.0 in | Wt 211.8 lb

## 2021-02-21 DIAGNOSIS — M21612 Bunion of left foot: Secondary | ICD-10-CM

## 2021-02-21 DIAGNOSIS — K7460 Unspecified cirrhosis of liver: Secondary | ICD-10-CM

## 2021-02-21 DIAGNOSIS — M25662 Stiffness of left knee, not elsewhere classified: Secondary | ICD-10-CM

## 2021-02-21 DIAGNOSIS — D500 Iron deficiency anemia secondary to blood loss (chronic): Secondary | ICD-10-CM

## 2021-02-21 DIAGNOSIS — M25661 Stiffness of right knee, not elsewhere classified: Secondary | ICD-10-CM

## 2021-02-21 DIAGNOSIS — Z8619 Personal history of other infectious and parasitic diseases: Secondary | ICD-10-CM | POA: Diagnosis not present

## 2021-02-21 DIAGNOSIS — E890 Postprocedural hypothyroidism: Secondary | ICD-10-CM

## 2021-02-21 DIAGNOSIS — G40309 Generalized idiopathic epilepsy and epileptic syndromes, not intractable, without status epilepticus: Secondary | ICD-10-CM

## 2021-02-21 DIAGNOSIS — Z13220 Encounter for screening for lipoid disorders: Secondary | ICD-10-CM

## 2021-02-21 DIAGNOSIS — Z720 Tobacco use: Secondary | ICD-10-CM

## 2021-02-21 NOTE — Progress Notes (Addendum)
Careteam: Patient Care Team: Yvonna Alanis, NP as PCP - General (Adult Health Nurse Practitioner) Pieter Partridge, DO as Consulting Physician (Neurology)  Seen by: Windell Moulding, AGNP-C  PLACE OF SERVICE:  [place] Directive information Does Patient Have a Medical Advance Directive?: Yes, Type of Advance Directive: [place], Does patient want to make changes to medical advance directive?: No - Patient declined  Allergies  Allergen Reactions  . Naproxen Other (See Comments)    Esophageal bleeding  . Nsaids     Avoid due to liver condition     Chief Complaint  Patient presents with  . Establish Care    Establish Care     HPI: Patient is a 65 y.o. Francisco [name] seen today to establish at [facility]. Records requested from previous PCP.   Reports he has not had a true PCP since the 1980's. Followed by [facility] office last few years, but decided to switch. He lives in a single family home, takes care of his 53 year old father. 2 dogs. One daughter. 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 Takes keppra daily. No recent episodes. EEG done in the past.   Epilepsy- diagnosed in 2018. He started to become faint and had trouble with depth perception. Mother also had epilepsy. Followed Dr. Metta Clines at [facility]. Symptoms subsided with Keppra, takes daily. No recent episodes. Reports he has also had EEG in past.   Cirrhosis/ Hepatits C- diagnosed in 2016. He started having blood loss in stool. Followed by GI specialist, Dr. Silvano Rusk.  Did 12 week treatment of Harvoni, and hep c was never detected again. Has been told he has portal hypertension as well. Plans to see him soon.   Scoliosis- showed up on x-ray in the past. He fell on his tail bone and had bruising but no pain. Occasional back pain. No loss of bowel and bladder, radiating pain,  numbness or tingling.   Right rotator cuff- diagnosed in 1984. Shoulder would dislocate, but it  does not now. Full ROM. Denies shoulder pain.   Partial thyroidectomy- done in 2014. Denies fatigue, weight gain or constipation.   Knee stiffness- sometimes takes tylenol for mild pain. Stiffness is occasional.   Bunion on left big toe- denies pain, also has callus. Requesting to see podiatrist.   Genital herpes- diagnosed at age 72. No recent flares. Has a cream at home in case it returns.   Denies skin issues other than dandruff.   No falls or injuries.   1997- fractured radius- surgery done/ plat plates 1987- fractured right side of head and had plates placed at [facility]. 5 years ago reports screws came out between tooth and gum- Now reports 5 plates. Oral surgeon removed plate and screw came out on its own.    Colonoscopy- done in 2018, advised to f/u in 10 years.   Tetanus vaccination about 3 years ago- cut himself on rusty nail fixing fence. Given at [facility].   Has not had shingles vaccine.   Diet- meat and vegetable heavy. Eats what  Exercise- Walking 3 times week, does all chores.   Current smoker- about 20 daily, smoked about 47 years. Has not had CT scan of chest- would like one.   Stopped drinking alcohol in 2016. Heavy drinker in past.   Drug use- marijuana. No other dugs.   Eye exam- has not been done in years. Uses readers. Denies trouble with vision.  Dental exam- Partial upper denture. Gets cleanings 2 times a year.   Has a living will- copy provided.       Review of Systems:  Review of Systems  Constitutional: Negative for fever and malaise/fatigue.  HENT: Positive for congestion. Negative for hearing loss and sore throat.        Upper dentures  Eyes: Negative for blurred vision and double vision.       Readers  Respiratory: Positive for sputum production. Negative for cough, shortness of breath and wheezing.        Tobacco use  Cardiovascular: Negative for chest pain, palpitations and leg swelling.  Gastrointestinal: Negative for  abdominal pain, blood in stool, constipation, diarrhea, heartburn, nausea and vomiting.       History of hepatis C  Genitourinary: Negative for dysuria, frequency and hematuria.  Musculoskeletal: Positive for back pain and joint pain. Negative for falls.       Bilateral knee pain, left foot bunion  Skin:       dandruff  Neurological: Positive for seizures. Negative for dizziness, weakness and headaches.  Psychiatric/Behavioral: Negative for depression. The patient is not nervous/anxious and does not have insomnia.     Past Medical History:  Diagnosis Date  . Allergy to [place] pig dander   . Arthritis    "hands and knees the most; mild" (03/15/2017)  . Benign neoplasm of colon 07/19/2014   07/19/14 - 2 adenomas - repeat colonoscopy 2020   . Cirrhosis ([place])   . Diverticulosis 2008  . Epilepsy ([place])   . Esophageal varices with bleeding ([place]) 07/19/2014  . Falls related to headaches   . Fatty liver 2011   Abd [name]  . Headaches thought due to old head injury   . Hepatic cirrhosis due to chronic hepatitis C infection ([place]) and some component of alcohol use 07/19/2014  . Hepatitis C   . History of blood transfusion 1987   "related to the zygomatic arch fracture"  . Iron deficiency anemia secondary to blood loss (chronic) 07/19/2014  . Portal hypertensive gastropathy ([place]) 07/19/2014  . Scoliosis   . Torn rotator cuff    Past Surgical History:  Procedure Laterality Date  . ABDOMINAL SURGERY     Knife puncture to Stomach  . COLONOSCOPY  2008  . COLONOSCOPY N/A 07/19/2014   Procedure: COLONOSCOPY;  Surgeon: Gatha Mayer, MD;  Location: [facility];  Service: Endoscopy;  Laterality: N/A;  . ESOPHAGEAL BANDING  04/14/2017   Procedure: ESOPHAGEAL BANDING;  Surgeon: Gatha Mayer, MD;  Location: [facility] ENDOSCOPY;  Service: Endoscopy;;  . ESOPHAGOGASTRODUODENOSCOPY N/A 07/19/2014   Procedure: ESOPHAGOGASTRODUODENOSCOPY (EGD);  Surgeon: Gatha Mayer, MD;  Location: [facility] ENDOSCOPY;  Service:  Endoscopy;  Laterality: N/A;  . ESOPHAGOGASTRODUODENOSCOPY N/A 09/05/2014   Procedure: ESOPHAGOGASTRODUODENOSCOPY (EGD);  Surgeon: Gatha Mayer, MD;  Location: [facility];  Service: Endoscopy;  Laterality: N/A;  . ESOPHAGOGASTRODUODENOSCOPY N/A 10/17/2014   Procedure: ESOPHAGOGASTRODUODENOSCOPY (EGD);  Surgeon: Gatha Mayer, MD;  Location: [facility] ENDOS[facility];  Service: Endoscopy;  Laterality: N/A;  . ESOPHAGOGASTRODUODENOSCOPY N/A 12/19/2014   Procedure: ESOPHAGOGASTRODUODENOSCOPY (EGD);  Surgeon: Gatha Mayer, MD;  Location: [facility];  Service: Endoscopy;  Laterality: N/A;  . ESOPHAGOGASTRODUODENOSCOPY N/A 03/16/2017   Procedure: ESOPHAGOGASTRODUODENOSCOPY (EGD);  Surgeon: Ladene Artist, MD;  Location: [facility] ENDOSCOPY;  Service: Endoscopy;  Laterality: N/A;  . ESOPHAGOGASTRODUODENOSCOPY (EGD) WITH PROPOFOL N/A 11/21/2015   Procedure: ESOPHAGOGASTRODUODENOSCOPY (EGD) WITH PROPOFOL;  Surgeon: Gatha Mayer, MD;  Location: [facility];  Service: Endoscopy;  Laterality: N/A;  . ESOPHAGOGASTRODUODENOSCOPY (EGD) WITH PROPOFOL N/A 04/14/2017   Procedure: ESOPHAGOGASTRODUODENOSCOPY (EGD) WITH PROPOFOL;  Surgeon: Gatha Mayer, MD;  Location: [facility] ENDOSCOPY;  Service: Endoscopy;  Laterality: N/A;  . ESOPHAGOGASTRODUODENOSCOPY (EGD) WITH PROPOFOL N/A 07/23/2017   Procedure: ESOPHAGOGASTRODUODENOSCOPY (EGD) WITH PROPOFOL;  Surgeon: Gatha Mayer, MD;  Location: [facility] ENDOSCOPY;  Service: Endoscopy;  Laterality: N/A;  . FRACTURE SURGERY Right    cheek  . GASTRIC VARICES BANDING N/A 10/17/2014   Procedure: GASTRIC VARICES BANDING;  Surgeon: Gatha Mayer, MD;  Location: [facility] ENDOSCOPY;  Service: Endoscopy;  Laterality: N/A;  . ORIF ZYGOMATIC FRACTURE Right 1987  . RADIUS SYNOSTOSIS PROXIMAL RESECTION W/ ALLOGRAFT Right 1980s   at the elbow  . THYROIDECTOMY, PARTIAL Left 2012   Social History:   reports that he has been smoking cigarettes. He started smoking about Francisco years ago. He has a 21.50 pack-year  smoking history. He has never used smokeless tobacco. He reports that he does not drink alcohol and does not use drugs.  Family History  Problem Relation Age of Onset  . Breast cancer Mother 89  . Crohn's disease Father 15  . Arthritis/Rheumatoid Father   . Hearing loss Father   . Epilepsy Sister 75  . Ankylosing spondylitis Brother 53  . Heart disease Maternal Grandmother   . Alcohol abuse Maternal Grandfather   . Cancer Paternal Grandfather        type unknown, mets  . Alcohol abuse Maternal Uncle        several  . Depression Daughter 52    Medications: Patient's Medications  New Prescriptions   No medications on file  Previous Medications   ACETAMINOPHEN (TYLENOL) 500 MG TABLET    Take 500 mg by mouth as needed.   LEVETIRACETAM (KEPPRA) 500 MG TABLET    TAKE 1 TABLET BY MOUTH TWICE A DAY  Modified Medications   No medications on file  Discontinued Medications   No medications on file    Physical Exam:  Vitals:   02/21/21 1313  BP: 120/78  Pulse: 74  Resp: 20  Temp: (!) 97.5 F (36.4 C)  TempSrc: Temporal  SpO2: 95%  Weight: 211 lb 12.8 oz (96.1 kg)  Height: 6' (1.829 m)   Body mass index is 28.73 kg/m. Wt Readings from Last 3 Encounters:  02/21/21 211 lb 12.8 oz (96.1 kg)  01/14/21 214 lb 12.8 oz (97.4 kg)  01/12/20 210 lb (95.3 kg)    Physical Exam Vitals reviewed.  Constitutional:      General: He is not in acute distress. HENT:     Head: Normocephalic.  Cardiovascular:     Pulses: Normal pulses.          Dorsalis pedis pulses are 2+ on the left side.     Heart sounds: Normal heart sounds. No murmur heard.   Pulmonary:     Effort: Pulmonary effort is normal. No respiratory distress.     Breath sounds: Normal breath sounds. No wheezing.     Comments: Clubbing fingers Abdominal:     General: Bowel sounds are normal. There is no distension.     Palpations: Abdomen is soft.     Tenderness: There is no abdominal tenderness.  Musculoskeletal:      Right lower leg: No edema.     Left lower leg: No edema.     Left foot: Bunion present.  Feet:     Left foot:     Skin integrity: Callus present.  Toenail Condition: Left toenails are normal.  Skin:    General: Skin is warm and dry.     Capillary Refill: Capillary refill takes less than 2 seconds.  Neurological:     General: No focal deficit present.     Mental Status: He is alert and oriented to person, place, and time.     Motor: No weakness.     Gait: Gait normal.  Psychiatric:        Mood and Affect: Mood normal.        Behavior: Behavior normal.     Labs reviewed: Basic Metabolic Panel: No results for input(s): NA, K, CL, CO2, GLUCOSE, BUN, CREATININE, CALCIUM, MG, PHOS, TSH in the last 8760 hours. Liver Function Tests: No results for input(s): AST, ALT, ALKPHOS, BILITOT, PROT, ALBUMIN in the last 8760 hours. No results for input(s): LIPASE, AMYLASE in the last 8760 hours. No results for input(s): AMMONIA in the last 8760 hours. CBC: No results for input(s): WBC, NEUTROABS, HGB, HCT, MCV, PLT in the last 8760 hours. Lipid Panel: No results for input(s): CHOL, HDL, LDLCALC, TRIG, CHOLHDL, LDLDIRECT in the last 8760 hours. TSH: No results for input(s): TSH in the last 8760 hours. A1C: No results found for: HGBA1C   Assessment/Plan 1. Bunion of left foot - Ambulatory referral to Podiatry  2. Nonintractable generalized idiopathic epilepsy without status epilepticus ([place]) - stable with keppra, no recent episodes - Basic Metabolic Panel; Future  3. Cirrhosis of liver without ascites, unspecified hepatic cirrhosis type ([place]) - followed by GI - cont alcohol abstinence - hepatic panel- future  4. History of hepatitis C - treated with Harvoni in past - Hepatic Function Panel; Future  5. H/O partial thyroidectomy - denies fatigue or weight gain - TSH; Future  6. Tobacco use - smokes about 20 cigarettes/day - will discuss low density CT scan to r/o luncg  cancer nuxt visit  7. Knee joint stiffness, bilateral - stable, does not occur often - cont prn tylenol  8. History of herpes genitalis - stable, no recent flares  9. Screening for lipid disorders -follows high protein diet - Lipid Panel; Future  10. Iron deficiency anemia due to chronic blood loss - denies hematuria or blood in stool - CBC with Differential/Platelets; Future  I provided 55 minutes of face-to-face time during this encounter.   Next appt: 03/28/2021 Windell Moulding, [facility] [phone]

## 2021-02-21 NOTE — Patient Instructions (Addendum)
Ketoconazole shampoo- Nizoral- helps with dandruff  Health Maintenance, Male Adopting a healthy lifestyle and getting preventive care are important in promoting health and wellness. Ask your health care provider about:  The right schedule for you to have regular tests and exams.  Things you can do on your own to prevent diseases and keep yourself healthy. What should I know about diet, weight, and exercise? Eat a healthy diet  Eat a diet that includes plenty of vegetables, fruits, low-fat dairy products, and lean protein.  Do not eat a lot of foods that are high in solid fats, added sugars, or sodium.   Maintain a healthy weight Body mass index (BMI) is a measurement that can be used to identify possible weight problems. It estimates body fat based on height and weight. Your health care provider can help determine your BMI and help you achieve or maintain a healthy weight. Get regular exercise Get regular exercise. This is one of the most important things you can do for your health. Most adults should:  Exercise for at least 150 minutes each week. The exercise should increase your heart rate and make you sweat (moderate-intensity exercise).  Do strengthening exercises at least twice a week. This is in addition to the moderate-intensity exercise.  Spend less time sitting. Even light physical activity can be beneficial. Watch cholesterol and blood lipids Have your blood tested for lipids and cholesterol at 65 years of age, then have this test every 5 years. You may need to have your cholesterol levels checked more often if:  Your lipid or cholesterol levels are high.  You are older than 65 years of age.  You are at high risk for heart disease. What should I know about cancer screening? Many types of cancers can be detected early and may often be prevented. Depending on your health history and family history, you may need to have cancer screening at various ages. This may include  screening for:  Colorectal cancer.  Prostate cancer.  Skin cancer.  Lung cancer. What should I know about heart disease, diabetes, and high blood pressure? Blood pressure and heart disease  High blood pressure causes heart disease and increases the risk of stroke. This is more likely to develop in people who have high blood pressure readings, are of African descent, or are overweight.  Talk with your health care provider about your target blood pressure readings.  Have your blood pressure checked: ? Every 3-5 years if you are 31-30 years of age. ? Every year if you are 57 years old or older.  If you are between the ages of 86 and 27 and are a current or former smoker, ask your health care provider if you should have a one-time screening for abdominal aortic aneurysm (AAA). Diabetes Have regular diabetes screenings. This checks your fasting blood sugar level. Have the screening done:  Once every three years after age 70 if you are at a normal weight and have a low risk for diabetes.  More often and at a younger age if you are overweight or have a high risk for diabetes. What should I know about preventing infection? Hepatitis B If you have a higher risk for hepatitis B, you should be screened for this virus. Talk with your health care provider to find out if you are at risk for hepatitis B infection. Hepatitis C Blood testing is recommended for:  Everyone born from [birth year] through 1965.  Anyone with known risk factors for hepatitis C. Sexually transmitted infections (  STIs)  You should be screened each year for STIs, including gonorrhea and chlamydia, if: ? You are sexually active and are younger than 65 years of age. ? You are older than 65 years of age and your health care provider tells you that you are at risk for this type of infection. ? Your sexual activity has changed since you were last screened, and you are at increased risk for chlamydia or gonorrhea. Ask your health  care provider if you are at risk.  Ask your health care provider about whether you are at high risk for HIV. Your health care provider may recommend a prescription medicine to help prevent HIV infection. If you choose to take medicine to prevent HIV, you should first get tested for HIV. You should then be tested every 3 months for as long as you are taking the medicine. Follow these instructions at home: Lifestyle  Do not use any products that contain nicotine or tobacco, such as cigarettes, e-cigarettes, and chewing tobacco. If you need help quitting, ask your health care provider.  Do not use street drugs.  Do not share needles.  Ask your health care provider for help if you need support or information about quitting drugs. Alcohol use  Do not drink alcohol if your health care provider tells you not to drink.  If you drink alcohol: ? Limit how much you have to 0-2 drinks a day. ? Be aware of how much alcohol is in your drink. In the [place] one drink equals one 12 oz bottle of beer (355 mL), one 5 oz glass of wine (148 mL), or one 1 oz glass of hard liquor (44 mL). General instructions  Schedule regular health, dental, and eye exams.  Stay current with your vaccines.  Tell your health care provider if: ? You often feel depressed. ? You have ever been abused or do not feel safe at home. Summary  Adopting a healthy lifestyle and getting preventive care are important in promoting health and wellness.  Follow your health care provider's instructions about healthy diet, exercising, and getting tested or screened for diseases.  Follow your health care provider's instructions on monitoring your cholesterol and blood pressure. This information is not intended to replace advice given to you by your health care provider. Make sure you discuss any questions you have with your health care provider. Document Revised: 11/30/2018 Document Reviewed: 11/30/2018 Elsevier Patient Education  2021  [employer].

## 2021-03-25 ENCOUNTER — Ambulatory Visit (INDEPENDENT_AMBULATORY_CARE_PROVIDER_SITE_OTHER): Payer: Medicare Other | Admitting: Podiatry

## 2021-03-25 ENCOUNTER — Ambulatory Visit (INDEPENDENT_AMBULATORY_CARE_PROVIDER_SITE_OTHER): Payer: Medicare Other

## 2021-03-25 ENCOUNTER — Encounter: Payer: Self-pay | Admitting: Podiatry

## 2021-03-25 ENCOUNTER — Other Ambulatory Visit: Payer: Medicare Other

## 2021-03-25 ENCOUNTER — Other Ambulatory Visit: Payer: Self-pay

## 2021-03-25 DIAGNOSIS — M2012 Hallux valgus (acquired), left foot: Secondary | ICD-10-CM

## 2021-03-25 DIAGNOSIS — E890 Postprocedural hypothyroidism: Secondary | ICD-10-CM

## 2021-03-25 DIAGNOSIS — D500 Iron deficiency anemia secondary to blood loss (chronic): Secondary | ICD-10-CM

## 2021-03-25 DIAGNOSIS — G40309 Generalized idiopathic epilepsy and epileptic syndromes, not intractable, without status epilepticus: Secondary | ICD-10-CM

## 2021-03-25 DIAGNOSIS — D2372 Other benign neoplasm of skin of left lower limb, including hip: Secondary | ICD-10-CM | POA: Diagnosis not present

## 2021-03-25 DIAGNOSIS — Z8619 Personal history of other infectious and parasitic diseases: Secondary | ICD-10-CM

## 2021-03-25 DIAGNOSIS — Z13220 Encounter for screening for lipoid disorders: Secondary | ICD-10-CM
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
## 2021-03-26 LAB — HEPATIC FUNCTION PANEL
AG Ratio: 1.1 (calc) (ref 1.0–2.5)
ALT: 23 U/L (ref 9–46)
AST: 28 U/L (ref 10–35)
Albumin: 4 g/dL (ref 3.6–5.1)
Alkaline phosphatase (APISO): 100 U/L (ref 35–144)
Bilirubin, Direct: 0.2 mg/dL (ref 0.0–0.2)
Globulin: 3.5 g/dL (calc) (ref 1.9–3.7)
Indirect Bilirubin: 0.6 mg/dL (calc) (ref 0.2–1.2)
Total Bilirubin: 0.8 mg/dL (ref 0.2–1.2)
Total Protein: 7.5 g/dL (ref 6.1–8.1)

## 2021-03-26 LAB — TSH: TSH: 3.23 mIU/L (ref 0.40–4.50)

## 2021-03-26 LAB — CBC WITH DIFFERENTIAL/PLATELET
Absolute Monocytes: 252 cells/uL (ref 200–950)
Basophils Absolute: 30 cells/uL (ref 0–200)
Basophils Relative: 0.8 %
Eosinophils Absolute: 141 cells/uL (ref 15–500)
Eosinophils Relative: 3.8 %
HCT: 40.5 % (ref 38.5–50.0)
Hemoglobin: 14.2 g/dL (ref 13.2–17.1)
Lymphs Abs: 895 cells/uL (ref 850–3900)
MCH: 31.1 pg (ref 27.0–33.0)
MCHC: 35.1 g/dL (ref 32.0–36.0)
MCV: 88.6 fL (ref 80.0–100.0)
MPV: 9.5 fL (ref 7.5–12.5)
Monocytes Relative: 6.8 %
Neutro Abs: 2383 cells/uL (ref 1500–7800)
Neutrophils Relative %: 64.4 %
Platelets: 55 10*3/uL — ABNORMAL LOW (ref 140–400)
RBC: 4.57 10*6/uL (ref 4.20–5.80)
RDW: 13.2 % (ref 11.0–15.0)
Total Lymphocyte: 24.2 %
WBC: 3.7 10*3/uL — ABNORMAL LOW (ref 3.8–10.8)

## 2021-03-26 LAB — LIPID PANEL
Cholesterol: 154 mg/dL (ref <200)
HDL: 49 mg/dL (ref >=40)
LDL Cholesterol (Calc): 91 mg/dL (calc)
Non-HDL Cholesterol (Calc): 105 mg/dL (calc) (ref <130)
Total CHOL/HDL Ratio: 3.1 (calc) (ref <5.0)
Triglycerides: 55 mg/dL (ref <150)

## 2021-03-26 LAB — BASIC METABOLIC PANEL
BUN/Creatinine Ratio: 22 (calc) (ref 6–22)
BUN: 13 mg/dL (ref 7–25)
CO2: 22 mmol/L (ref 20–32)
Calcium: 9.7 mg/dL (ref 8.6–10.3)
Chloride: 108 mmol/L (ref 98–110)
Creat: 0.59 mg/dL — ABNORMAL LOW (ref 0.70–1.25)
Glucose, Bld: 93 mg/dL (ref 65–99)
Potassium: 3.8 mmol/L (ref 3.5–5.3)
Sodium: 138 mmol/L (ref 135–146)

## 2021-03-27 ENCOUNTER — Encounter: Payer: Self-pay | Admitting: Orthopedic Surgery

## 2021-03-27 ENCOUNTER — Other Ambulatory Visit: Payer: Self-pay

## 2021-03-27 ENCOUNTER — Ambulatory Visit (INDEPENDENT_AMBULATORY_CARE_PROVIDER_SITE_OTHER): Payer: Medicare Other | Admitting: Orthopedic Surgery

## 2021-03-27 VITALS — BP 118/78 | HR 66 | Temp 97.9°F | Resp 20 | Ht 72.0 in | Wt 213.0 lb

## 2021-03-27 DIAGNOSIS — D696 Thrombocytopenia, unspecified: Secondary | ICD-10-CM | POA: Diagnosis not present

## 2021-03-27 DIAGNOSIS — M21612 Bunion of left foot: Secondary | ICD-10-CM

## 2021-03-27 DIAGNOSIS — Z0000 Encounter for general adult medical examination without abnormal findings: Secondary | ICD-10-CM | POA: Diagnosis not present

## 2021-03-27 DIAGNOSIS — Z122 Encounter for screening for malignant neoplasm of respiratory organs: Secondary | ICD-10-CM

## 2021-03-27 DIAGNOSIS — R053 Chronic cough: Secondary | ICD-10-CM

## 2021-03-27 DIAGNOSIS — Z720 Tobacco use: Secondary | ICD-10-CM

## 2021-03-27 DIAGNOSIS — F338 Other recurrent depressive disorders: Secondary | ICD-10-CM

## 2021-03-27 MED ORDER — ALBUTEROL SULFATE HFA 108 (90 BASE) MCG/ACT IN AERS: 2.0000 | INHALATION_SPRAY | Freq: Four times a day (QID) | RESPIRATORY_TRACT | 0 refills | Status: DC | PRN

## 2021-03-27 NOTE — Patient Instructions (Signed)
Thrombocytopenia Thrombocytopenia means that you have a low number of platelets in your blood. Platelets are tiny cells in the blood. When you bleed, they clump together at the cut or injury to stop the bleeding. This is called blood clotting. If you do not have enough platelets, it can cause bleeding problems. Some cases of this condition are mild while others are more severe. What are the causes? This condition may be caused by:  Your body not making enough platelets. This may be caused by: ? Your bone marrow not making blood cells (aplastic anemia). ? Cancer in the bone marrow. ? Certain medicines. ? Infection in the bone marrow. ? Drinking a lot of alcohol.  Your body destroying platelets too quickly. This may be caused by: ? Certain immune diseases. ? Certain medicines. ? Certain blood clotting disorders. ? Certain disorders that are passed from parent to child (inherited). ? Certain bleeding disorders. ? Pregnancy. ? Having a spleen that is larger than normal. What are the signs or symptoms?  Bleeding that is not normal.  Nosebleeds.  Heavy menstrual periods.  Blood in the pee (urine) or poop (stool).  A purple-like color to the skin (purpura).  Bruising.  A rash that looks like pinpoint, purple-red spots (petechiae). How is this treated?  Treatment of another condition that is causing the low platelet count.  Medicines to help protect your platelets from being destroyed.  A replacement (transfusion) of platelets to stop or prevent bleeding.  Surgery to remove the spleen. Follow these instructions at home: Activity  Avoid activities that could cause you to get hurt or bruised. Follow instructions about how to prevent falls.  Take care not to cut yourself: ? When you shave. ? When you use scissors, needles, knives, or other tools.  Take care not to burn yourself: ? When you use an iron. ? When you cook. General instructions  Check your skin and the  inside of your mouth for bruises or blood as told by your doctor.  Check to see if there is blood in your spit (sputum), pee, and poop. Do this as told by your doctor.  Do not drink alcohol.  Take over-the-counter and prescription medicines only as told by your doctor.  Do not take any medicines that have aspirin or NSAIDs in them. These medicines can thin your blood and cause you to bleed.  Tell all of your doctors that you have this condition. Be sure to tell your dentist and eye doctor too.   Contact a doctor if:  You have bruises and you do not know why. Get help right away if:  You are bleeding anywhere on your body.  You have blood in your spit, pee, or poop. Summary  Thrombocytopenia means that you have a low number of platelets in your blood.  Platelets are needed for blood clotting.  Symptoms of this condition include bleeding that is not normal, and bruising.  Take care not to cut or burn yourself. This information is not intended to replace advice given to you by your health care provider. Make sure you discuss any questions you have with your health care provider. Document Revised: 09/08/2018 Document Reviewed: 09/08/2018 Elsevier Patient Education  2021 [employer]  
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
## 2021-03-28 ENCOUNTER — Telehealth: Payer: Self-pay | Admitting: Hematology

## 2021-03-28 ENCOUNTER — Ambulatory Visit: Payer: Medicare Other | Admitting: Orthopedic Surgery

## 2021-03-28 ENCOUNTER — Other Ambulatory Visit: Payer: Self-pay | Admitting: Orthopedic Surgery

## 2021-03-28 DIAGNOSIS — D696 Thrombocytopenia, unspecified: Secondary | ICD-10-CM

## 2021-03-28 LAB — CBC WITH DIFFERENTIAL/PLATELET
Absolute Monocytes: 269 cells/uL (ref 200–950)
Basophils Absolute: 19 cells/uL (ref 0–200)
Basophils Relative: 0.6 %
Eosinophils Absolute: 115 cells/uL (ref 15–500)
Eosinophils Relative: 3.6 %
HCT: 42.4 % (ref 38.5–50.0)
Hemoglobin: 14.6 g/dL (ref 13.2–17.1)
Lymphs Abs: 1005 cells/uL (ref 850–3900)
MCH: 31.1 pg (ref 27.0–33.0)
MCHC: 34.4 g/dL (ref 32.0–36.0)
MCV: 90.4 fL (ref 80.0–100.0)
MPV: 9.8 fL (ref 7.5–12.5)
Monocytes Relative: 8.4 %
Neutro Abs: 1792 cells/uL (ref 1500–7800)
Neutrophils Relative %: 56 %
Platelets: 55 10*3/uL — ABNORMAL LOW (ref 140–400)
RBC: 4.69 10*6/uL (ref 4.20–5.80)
RDW: 13.5 % (ref 11.0–15.0)
Total Lymphocyte: 31.4 %
WBC: 3.2 10*3/uL — ABNORMAL LOW (ref 3.8–10.8)

## 2021-03-28 NOTE — Telephone Encounter (Signed)
Received a new hem referral from [place] for thrombocytopenia. Francisco [name] has been cld and scheduled to see Dr. Irene Limbo on 4/12 at 1pm. Pt aware to arrive 20 minutes early.
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
## 2021-04-01 ENCOUNTER — Inpatient Hospital Stay: Payer: Medicare Other

## 2021-04-01 ENCOUNTER — Inpatient Hospital Stay: Payer: Medicare Other | Attending: Hematology | Admitting: Hematology

## 2021-04-01 ENCOUNTER — Other Ambulatory Visit: Payer: Self-pay

## 2021-04-01 ENCOUNTER — Telehealth: Payer: Self-pay | Admitting: Hematology

## 2021-04-01 VITALS — BP 110/69 | HR 58 | Temp 98.2°F | Resp 19 | Ht 72.0 in | Wt 215.0 lb

## 2021-04-01 DIAGNOSIS — K7460 Unspecified cirrhosis of liver: Secondary | ICD-10-CM | POA: Insufficient documentation

## 2021-04-01 DIAGNOSIS — M791 Myalgia, unspecified site: Secondary | ICD-10-CM | POA: Insufficient documentation

## 2021-04-01 DIAGNOSIS — D696 Thrombocytopenia, unspecified: Secondary | ICD-10-CM

## 2021-04-01 DIAGNOSIS — Z8249 Family history of ischemic heart disease and other diseases of the circulatory system: Secondary | ICD-10-CM | POA: Diagnosis not present

## 2021-04-01 DIAGNOSIS — Z818 Family history of other mental and behavioral disorders: Secondary | ICD-10-CM | POA: Insufficient documentation

## 2021-04-01 DIAGNOSIS — F17210 Nicotine dependence, cigarettes, uncomplicated: Secondary | ICD-10-CM | POA: Diagnosis not present

## 2021-04-01 DIAGNOSIS — Z886 Allergy status to analgesic agent status: Secondary | ICD-10-CM | POA: Insufficient documentation

## 2021-04-01 DIAGNOSIS — Z809 Family history of malignant neoplasm, unspecified: Secondary | ICD-10-CM | POA: Diagnosis not present

## 2021-04-01 DIAGNOSIS — D731 Hypersplenism: Secondary | ICD-10-CM | POA: Diagnosis not present

## 2021-04-01 DIAGNOSIS — D6959 Other secondary thrombocytopenia: Secondary | ICD-10-CM | POA: Insufficient documentation

## 2021-04-01 DIAGNOSIS — Z811 Family history of alcohol abuse and dependence: Secondary | ICD-10-CM | POA: Insufficient documentation

## 2021-04-01 DIAGNOSIS — Z8379 Family history of other diseases of the digestive system: Secondary | ICD-10-CM | POA: Insufficient documentation

## 2021-04-01 DIAGNOSIS — Z8261 Family history of arthritis: Secondary | ICD-10-CM | POA: Diagnosis not present

## 2021-04-01 DIAGNOSIS — Z820 Family history of epilepsy and other diseases of the nervous system: Secondary | ICD-10-CM | POA: Diagnosis not present

## 2021-04-01 DIAGNOSIS — Z86010 Personal history of colonic polyps: Secondary | ICD-10-CM | POA: Insufficient documentation

## 2021-04-01 DIAGNOSIS — Z803 Family history of malignant neoplasm of breast: Secondary | ICD-10-CM | POA: Diagnosis not present

## 2021-04-01 DIAGNOSIS — Z822 Family history of deafness and hearing loss: Secondary | ICD-10-CM | POA: Insufficient documentation

## 2021-04-01 DIAGNOSIS — B182 Chronic viral hepatitis C: Secondary | ICD-10-CM | POA: Insufficient documentation

## 2021-04-01 DIAGNOSIS — Z79899 Other long term (current) drug therapy: Secondary | ICD-10-CM | POA: Diagnosis not present

## 2021-04-01 LAB — FIBRINOGEN: Fibrinogen: 357 mg/dL (ref 210–475)

## 2021-04-01 LAB — CBC WITH DIFFERENTIAL/PLATELET
Abs Immature Granulocytes: 0.01 10*3/uL (ref 0.00–0.07)
Basophils Absolute: 0 10*3/uL (ref 0.0–0.1)
Basophils Relative: 0 %
Eosinophils Absolute: 0.1 10*3/uL (ref 0.0–0.5)
Eosinophils Relative: 3 %
HCT: 41.5 % (ref 39.0–52.0)
Hemoglobin: 14.5 g/dL (ref 13.0–17.0)
Immature Granulocytes: 0 %
Lymphocytes Relative: 23 %
Lymphs Abs: 0.9 10*3/uL (ref 0.7–4.0)
MCH: 30.9 pg (ref 26.0–34.0)
MCHC: 34.9 g/dL (ref 30.0–36.0)
MCV: 88.5 fL (ref 80.0–100.0)
Monocytes Absolute: 0.3 10*3/uL (ref 0.1–1.0)
Monocytes Relative: 8 %
Neutro Abs: 2.5 10*3/uL (ref 1.7–7.7)
Neutrophils Relative %: 66 %
Platelets: 62 10*3/uL — ABNORMAL LOW (ref 150–400)
RBC: 4.69 MIL/uL (ref 4.22–5.81)
RDW: 13.8 % (ref 11.5–15.5)
WBC: 3.8 10*3/uL — ABNORMAL LOW (ref 4.0–10.5)
nRBC: 0 % (ref 0.0–0.2)

## 2021-04-01 LAB — PROTIME-INR
INR: 1.2 (ref 0.8–1.2)
Prothrombin Time: 14.8 seconds (ref 11.4–15.2)

## 2021-04-01 LAB — PLATELET BY CITRATE

## 2021-04-01 LAB — VITAMIN B12: Vitamin B-12: 221 pg/mL (ref 180–914)

## 2021-04-01 LAB — FERRITIN: Ferritin: 129 ng/mL (ref 24–336)

## 2021-04-01 NOTE — Telephone Encounter (Signed)
Scheduled follow-up appointment per 4/12 los. Patient is aware. 

## 2021-04-02 LAB — FOLATE RBC
Folate, Hemolysate: 362 ng/mL
Folate, RBC: 850 ng/mL (ref >498)
Hematocrit: 42.6 % (ref 37.5–51.0)

## 2021-04-05 ENCOUNTER — Other Ambulatory Visit: Payer: Self-pay

## 2021-04-05 ENCOUNTER — Ambulatory Visit
Admission: RE | Admit: 2021-04-05 | Discharge: 2021-04-05 | Disposition: A | Discharge status: Home / Self Care | Payer: Medicare Other | Source: Ambulatory Visit | Attending: Orthopedic Surgery | Admitting: Orthopedic Surgery

## 2021-04-05 DIAGNOSIS — Z122 Encounter for screening for malignant neoplasm of respiratory organs: Secondary | ICD-10-CM

## 2021-04-09 ENCOUNTER — Telehealth: Payer: Self-pay | Admitting: Orthopedic Surgery

## 2021-04-09 DIAGNOSIS — R053 Chronic cough: Secondary | ICD-10-CM

## 2021-04-09 DIAGNOSIS — Z122 Encounter for screening for malignant neoplasm of respiratory organs: Secondary | ICD-10-CM

## 2021-04-09 DIAGNOSIS — Z720 Tobacco use: Secondary | ICD-10-CM

## 2021-04-09 NOTE — Telephone Encounter (Signed)
CT results discussed with patient. He would like to proceed with 3 month follow up CT. Orders placed.

## 2021-05-14 ENCOUNTER — Other Ambulatory Visit (INDEPENDENT_AMBULATORY_CARE_PROVIDER_SITE_OTHER): Payer: Medicare Other

## 2021-05-14 ENCOUNTER — Encounter: Payer: Self-pay | Admitting: Internal Medicine

## 2021-05-14 ENCOUNTER — Ambulatory Visit: Payer: Medicare Other | Admitting: Internal Medicine

## 2021-05-14 VITALS — BP 94/54 | HR 68 | Ht 70.5 in | Wt 213.1 lb

## 2021-05-14 DIAGNOSIS — K766 Portal hypertension: Secondary | ICD-10-CM | POA: Diagnosis not present

## 2021-05-14 DIAGNOSIS — I8511 Secondary esophageal varices with bleeding: Secondary | ICD-10-CM | POA: Diagnosis not present

## 2021-05-14 DIAGNOSIS — Z1211 Encounter for screening for malignant neoplasm of colon: Secondary | ICD-10-CM

## 2021-05-14 DIAGNOSIS — B182 Chronic viral hepatitis C: Secondary | ICD-10-CM | POA: Diagnosis not present

## 2021-05-14 DIAGNOSIS — K7460 Unspecified cirrhosis of liver: Secondary | ICD-10-CM

## 2021-05-14 DIAGNOSIS — K429 Umbilical hernia without obstruction or gangrene: Secondary | ICD-10-CM

## 2021-05-14 DIAGNOSIS — K3189 Other diseases of stomach and duodenum: Secondary | ICD-10-CM

## 2021-05-14 DIAGNOSIS — Z86010 Personal history of colonic polyps: Secondary | ICD-10-CM

## 2021-05-14 LAB — AMMONIA: Ammonia: 38 umol/L — ABNORMAL HIGH (ref 11–35)

## 2021-05-14 NOTE — Assessment & Plan Note (Signed)
Current meld score is 8 which is good Current child Pugh is 5 points class A.  Also good. Ammonia today level slightly high at 38 I am not sure what to make of that clinically he is not encephalopathic.  He needs more cross-sectional imaging so he will do an ultrasound.  Complete abdominal.  Alpha-fetoprotein is checked as well.  Further plans pending these tests and the EGD.

## 2021-05-14 NOTE — Assessment & Plan Note (Signed)
Await ultrasound re: any ascites.  Consider surgical referral.  I think medically he is a good candidate overall it does sound like this may have been symptomatic during his recent seizure.  Or at least after it.  I think on the one hand it could be good to repair this now before he potentially has any decompensation of his liver disease but watchful waiting may be appropriate as well.  I anticipate most likely sending him for a surgical consult to work this out but we have other work to do first. Handout provided to the patient today.  Explained that if he has persistent pain induration in that area he needs to get prompt evaluation.

## 2021-05-14 NOTE — Patient Instructions (Addendum)
Your provider has requested that you go to the basement level for lab work before leaving today. Press "B" on the elevator. The lab is located at the first door on the left as you exit the elevator.  Due to recent changes in healthcare laws, you may see the results of your imaging and laboratory studies on MyChart before your provider has had a chance to review them.  We understand that in some cases there may be results that are confusing or concerning to you. Not all laboratory results come back in the same time frame and the provider may be waiting for multiple results in order to interpret others.  Please give [name] 48 hours in order for your provider to thoroughly review all the results before contacting the office for clarification of your results.   You will be contacted by [facility] in the next 2 days to arrange a complete abdominal ultrasound.  The number on your caller ID will be [phone], please answer when they call.  If you have not heard from them in 2 days please call [phone] to schedule.    You have been scheduled for an endoscopy and colonoscopy. Please follow the written instructions given to you at your visit today. Please pick up your prep supplies at the pharmacy within the next 1-3 days. If you use inhalers (even only as needed), please bring them with you on the day of your procedure.  We have given you information to read on umbilical hernia.  I appreciate the opportunity to care for you. Silvano Rusk, MD, [facility]

## 2021-05-14 NOTE — Assessment & Plan Note (Signed)
Plan for repeat colonoscopy at the same time of his EGD.  June 30.The risks and benefits as well as alternatives of endoscopic procedure(s) have been discussed and reviewed. All questions answered. The patient agrees to proceed.

## 2021-05-14 NOTE — Progress Notes (Signed)
Francisco [name] 65 y.o. 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  Also good. Ammonia today level slightly high at 38 I am not sure what to make of that clinically he is not encephalopathic.  He needs more cross-sectional imaging so he will do an ultrasound.  Complete abdominal.  Alpha-fetoprotein is checked as well.  Further plans pending these tests and the EGD.  Portal hypertensive gastropathy Reassessment EGD  Bleeding esophageal varices (HCC) Reassess possibly bad with EGD.  He has not tolerated beta-blockers in the past.  Umbilical hernia without obstruction and without gangrene Await ultrasound re: any ascites.  Consider surgical referral.  I think medically he is a good candidate overall it does sound like this may have been symptomatic during his recent seizure.  Or at least after it.  I think on the one hand it could be good to repair this now before he potentially has any decompensation of his liver disease but watchful waiting may be appropriate as well.  I anticipate most likely sending him for a surgical consult to work this out but we have other work to do first. Handout provided to the patient today.  Explained that if he has persistent pain induration in that area he needs to get prompt evaluation.  Personal history of colonic polyps Plan for repeat colonoscopy at the same time of his EGD.  June 30.The risks and benefits as well as alternatives of endoscopic procedure(s) have been discussed and reviewed. All questions answered. The patient agrees to proceed.    I appreciate the opportunity to care for this patient. CC: Yvonna Alanis, NP   Subjective:   Chief Complaint: Follow-up of cirrhosis  HPI Francisco [name] is a 65 year old white man with a history of cirrhosis due to hepatitis C (treated),  as well as secondary esophageal varices with bleeding and some question of hepatic encephalopathy in the past who is here for follow-up after last visit in 2018.  A few weeks ago he had a recurrent seizure longstanding epilepsy and he noticed that his hernia became inverted and red and a bit tender.  He has an umbilical hernia.  No signs of GI bleeding with melena or vomiting.  He does not have problems with ascites or edema.  He is not on lactulose as he has been in the past, he denies confusion excessive sleepiness etc.  Social history notable for father living with him now in the last couple of years after having needed percutaneous cholecystostomy tube and I think eventual cholecystectomy but Francisco [name] is helping his dad who is 11.  Father's dog also lives with them and his dog.  Last EGD was in August 2018 as part of a follow-up after bleeding varices that year and he had banding at that time.  In the interim he was referred through primary care to Dr. Irene Limbo regarding thrombocytopenia and it was appropriately concluded that this is from his portal hypertension.  He also has mild leukopenia.   Platelets are 62,000 white count 3.8 hemoglobin 14.5 on April 12.  LFTs and albumin normal.  Kidney function normal.  B12 borderline at 221 ferritin 129  Wt Readings from Last 3 Encounters:  05/14/21 213 lb 2 oz (96.7 kg)  04/01/21 215 lb (97.5 kg)  03/27/21 213 lb (96.6 kg)   102 kg  2018 Allergies  Allergen Reactions  . Naproxen Other (See Comments)    Esophageal bleeding  . Nsaids     Avoid due to liver condition    Current Meds  Medication Sig  . acetaminophen (TYLENOL) 500 MG tablet Take 500 mg by mouth as needed.  [name] albuterol (VENTOLIN HFA) 108 (90 Base) MCG/ACT inhaler Inhale 2 puffs into the lungs every 6 (six) hours as needed for wheezing or shortness of breath.  . levETIRAcetam (KEPPRA) 500 MG tablet TAKE 1 TABLET BY MOUTH TWICE A DAY   Past Medical History:  Diagnosis Date  . Allergy  to [place] pig dander   . Arthritis    "hands and knees the most; mild" (03/15/2017)  . Benign neoplasm of colon 07/19/2014   07/19/14 - 2 adenomas - repeat colonoscopy 2020   . Cirrhosis ([place])   . Diverticulosis 2008  . Epilepsy ([place])   . Esophageal varices with bleeding ([place]) 07/19/2014  . Falls related to headaches   . Fatty liver 2011   Abd [name]  . Headaches thought due to old head injury   . Hepatic cirrhosis due to chronic hepatitis C infection ([place]) and some component of alcohol use 07/19/2014  . Hepatitis C   . History of blood transfusion 1987   "related to the zygomatic arch fracture"  . Iron deficiency anemia secondary to blood loss (chronic) 07/19/2014  . Portal hypertensive gastropathy ([place]) 07/19/2014  . Scoliosis   . Torn rotator cuff    Past Surgical History:  Procedure Laterality Date  . ABDOMINAL SURGERY     Knife puncture to Stomach  . COLONOSCOPY  2008  . COLONOSCOPY N/A 07/19/2014   Procedure: COLONOSCOPY;  Surgeon: Gatha Mayer, MD;  Location: [facility];  Service: Endoscopy;  Laterality: N/A;  . ESOPHAGEAL BANDING  04/14/2017   Procedure: ESOPHAGEAL BANDING;  Surgeon: Gatha Mayer, MD;  Location: [facility];  Service: Endoscopy;;  . ESOPHAGOGASTRODUODENOSCOPY N/A 07/19/2014   Procedure: ESOPHAGOGASTRODUODENOSCOPY (EGD);  Surgeon: Gatha Mayer, MD;  Location: [facility] ENDOSCOPY;  Service: Endoscopy;  Laterality: N/A;  . ESOPHAGOGASTRODUODENOSCOPY N/A 09/05/2014   Procedure: ESOPHAGOGASTRODUODENOSCOPY (EGD);  Surgeon: Gatha Mayer, MD;  Location: [facility];  Service: Endoscopy;  Laterality: N/A;  . ESOPHAGOGASTRODUODENOSCOPY N/A 10/17/2014   Procedure: ESOPHAGOGASTRODUODENOSCOPY (EGD);  Surgeon: Gatha Mayer, MD;  Location: [facility];  Service: Endoscopy;  Laterality: N/A;  . ESOPHAGOGASTRODUODENOSCOPY N/A 12/19/2014   Procedure: ESOPHAGOGASTRODUODENOSCOPY (EGD);  Surgeon: Gatha Mayer, MD;  Location: [facility];  Service: Endoscopy;  Laterality: N/A;   . ESOPHAGOGASTRODUODENOSCOPY N/A 03/16/2017   Procedure: ESOPHAGOGASTRODUODENOSCOPY (EGD);  Surgeon: Ladene Artist, MD;  Location[facility] ENDOSCOPY;  Service: Endoscopy;  Laterality: N/A;  . ESOPHAGOGASTRODUODENOSCOPY (EGD) WITH PROPOFOL N/A 11/21/2015   Procedure: ESOPHAGOGASTRODUODENOSCOPY (EGD) WITH PROPOFOL;  Surgeon: Gatha Mayer, MD;  Location: [facility];  Service: Endoscopy;  Laterality: N/A;  . ESOPHAGOGASTRODUODENOSCOPY (EGD) WITH PROPOFOL N/A 04/14/2017   Procedure: ESOPHAGOGASTRODUODENOSCOPY (EGD) WITH PROPOFOL;  Surgeon: Gatha Mayer, MD;  Location: [facility];  Service: Endoscopy;  Laterality: N/A;  . ESOPHAGOGASTRODUODENOSCOPY (EGD) WITH PROPOFOL N/A 07/23/2017   Procedure: ESOPHAGOGASTRODUODENOSCOPY (EGD) WITH PROPOFOL;  Surgeon: Gatha Mayer, MD;  Location: [facility];  Service: Endoscopy;  Laterality: N/A;  . FRACTURE SURGERY Right    cheek  . GASTRIC VARICES BANDING N/A 10/17/2014   Procedure: GASTRIC VARICES BANDING;  Surgeon: Gatha Mayer, MD;  Location: [facility] ENDOS[facility];  Service: Endoscopy;  Laterality: N/A;  . ORIF ZYGOMATIC FRACTURE Right 1987  .  RADIUS SYNOSTOSIS PROXIMAL RESECTION W/ ALLOGRAFT Right 1980s   at the elbow  . 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 Lives with his dog in a 1 story home Father and his dog moved 2020.    Right handed   One story home         Marital status:   Divorced                            What year were you married ? 1990      Highest Level of education completed: 2/1/2 Years college      Current or past profession: Health and safety inspector       Do you exercise?   Yes                           Type & how often Walk every day  ( 1/2-2 miles) [employer] work      ADVANCED DIRECTIVES (Please bring copies)      Do you  have a living will? Yes      Do you have a DNR form?   Yes                    If not, do you want to discuss one?       Do you have signed POA?HPOA forms? Yes                If so, please bring to your appointment      FUNCTIONAL STATUS- To be completed by Spouse / child / Staff       Do you have difficulty bathing or dressing yourself ?  No      Do you have difficulty preparing food or eating ?  No      Do you have difficulty managing your mediation ?  No      Do you have difficulty managing your finances ?  No      Do you have difficulty affording your medication ?  No      family history includes Alcohol abuse in his maternal grandfather and maternal uncle; Ankylosing spondylitis (age of onset: 34) in his brother; Arthritis/Rheumatoid in his father; Breast cancer (age of onset: 74) in his mother; Cancer in his paternal grandfather; Crohn's disease (age of onset: 32) in his father; Depression (age of onset: 49) in his daughter; Epilepsy (age of onset: 37) in his sister; Hearing loss in his father; Heart disease in his maternal grandmother.   Review of Systems Intermittent neck muscle pains.  All other review of systems are negative.  Or as per HPI.  Objective:   Physical Exam BP (!) 94/54 (BP Location: Left Arm, Patient Position: Sitting, Cuff Size: Normal)   Pulse 68   Ht 5' 10.5" (1.791 m) Comment: height measured without shoes  Wt 213 lb 2 oz (96.7 kg)   BMI 30.15 kg/m  Elderly wm NAD Lungs cta Cor NL S1S2 no rmg abd soft, mildly obese - ping-pong ball sized umbilical hernia soft and reducible, no HSM/mass Skin - spider angiomata Ext no edema neuro - alert and oriented x 3 no asterixis  Data reviewed includes hematology notes labs  previous GI notes and endoscopy reports.  Primary care notes.

## 2021-05-14 NOTE — Assessment & Plan Note (Signed)
Reassessment EGD

## 2021-05-14 NOTE — Assessment & Plan Note (Signed)
Reassess possibly bad with EGD.  He has not tolerated beta-blockers in the past.

## 2021-05-15 LAB — AFP TUMOR MARKER: AFP-Tumor Marker: 3.5 ng/mL (ref <6.1)

## 2021-05-26 ENCOUNTER — Other Ambulatory Visit: Payer: Self-pay

## 2021-05-26 ENCOUNTER — Ambulatory Visit (HOSPITAL_COMMUNITY)
Admission: RE | Admit: 2021-05-26 | Discharge: 2021-05-26 | Disposition: A | Discharge status: Home / Self Care | Payer: Medicare Other | Source: Ambulatory Visit | Attending: Internal Medicine | Admitting: Internal Medicine

## 2021-05-26 DIAGNOSIS — K7460 Unspecified cirrhosis of liver: Secondary | ICD-10-CM | POA: Insufficient documentation

## 2021-05-26 DIAGNOSIS — B182 Chronic viral hepatitis C: Secondary | ICD-10-CM | POA: Insufficient documentation

## 2021-05-26 DIAGNOSIS — K8020 Calculus of gallbladder without cholecystitis without obstruction: Secondary | ICD-10-CM | POA: Diagnosis not present

## 2021-06-11 ENCOUNTER — Other Ambulatory Visit: Payer: Self-pay | Admitting: *Deleted

## 2021-06-11 DIAGNOSIS — R053 Chronic cough: Secondary | ICD-10-CM

## 2021-06-11 MED ORDER — ALBUTEROL SULFATE HFA 108 (90 BASE) MCG/ACT IN AERS: 2.0000 | INHALATION_SPRAY | Freq: Four times a day (QID) | RESPIRATORY_TRACT | 0 refills | Status: DC | PRN

## 2021-06-11 NOTE — Telephone Encounter (Signed)
Patient requested refill

## 2021-06-16 ENCOUNTER — Other Ambulatory Visit: Payer: Self-pay

## 2021-06-16 NOTE — Progress Notes (Signed)
Attempted to obtain medical history via telephone, unable to reach at this time. I left a voicemail to return pre surgical testing department's phone call.  

## 2021-06-19 ENCOUNTER — Ambulatory Visit (HOSPITAL_COMMUNITY): Payer: Medicare Other | Admitting: Anesthesiology

## 2021-06-19 ENCOUNTER — Encounter (HOSPITAL_COMMUNITY)
Admission: RE | Disposition: A | Discharge status: Home / Self Care | Payer: Self-pay | Source: Home / Self Care | Attending: Internal Medicine

## 2021-06-19 ENCOUNTER — Ambulatory Visit (HOSPITAL_COMMUNITY)
Admission: RE | Admit: 2021-06-19 | Discharge: 2021-06-19 | Disposition: A | Discharge status: Home / Self Care | Payer: Medicare Other | Attending: Internal Medicine | Admitting: Internal Medicine

## 2021-06-19 ENCOUNTER — Other Ambulatory Visit: Payer: Self-pay

## 2021-06-19 ENCOUNTER — Encounter (HOSPITAL_COMMUNITY): Payer: Self-pay | Admitting: Internal Medicine

## 2021-06-19 DIAGNOSIS — Z1211 Encounter for screening for malignant neoplasm of colon: Secondary | ICD-10-CM | POA: Diagnosis not present

## 2021-06-19 DIAGNOSIS — Z886 Allergy status to analgesic agent status: Secondary | ICD-10-CM | POA: Diagnosis not present

## 2021-06-19 DIAGNOSIS — I8511 Secondary esophageal varices with bleeding: Secondary | ICD-10-CM | POA: Diagnosis not present

## 2021-06-19 DIAGNOSIS — K7460 Unspecified cirrhosis of liver: Secondary | ICD-10-CM | POA: Diagnosis not present

## 2021-06-19 DIAGNOSIS — Z79899 Other long term (current) drug therapy: Secondary | ICD-10-CM | POA: Insufficient documentation

## 2021-06-19 DIAGNOSIS — Z86010 Personal history of colonic polyps: Secondary | ICD-10-CM | POA: Insufficient documentation

## 2021-06-19 DIAGNOSIS — F17210 Nicotine dependence, cigarettes, uncomplicated: Secondary | ICD-10-CM | POA: Diagnosis not present

## 2021-06-19 DIAGNOSIS — K766 Portal hypertension: Secondary | ICD-10-CM

## 2021-06-19 DIAGNOSIS — K3189 Other diseases of stomach and duodenum: Secondary | ICD-10-CM | POA: Diagnosis not present

## 2021-06-19 DIAGNOSIS — B182 Chronic viral hepatitis C: Secondary | ICD-10-CM

## 2021-06-19 DIAGNOSIS — I8510 Secondary esophageal varices without bleeding: Secondary | ICD-10-CM | POA: Diagnosis not present

## 2021-06-19 DIAGNOSIS — Z8379 Family history of other diseases of the digestive system: Secondary | ICD-10-CM | POA: Diagnosis not present

## 2021-06-19 HISTORY — PX: ESOPHAGOGASTRODUODENOSCOPY (EGD) WITH PROPOFOL: SHX5813

## 2021-06-19 HISTORY — PX: COLONOSCOPY WITH PROPOFOL: SHX5780

## 2021-06-19 SURGERY — ESOPHAGOGASTRODUODENOSCOPY (EGD) WITH PROPOFOL
Anesthesia: Monitor Anesthesia Care

## 2021-06-19 MED ORDER — LACTATED RINGERS IV SOLN: INTRAVENOUS | Status: DC

## 2021-06-19 MED ORDER — DEXMEDETOMIDINE (PRECEDEX) IN NS 20 MCG/5ML (4 MCG/ML) IV SYRINGE
PREFILLED_SYRINGE | INTRAVENOUS | Status: AC
  Filled 2021-06-19: qty 5

## 2021-06-19 MED ORDER — DEXMEDETOMIDINE (PRECEDEX) IN NS 20 MCG/5ML (4 MCG/ML) IV SYRINGE
PREFILLED_SYRINGE | INTRAVENOUS | Status: DC | PRN
  Administered 2021-06-19 (×3): 4 ug via INTRAVENOUS

## 2021-06-19 MED ORDER — EPHEDRINE SULFATE-NACL 50-0.9 MG/10ML-% IV SOSY
PREFILLED_SYRINGE | INTRAVENOUS | Status: DC | PRN
  Administered 2021-06-19: 5 mg via INTRAVENOUS

## 2021-06-19 MED ORDER — SODIUM CHLORIDE 0.9 % IV SOLN: INTRAVENOUS | Status: DC

## 2021-06-19 MED ORDER — PROPOFOL 500 MG/50ML IV EMUL
INTRAVENOUS | Status: DC | PRN
  Administered 2021-06-19: 150 ug/kg/min via INTRAVENOUS

## 2021-06-19 MED ORDER — PROPOFOL 1000 MG/100ML IV EMUL
INTRAVENOUS | Status: AC
  Filled 2021-06-19: qty 100

## 2021-06-19 MED ORDER — PROPOFOL 10 MG/ML IV BOLUS
INTRAVENOUS | Status: DC | PRN
  Administered 2021-06-19: 40 mg via INTRAVENOUS

## 2021-06-19 MED ORDER — LIDOCAINE HCL (CARDIAC) PF 100 MG/5ML IV SOSY
PREFILLED_SYRINGE | INTRAVENOUS | Status: DC | PRN
  Administered 2021-06-19: 100 mg via INTRAVENOUS

## 2021-06-19 MED ORDER — DEXMEDETOMIDINE (PRECEDEX) IN NS 20 MCG/5ML (4 MCG/ML) IV SYRINGE
PREFILLED_SYRINGE | INTRAVENOUS | Status: AC
  Filled 2021-06-19: qty 10

## 2021-06-19 SURGICAL SUPPLY — 25 items

## 2021-06-19 NOTE — Op Note (Signed)
[facility] Patient Name: Francisco [name] Procedure Date: 06/19/2021 MRN: [id number] Attending MD: Gatha Mayer , MD Date of Birth: 1956/03/09 CSN: [id number] Age: 66 Admit Type: Outpatient Procedure:                Upper GI endoscopy Indications:              Esophageal varices, Follow-up of esophageal varices Providers:                Gatha Mayer, MD, Burtis Junes, RN, Tyna Jaksch                            Technician Referring MD:              Medicines:                Propofol per Anesthesia, Monitored Anesthesia Care Complications:            No immediate complications. Estimated Blood Loss:     Estimated blood loss: none. Procedure:                Pre-Anesthesia Assessment:                           - Prior to the procedure, a History and Physical                            was performed, and patient medications and                            allergies were reviewed. The patient's tolerance of                            previous anesthesia was also reviewed. The risks                            and benefits of the procedure and the sedation                            options and risks were discussed with the patient.                            All questions were answered, and informed consent                            was obtained. Prior Anticoagulants: The patient has                            taken no previous anticoagulant or antiplatelet                            agents. ASA Grade Assessment: III - A patient with                            severe systemic disease. After reviewing the risks  and benefits, the patient was deemed in                            satisfactory condition to undergo the procedure.                           After obtaining informed consent, the endoscope was                            passed under direct vision. Throughout the                            procedure, the patient's blood pressure, pulse,  and                            oxygen saturations were monitored continuously. The                            [id number]-H190 ([id number]) Olympus gastroscope was                            introduced through the mouth, and advanced to the                            second part of duodenum. The upper GI endoscopy was                            accomplished without difficulty. The patient                            tolerated the procedure well. Scope In: Scope Out: Findings:      Grade I, small (< 5 mm) varices were found in the distal esophagus. They       were 4 mm in largest diameter.      Moderate portal hypertensive gastropathy was found in the entire       examined stomach.      The exam was otherwise without abnormality.      The cardia and gastric fundus were otherwise normal on retroflexion. Impression:               - Grade I and small (< 5 mm) esophageal varices.                           - Portal hypertensive gastropathy.                           - The examination was otherwise normal.                           - No specimens collected. Moderate Sedation:      Not Applicable - Patient had care per Anesthesia. Recommendation:           - Patient has a contact number available for  emergencies. The signs and symptoms of potential                            delayed complications were discussed with the                            patient. Return to normal activities tomorrow.                            Written discharge instructions were provided to the                            patient.                           - Resume previous diet.                           - Continue present medications.                           - See the other procedure note for documentation of                            additional recommendations.                           - Repeat upper endoscopy in 1 year for surveillance.                           - Return to GI office in 6  months. Procedure Code(s):        --- Professional ---                           [phone], Esophagogastroduodenoscopy, flexible,                            transoral; diagnostic, including collection of                            specimen(s) by brushing or washing, when performed                            (separate procedure) Diagnosis Code(s):        --- Professional ---                           I85.00, Esophageal varices without bleeding                           K76.6, Portal hypertension                           K31.89, Other diseases of stomach and duodenum CPT copyright 2019 American Medical Association. All rights reserved. The codes documented in this report are preliminary and upon coder review may  be revised to meet current compliance requirements.  Gatha Mayer, MD 06/19/2021 9:51:46 AM This report has been signed electronically. Number of Addenda: 0

## 2021-06-19 NOTE — Op Note (Signed)
[facility] Patient Name: Francisco [name] Procedure Date: 06/19/2021 MRN: [id number] Attending MD: Gatha Mayer , MD Date of Birth: Aug 05, 1956 CSN: [id number] Age: 65 Admit Type: Outpatient Procedure:                Colonoscopy Indications:              Screening for colorectal malignant neoplasm Providers:                Gatha Mayer, MD, Burtis Junes, RN, Tyna Jaksch                            Technician Referring MD:              Medicines:                Propofol per Anesthesia, Monitored Anesthesia Care Complications:            No immediate complications. Estimated Blood Loss:     Estimated blood loss: none. Estimated blood loss:                            none. Procedure:                Pre-Anesthesia Assessment:                           - Prior to the procedure, a History and Physical                            was performed, and patient medications and                            allergies were reviewed. The patient's tolerance of                            previous anesthesia was also reviewed. The risks                            and benefits of the procedure and the sedation                            options and risks were discussed with the patient.                            All questions were answered, and informed consent                            was obtained. Prior Anticoagulants: The patient has                            taken no previous anticoagulant or antiplatelet                            agents. ASA Grade Assessment: III - A patient with  severe systemic disease. After reviewing the risks                            and benefits, the patient was deemed in                            satisfactory condition to undergo the procedure.                           - Prior to the procedure, a History and Physical                            was performed, and patient medications and                            allergies  were reviewed. The patient's tolerance of                            previous anesthesia was also reviewed. The risks                            and benefits of the procedure and the sedation                            options and risks were discussed with the patient.                            All questions were answered, and informed consent                            was obtained. Prior Anticoagulants: The patient has                            taken no previous anticoagulant or antiplatelet                            agents. ASA Grade Assessment: III - A patient with                            severe systemic disease. After reviewing the risks                            and benefits, the patient was deemed in                            satisfactory condition to undergo the procedure.                           After obtaining informed consent, the colonoscope                            was passed under direct vision. Throughout the  procedure, the patient's blood pressure, pulse, and                            oxygen saturations were monitored continuously. The                            [id number]-HQ190L ([id number]) Olympus colonoscope was                            introduced through the anus and advanced to the the                            cecum, identified by appendiceal orifice and                            ileocecal valve. The colonoscopy was performed                            without difficulty. The patient tolerated the                            procedure well. The quality of the bowel                            preparation was good. The ileocecal valve,                            appendiceal orifice, and rectum were photographed.                            The bowel preparation used was Miralax via split                            dose instruction. Scope In: 9:27:20 AM Scope Out: 9:39:11 AM Total Procedure Duration: 0 hours 11 minutes 51 seconds   Findings:      The perianal and digital rectal examinations were normal.      The entire examined colon appeared normal on direct and retroflexion       views. Impression:               - The entire examined colon is normal on direct and                            retroflexion views.                           - No specimens collected. Moderate Sedation:      Not Applicable - Patient had care per Anesthesia. Recommendation:           - Patient has a contact number available for                            emergencies. The signs and symptoms of potential  delayed complications were discussed with the                            patient. Return to normal activities tomorrow.                            Written discharge instructions were provided to the                            patient.                           - Resume previous diet.                           - Continue present medications.                           - Repeat colonoscopy in 10 years for screening                            purposes. Procedure Code(s):        --- Professional ---                           J6734, Colorectal cancer screening; colonoscopy on                            individual not meeting criteria for high risk Diagnosis Code(s):        --- Professional ---                           Z12.11, Encounter for screening for malignant                            neoplasm of colon CPT copyright 2019 American Medical Association. All rights reserved. The codes documented in this report are preliminary and upon coder review may  be revised to meet current compliance requirements. Gatha Mayer, MD 06/19/2021 9:57:32 AM This report has been signed electronically. Number of Addenda: 0

## 2021-06-19 NOTE — H&P (Addendum)
[facility] History and Physical   Primary Care Physician:  Yvonna Alanis, NP   Reason for Procedure:   Varices surveillance colon cancer screening  Plan:    EGD and colonoscopy     HPI: Francisco [name] is a 65 y.o. male here for EGD and colonoscopy as per reasons above.   Past Medical History:  Diagnosis Date   Allergy to [place] pig dander    Arthritis    "hands and knees the most; mild" (03/15/2017)   Benign neoplasm of colon 07/19/2014   07/19/14 - 2 adenomas - repeat colonoscopy 2020    Cirrhosis ([place])    Diverticulosis 2008   Epilepsy ([place])    Esophageal varices with bleeding ([place]) 07/19/2014   Falls related to headaches    Fatty liver 2011   Abd [name]   Headaches thought due to old head injury    Hepatic cirrhosis due to chronic hepatitis C infection ([place]) and some component of alcohol use 07/19/2014   Hepatitis C    History of blood transfusion 1987   "related to the zygomatic arch fracture"   Iron deficiency anemia secondary to blood loss (chronic) 07/19/2014   Portal hypertensive gastropathy ([place]) 07/19/2014   Scoliosis    Torn rotator cuff     Past Surgical History:  Procedure Laterality Date   ABDOMINAL SURGERY     Knife puncture to Stomach   COLONOSCOPY  2008   COLONOSCOPY N/A 07/19/2014   Procedure: COLONOSCOPY;  Surgeon: Gatha Mayer, MD;  Location: [place];  Service: Endoscopy;  Laterality: N/A;   ESOPHAGEAL BANDING  04/14/2017   Procedure: ESOPHAGEAL BANDING;  Surgeon: Gatha Mayer, MD;  Location: [facility] ENDOSCOPY;  Service: Endoscopy;;   ESOPHAGOGASTRODUODENOSCOPY N/A 07/19/2014   Procedure: ESOPHAGOGASTRODUODENOSCOPY (EGD);  Surgeon: Gatha Mayer, MD;  Location: [facility] ENDOSCOPY;  Service: Endoscopy;  Laterality: N/A;   ESOPHAGOGASTRODUODENOSCOPY N/A 09/05/2014   Procedure: ESOPHAGOGASTRODUODENOSCOPY (EGD);  Surgeon: Gatha Mayer, MD;  Location: [facility];  Service: Endoscopy;  Laterality: N/A;   ESOPHAGOGASTRODUODENOSCOPY N/A  10/17/2014   Procedure: ESOPHAGOGASTRODUODENOSCOPY (EGD);  Surgeon: Gatha Mayer, MD;  Location: [facility];  Service: Endoscopy;  Laterality: N/A;   ESOPHAGOGASTRODUODENOSCOPY N/A 12/19/2014   Procedure: ESOPHAGOGASTRODUODENOSCOPY (EGD);  Surgeon: Gatha Mayer, MD;  Location: [facility];  Service: Endoscopy;  Laterality: N/A;   ESOPHAGOGASTRODUODENOSCOPY N/A 03/16/2017   Procedure: ESOPHAGOGASTRODUODENOSCOPY (EGD);  Surgeon: Ladene Artist, MD;  Location: [facility] ENDOSCOPY;  Service: Endoscopy;  Laterality: N/A;   ESOPHAGOGASTRODUODENOSCOPY (EGD) WITH PROPOFOL N/A 11/21/2015   Procedure: ESOPHAGOGASTRODUODENOSCOPY (EGD) WITH PROPOFOL;  Surgeon: Gatha Mayer, MD;  Location: [facility];  Service: Endoscopy;  Laterality: N/A;   ESOPHAGOGASTRODUODENOSCOPY (EGD) WITH PROPOFOL N/A 04/14/2017   Procedure: ESOPHAGOGASTRODUODENOSCOPY (EGD) WITH PROPOFOL;  Surgeon: Gatha Mayer, MD;  Location: [facility];  Service: Endoscopy;  Laterality: N/A;   ESOPHAGOGASTRODUODENOSCOPY (EGD) WITH PROPOFOL N/A 07/23/2017   Procedure: ESOPHAGOGASTRODUODENOSCOPY (EGD) WITH PROPOFOL;  Surgeon: Gatha Mayer, MD;  Location: [facility];  Service: Endoscopy;  Laterality: N/A;   FRACTURE SURGERY Right    cheek   GASTRIC VARICES BANDING N/A 10/17/2014   Procedure: GASTRIC VARICES BANDING;  Surgeon: Gatha Mayer, MD;  Location: [facility] ENDOSCOPY;  Service: Endoscopy;  Laterality: N/A;   ORIF ZYGOMATIC FRACTURE Right 1987   RADIUS SYNOSTOSIS PROXIMAL RESECTION W/ ALLOGRAFT Right 1980s   at the elbow   THYROIDECTOMY, PARTIAL Left 2012    Prior to Admission medications   Medication Sig Start Date End Date Taking?  Authorizing Provider  acetaminophen (TYLENOL) 500 MG tablet Take 500 mg by mouth every 4 (four) hours as needed for moderate pain or mild pain.   Yes [provider]  levETIRAcetam (KEPPRA) 500 MG tablet TAKE 1 TABLET BY MOUTH TWICE A DAY Patient taking differently: Take 500 mg by mouth 2 (two) times  daily. 02/18/21  Yes Jaffe, Adam R, DO  Multiple Vitamins-Minerals (MULTIVITAMIN WITH MINERALS) tablet Take 1 tablet by mouth daily.   Yes [provider]  albuterol (VENTOLIN HFA) 108 (90 Base) MCG/ACT inhaler Inhale 2 puffs into the lungs every 6 (six) hours as needed for wheezing or shortness of breath. Patient not taking: Reported on 06/12/2021 06/11/21   Yvonna Alanis, NP    Current Facility-Administered Medications  Medication Dose Route Frequency Provider Last Rate Last Admin   0.9 %  sodium chloride infusion   Intravenous Continuous Gatha Mayer, MD       lactated ringers infusion   Intravenous Continuous Gatha Mayer, MD 20 mL/hr at 06/19/21 0853 New Bag at 06/19/21 0853    Allergies as of 05/14/2021 - Review Complete 05/14/2021  Allergen Reaction Noted   Naproxen Other (See Comments) 01/30/2016   Nsaids  04/09/2017    Family History  Problem Relation Age of Onset   Breast cancer Mother 42   Crohn's disease Father 32   Arthritis/Rheumatoid Father    Hearing loss Father    Epilepsy Sister 39   Ankylosing spondylitis Brother 59   Heart disease Maternal Grandmother    Alcohol abuse Maternal Grandfather    Cancer Paternal Grandfather        type unknown, mets   Alcohol abuse Maternal Uncle        several   Depression Daughter 76    Social History   Socioeconomic History   Marital status: Divorced    Spouse name: Not on file   Number of children: 1   Years of education: some college   Highest education level: Not on file  Occupational History   Occupation: Scientist, 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 Lives with his dog in a 1 story home Father and his dog moved 2020.    Right handed   One story home         Marital status:   Divorced                            What year were you married ? 1990      Highest Level of education completed: 2/1/2 Years college      Current or past  profession: Health and safety inspector       Do you exercise?   Yes                           Type & how often Walk every day  ( 1/2-2 miles) [employer] work      ADVANCED DIRECTIVES (Please bring copies)      Do you have a living will? Yes      Do you have a DNR form?   Yes                    If not, do you want to discuss one?       Do you have signed POA?HPOA forms? Yes                If so, please bring to your appointment      FUNCTIONAL STATUS- To be completed by Spouse / child / Staff       Do you have difficulty bathing or dressing yourself ?  No      Do you have difficulty preparing food or eating ?  No      Do you have difficulty managing your mediation ?  No      Do you have difficulty managing your finances ?  No      Do you have difficulty affording your medication ?  No      Social Determinants of Radio broadcast assistant Strain: Not on file  Food Insecurity: Not on file  Transportation Needs: Not on file  Physical Activity: Not on file  Stress: Not on file  Social Connections: Not on file  Intimate Partner Violence: Not on file    Review of Systems:  All other review of systems negative except as mentioned in the HPI.  Physical Exam: Vital signs in last 24 hours: Temp:  [96.4 F (35.8 C)] 96.4 F (35.8 C) (06/30 0844) BP: (135)/(84) 135/84 (06/30  0849) Weight:  [95.3 kg] 95.3 kg (06/30 0849)   General:   Alert,  Well-developed, well-nourished, pleasant and cooperative in NAD Lungs:  Clear throughout to auscultation.   Heart:  Regular rate and rhythm; no murmurs, clicks, rubs,  or gallops. Abdomen:  Soft, nontender and nondistended. Normal bowel sounds.   Neuro/Psych:  Alert and cooperative. Normal mood and affect. A and O x 3   @Odester Nilson  Simonne Maffucci, MD, [facility] [phone] (pager) 06/19/2021 9:14 AM@

## 2021-06-19 NOTE — Anesthesia Preprocedure Evaluation (Signed)
Anesthesia Evaluation  Patient identified by MRN, date of birth, ID band Patient awake    Reviewed: Allergy & Precautions, NPO status , Patient's Chart, lab work & pertinent test results  Airway Mallampati: II  TM Distance: >3 FB Neck ROM: Full    Dental no notable dental hx.    Pulmonary Current Smoker,    Pulmonary exam normal breath sounds clear to auscultation       Cardiovascular negative cardio ROS Normal cardiovascular exam Rhythm:Regular Rate:Normal     Neuro/Psych negative neurological ROS  negative psych ROS   GI/Hepatic negative GI ROS, (+) Cirrhosis   Esophageal Varices    , Hepatitis -  Endo/Other  negative endocrine ROS  Renal/GU negative Renal ROS  negative genitourinary   Musculoskeletal negative musculoskeletal ROS (+)   Abdominal   Peds negative pediatric ROS (+)  Hematology negative hematology ROS (+)   Anesthesia Other Findings   Reproductive/Obstetrics negative OB ROS                             Anesthesia Physical Anesthesia Plan  ASA: 3  Anesthesia Plan: MAC   Post-op Pain Management:    Induction: Intravenous  PONV Risk Score and Plan: 1 and Propofol infusion and Treatment may vary due to age or medical condition  Airway Management Planned: Simple Face Mask  Additional Equipment:   Intra-op Plan:   Post-operative Plan:   Informed Consent: I have reviewed the patients History and Physical, chart, labs and discussed the procedure including the risks, benefits and alternatives for the proposed anesthesia with the patient or authorized representative who has indicated his/her understanding and acceptance.     Dental advisory given  Plan Discussed with: CRNA and Surgeon  Anesthesia Plan Comments:         Anesthesia Quick Evaluation

## 2021-06-19 NOTE — Discharge Instructions (Addendum)
The dilated veins also known as varices in your esophagus remain small.  I did not need to put any bands on them.  You still have the condition called portal hypertensive gastropathy it looks about the same.  You need a repeat EGD in 1 year.  The colonoscopy was fine no polyps no cancer.  We can consider repeating that in 10 years.  We will plan on an ultrasound and an office follow-up in about 6 months.  If you have any other problems in between let me know.  I appreciate the opportunity to care for you. Gatha Mayer, MD, FACG  YOU HAD AN ENDOSCOPIC PROCEDURE TODAY: Refer to the procedure report and other information in the discharge instructions given to you for any specific questions about what was found during the examination. If this information does not answer your questions, please call Dr. Celesta Aver office at [phone] to clarify.   YOU SHOULD EXPECT: Some feelings of bloating in the abdomen. Passage of more gas than usual. Walking can help get rid of the air that was put into your GI tract during the procedure and reduce the bloating. If you had a lower endoscopy (such as a colonoscopy or flexible sigmoidoscopy) you may notice spotting of blood in your stool or on the toilet paper. Some abdominal soreness may be present for a day or two, also.  DIET: Your first meal following the procedure should be a light meal and then it is ok to progress to your normal diet. A half-sandwich or bowl of soup is an example of a good first meal. Heavy or fried foods are harder to digest and may make you feel nauseous or bloated. Drink plenty of fluids but you should avoid alcoholic beverages for 24 hours.   ACTIVITY: Your care partner should take you home directly after the procedure. You should plan to take it easy, moving slowly for the rest of the day. You can resume normal activity the day after the procedure however YOU SHOULD NOT DRIVE, use power tools, machinery or perform tasks that involve  climbing or major physical exertion for 24 hours (because of the sedation medicines used during the test).   SYMPTOMS TO REPORT IMMEDIATELY: A gastroenterologist can be reached at any hour. Please call [phone]  for any of the following symptoms:  Following lower endoscopy (colonoscopy, flexible sigmoidoscopy) Excessive amounts of blood in the stool  Significant tenderness, worsening of abdominal pains  Swelling of the abdomen that is new, acute  Fever of 100 or higher  Following upper endoscopy (EGD, EUS, ERCP, esophageal dilation) Vomiting of blood or coffee ground material  New, significant abdominal pain  New, significant chest pain or pain under the shoulder blades  Painful or persistently difficult swallowing  New shortness of breath  Black, tarry-looking or red, bloody stools

## 2021-06-19 NOTE — Transfer of Care (Signed)
Immediate Anesthesia Transfer of Care Note  Patient: Francisco [name]  Procedure(s) Performed: ESOPHAGOGASTRODUODENOSCOPY (EGD) WITH PROPOFOL COLONOSCOPY WITH PROPOFOL  Patient Location: PACU  Anesthesia Type:MAC  Level of Consciousness: drowsy  Airway & Oxygen Therapy: Patient Spontanous Breathing and Patient connected to face mask oxygen  Post-op Assessment: Report given to RN and Post -op Vital signs reviewed and stable  Post vital signs: Reviewed and stable  Last Vitals:  Vitals Value Taken Time  BP    Temp    Pulse    Resp    SpO2      Last Pain:  Vitals:   06/19/21 0849  TempSrc:   PainSc: 0-No pain         Complications: No notable events documented.

## 2021-06-19 NOTE — Anesthesia Postprocedure Evaluation (Signed)
Anesthesia Post Note  Patient: Aashir Umholtz Grimm  Procedure(s) Performed: ESOPHAGOGASTRODUODENOSCOPY (EGD) WITH PROPOFOL COLONOSCOPY WITH PROPOFOL     Patient location during evaluation: PACU Anesthesia Type: MAC Level of consciousness: awake and alert Pain management: pain level controlled Vital Signs Assessment: post-procedure vital signs reviewed and stable Respiratory status: spontaneous breathing, nonlabored ventilation, respiratory function stable and patient connected to nasal cannula oxygen Cardiovascular status: stable and blood pressure returned to baseline Postop Assessment: no apparent nausea or vomiting Anesthetic complications: no   No notable events documented.  Last Vitals:  Vitals:   06/19/21 0947 06/19/21 0957  BP: (!) 95/50 (!) 92/54  Pulse: 65 62  Resp: 16 15  Temp: 36.4 C   SpO2: 99% 99%    Last Pain:  Vitals:   06/19/21 0957  TempSrc:   PainSc: 0-No pain                 Cuauhtemoc Huegel S

## 2021-06-21 ENCOUNTER — Encounter (HOSPITAL_COMMUNITY): Payer: Self-pay | Admitting: Internal Medicine

## 2021-06-27 ENCOUNTER — Ambulatory Visit
Admission: RE | Admit: 2021-06-27 | Discharge: 2021-06-27 | Disposition: A | Discharge status: Home / Self Care | Payer: Medicare Other | Source: Ambulatory Visit | Attending: Orthopedic Surgery | Admitting: Orthopedic Surgery

## 2021-06-27 DIAGNOSIS — I700 Atherosclerosis of aorta: Secondary | ICD-10-CM | POA: Diagnosis not present

## 2021-06-27 DIAGNOSIS — Z720 Tobacco use: Secondary | ICD-10-CM

## 2021-06-27 DIAGNOSIS — J479 Bronchiectasis, uncomplicated: Secondary | ICD-10-CM | POA: Diagnosis not present

## 2021-06-27 DIAGNOSIS — R053 Chronic cough: Secondary | ICD-10-CM

## 2021-07-01 NOTE — Progress Notes (Signed)
CT chest confirms interstitial lung disease. Advise seeing pulmonologist. Repeat scan advised in 12 months.

## 2021-07-03 ENCOUNTER — Other Ambulatory Visit: Payer: Self-pay | Admitting: Orthopedic Surgery

## 2021-07-03 DIAGNOSIS — J849 Interstitial pulmonary disease, unspecified: Secondary | ICD-10-CM

## 2021-07-03 NOTE — Progress Notes (Signed)
Referral made 

## 2021-08-18 ENCOUNTER — Ambulatory Visit: Payer: Medicare Other | Admitting: Pulmonary Disease

## 2021-08-18 ENCOUNTER — Encounter: Payer: Self-pay | Admitting: Pulmonary Disease

## 2021-08-18 ENCOUNTER — Other Ambulatory Visit: Payer: Self-pay

## 2021-08-18 VITALS — BP 120/62 | HR 70 | Temp 98.0°F | Ht 72.0 in | Wt 205.4 lb

## 2021-08-18 DIAGNOSIS — J849 Interstitial pulmonary disease, unspecified: Secondary | ICD-10-CM | POA: Diagnosis not present

## 2021-08-18 NOTE — Patient Instructions (Signed)
I have reviewed your CT scan which shows scarring in the lung. We will get some labs today for further evaluation of any rheumatologic process Schedule pulmonary function test Return to clinic after these tests for review and plan for next steps

## 2021-08-18 NOTE — Progress Notes (Signed)
Francisco [name]    KI:[id number]    1956/09/27  Primary Care Physician:[place], Amy E, NP  Referring Physician: Yvonna Alanis, NP [address],  [place] [address]  Chief complaint: Consult for interstitial lung disease  HPI: 65 year old with history of allergies, hepatitis C, cirrhosis referred for evaluation of abnormal finding seen on CT scan. Screening CT in early 2022 showed mild subpleural reticulation.  Follow-up high-resolution CT in July 2022 showed pulmonary fibrosis with honeycombing and has been referred here for further evaluation next Has complaints of chest congestion, no dyspnea, has small joint pain, stiffness Has strong family history of CTD with rheumatoid arthritis in father and ankylosing spondylitis and brother  He has history of cirrhosis secondary to hepatitis C which was treated with Harvoni.  Pets: No pets Occupation: Worked as a Freight forwarder for a [employer], community.  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  No mold, hot tub, Jacuzzi.  No feather pillows or comforters.  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   albuterol (VENTOLIN HFA) 108 (90 Base) MCG/ACT inhaler Inhale 2 puffs into the lungs every 6 (six) hours as needed for wheezing or shortness of breath.   levETIRAcetam (KEPPRA) 500 MG tablet Take 1 tablet (500 mg total) by mouth 2 (two) times daily.   Multiple Vitamins-Minerals (MULTIVITAMIN WITH MINERALS) tablet Take 1 tablet by mouth daily.    No facility-administered encounter medications on file as of 08/18/2021.    Allergies as of 08/18/2021 - Review Complete 08/18/2021  Allergen Reaction Noted   Naproxen Other (See Comments) 01/30/2016   Nsaids  04/09/2017    Past Medical History:  Diagnosis Date   Allergy to [place] pig dander    Arthritis    "hands and knees the most; mild" (03/15/2017)   Benign neoplasm of colon 07/19/2014   07/19/14 - 2 adenomas - repeat colonoscopy 2020    Cirrhosis ([place])    Diverticulosis 2008   Epilepsy ([place])    Esophageal varices with bleeding ([place]) 07/19/2014   Falls related to headaches    Fatty liver 2011   Abd [name]   Headaches thought due to old head injury    Hepatic cirrhosis due to chronic hepatitis C infection ([place]) and some component of alcohol use 07/19/2014   Hepatitis C    History of blood transfusion 1987   "related to the zygomatic arch fracture"   Iron deficiency anemia secondary to blood loss (chronic) 07/19/2014   Portal hypertensive gastropathy ([place]) 07/19/2014   Scoliosis    Torn rotator cuff     Past Surgical History:  Procedure Laterality Date   ABDOMINAL SURGERY     Knife puncture to Stomach   COLONOSCOPY  2008   COLONOSCOPY N/A 07/19/2014   Procedure: COLONOSCOPY;  Surgeon: Gatha Mayer, MD;  Location: [facility];  Service: Endoscopy;  Laterality: N/A;   COLONOSCOPY WITH PROPOFOL N/A 06/19/2021   Procedure: COLONOSCOPY WITH PROPOFOL;  Surgeon: Gatha Mayer, MD;  Location: [facility];  Service: Endoscopy;  Laterality: N/A;  ESOPHAGEAL BANDING  04/14/2017   Procedure: ESOPHAGEAL BANDING;  Surgeon: Gatha Mayer, MD;  Location: [facility];  Service: Endoscopy;;   ESOPHAGOGASTRODUODENOSCOPY N/A 07/19/2014   Procedure: ESOPHAGOGASTRODUODENOSCOPY (EGD);  Surgeon: Gatha Mayer, MD;  Location: [facility] ENDOSCOPY;  Service: Endoscopy;  Laterality: N/A;   ESOPHAGOGASTRODUODENOSCOPY N/A 09/05/2014   Procedure: ESOPHAGOGASTRODUODENOSCOPY (EGD);  Surgeon: Gatha Mayer,  MD;  Location: [facility];  Service: Endoscopy;  Laterality: N/A;   ESOPHAGOGASTRODUODENOSCOPY N/A 10/17/2014   Procedure: ESOPHAGOGASTRODUODENOSCOPY (EGD);  Surgeon: Gatha Mayer, MD;  Location: [facility];  Service: Endoscopy;  Laterality: N/A;   ESOPHAGOGASTRODUODENOSCOPY N/A 12/19/2014   Procedure: ESOPHAGOGASTRODUODENOSCOPY (EGD);  Surgeon: Gatha Mayer, MD;  Location: [facility];  Service: Endoscopy;  Laterality: N/A;   ESOPHAGOGASTRODUODENOSCOPY N/A 03/16/2017   Procedure: ESOPHAGOGASTRODUODENOSCOPY (EGD);  Surgeon: Ladene Artist, MD;  Location: [facility] ENDOSCOPY;  Service: Endoscopy;  Laterality: N/A;   ESOPHAGOGASTRODUODENOSCOPY (EGD) WITH PROPOFOL N/A 11/21/2015   Procedure: ESOPHAGOGASTRODUODENOSCOPY (EGD) WITH PROPOFOL;  Surgeon: Gatha Mayer, MD;  Location: [facility];  Service: Endoscopy;  Laterality: N/A;   ESOPHAGOGASTRODUODENOSCOPY (EGD) WITH PROPOFOL N/A 04/14/2017   Procedure: ESOPHAGOGASTRODUODENOSCOPY (EGD) WITH PROPOFOL;  Surgeon: Gatha Mayer, MD;  Location: [facility];  Service: Endoscopy;  Laterality: N/A;   ESOPHAGOGASTRODUODENOSCOPY (EGD) WITH PROPOFOL N/A 07/23/2017   Procedure: ESOPHAGOGASTRODUODENOSCOPY (EGD) WITH PROPOFOL;  Surgeon: Gatha Mayer, MD;  Location: [facility];  Service: Endoscopy;  Laterality: N/A;   ESOPHAGOGASTRODUODENOSCOPY (EGD) WITH PROPOFOL N/A 06/19/2021   Procedure: ESOPHAGOGASTRODUODENOSCOPY (EGD) WITH PROPOFOL;  Surgeon: Gatha Mayer, MD;  Location: [facility];  Service: Endoscopy;  Laterality: N/A;   FRACTURE SURGERY Right    cheek   GASTRIC VARICES BANDING N/A 10/17/2014   Procedure: GASTRIC VARICES BANDING;  Surgeon: Gatha Mayer, MD;  Location: [facility];  Service: Endoscopy;  Laterality: N/A;   ORIF ZYGOMATIC FRACTURE Right 1987   RADIUS SYNOSTOSIS PROXIMAL RESECTION W/ ALLOGRAFT Right 1980s   at the elbow   THYROIDECTOMY, PARTIAL Left 2012    Family History  Problem Relation Age of Onset   Breast cancer  Mother 21   Crohn's disease Father 67   Arthritis/Rheumatoid Father    Hearing loss Father    Epilepsy Sister 84   Ankylosing spondylitis Brother 106   Heart disease Maternal Grandmother    Alcohol abuse Maternal Grandfather    Cancer Paternal Grandfather        type unknown, mets   Alcohol abuse Maternal Uncle        several   Depression Daughter 63    Social History   Socioeconomic History   Marital status: Divorced    Spouse name: Not on file   Number of children: 1   Years of education: some college   Highest education level: Not on file  Occupational History   Occupation: Scientist, 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 Lives with his dog in a 1 story home Father and his dog moved 2020.    Right handed   One story home         Marital status:   Divorced                            What year were you married ? 1990      Highest Level of education completed: 2/1/2 Years college      Current or past profession: Health and safety inspector       Do you exercise?   Yes                           Type & how often Walk every day  ( 1/2-2 miles) [employer] work      ADVANCED DIRECTIVES (Please bring copies)      Do you have a living will? Yes      Do you have a DNR form?   Yes                    If not, do you  want to discuss one?       Do you have signed POA?HPOA forms? Yes                If so, please bring to your appointment      FUNCTIONAL STATUS- To be completed by Spouse / child / Staff       Do you have difficulty bathing or dressing yourself ?  No      Do you have difficulty preparing food or eating ?  No      Do you have difficulty managing your mediation ?  No      Do you have difficulty managing your finances ?  No      Do you have difficulty affording your medication ?  No      Social Determinants of Radio broadcast assistant Strain: Not on file  Food Insecurity: Not on file  Transportation Needs: Not on file  Physical Activity: Not on file  Stress: Not on file  Social Connections: Not on file  Intimate Partner Violence: Not on file    Review of systems: Review of Systems  Constitutional: Negative for fever and chills.  HENT: Negative.   Eyes: Negative for blurred vision.  Respiratory: as per HPI  Cardiovascular: Negative for chest pain and palpitations.  Gastrointestinal: Negative for vomiting, diarrhea, blood per rectum. Genitourinary: Negative for dysuria, urgency, frequency and hematuria.  Musculoskeletal: Negative for myalgias, back pain and joint pain.  Skin: Negative for itching and rash.  Neurological: Negative for dizziness, tremors, focal weakness, seizures and loss of consciousness.  Endo/Heme/Allergies: Negative for environmental allergies.  Psychiatric/Behavioral: Negative for depression, suicidal ideas and hallucinations.  All other systems reviewed and are negative.  Physical Exam: Blood pressure 120/62, pulse 70, temperature 98 F (36.7 C), temperature source Oral, height 6' (1.829 m), weight 205 lb 6.4 oz (93.2 kg), SpO2 95 %. Gen:      No acute distress HEENT:  EOMI, sclera anicteric Neck:     No masses; no thyromegaly Lungs:    Bibasal  crackles CV:         Regular rate and rhythm; no murmurs Abd:      + bowel sounds; soft, non-tender; no  palpable masses, no distension Ext:    Significant clubbing Skin:      Warm and dry; no rash Neuro: alert and oriented x 3 Psych: normal mood and affect  Data Reviewed: Imaging: CT lung cancer screening 04/05/2021-subpleural reticulation, calcified granuloma  High-resolution CT 06/27/2021-mild groundglass attenuation, subpleural reticulation with honeycombing most evident at the anterior aspect of the upper lobe, advanced cirrhosis I have reviewed the images personally  PFTs:  Labs:  Assessment:  Consult for interstitial lung disease He has pulmonary fibrosis with honeycombing to the pattern appears to be upper lung predominant in the anterior upper lobes.  Could be an unusual manifestation of IPF He does have strong family history of connective tissue disease and will need evaluation No significant exposures noted  Check CTD serologies, review scan at multidisciplinary conference Check PFTs Return to clinic after these tests for review and plan for next steps  Plan/Recommendations: Labs, PFTs Review scan at [place] MD [facility] 08/18/2021, 11:42 AM  CC: Yvonna Alanis, NP

## 2021-08-20 LAB — ANTI-NUCLEAR AB-TITER (ANA TITER): ANA Titer 1: 1:80 {titer} — ABNORMAL HIGH

## 2021-08-20 LAB — SJOGREN'S SYNDROME ANTIBODS(SSA + SSB)
SSA (Ro) (ENA) Antibody, IgG: <1 AI
SSB (La) (ENA) Antibody, IgG: <1 AI

## 2021-08-20 LAB — ANCA SCREEN W REFLEX TITER: ANCA Screen: NEGATIVE

## 2021-08-20 LAB — ANA,IFA RA DIAG PNL W/RFLX TIT/PATN
Anti Nuclear Antibody (ANA): POSITIVE — AB
Cyclic Citrullin Peptide Ab: <16 UNITS
Rheumatoid fact SerPl-aCnc: 16 IU/mL — ABNORMAL HIGH (ref <14)

## 2021-08-20 LAB — ANTI-SCLERODERMA ANTIBODY: Scleroderma (Scl-70) (ENA) Antibody, IgG: <1 AI

## 2021-08-21 ENCOUNTER — Encounter: Payer: Self-pay | Admitting: Pulmonary Disease

## 2021-09-02 ENCOUNTER — Ambulatory Visit: Payer: Self-pay | Admitting: Pulmonary Disease

## 2021-09-02 DIAGNOSIS — J849 Interstitial pulmonary disease, unspecified: Secondary | ICD-10-CM | POA: Diagnosis not present

## 2021-09-05 LAB — MYOMARKER 3 PLUS PROFILE (RDL)
Anti-EJ Ab (RDL): NEGATIVE
Anti-Jo-1 Ab (RDL): 20 Units — ABNORMAL HIGH (ref <20)
Anti-Ku Ab (RDL): NEGATIVE
Anti-MDA-5 Ab (CADM-140)(RDL): <20 Units (ref <20)
Anti-Mi-2 Ab (RDL): NEGATIVE
Anti-NXP-2 (P140) Ab (RDL): <20 Units (ref <20)
Anti-OJ Ab (RDL): NEGATIVE
Anti-PL-12 Ab (RDL: NEGATIVE
Anti-PL-7 Ab (RDL): NEGATIVE
Anti-PM/Scl-100 Ab (RDL): <20 Units (ref <20)
Anti-SAE1 Ab, IgG (RDL): <20 Units (ref <20)
Anti-SRP Ab (RDL): NEGATIVE
Anti-SS-A 52kD Ab, IgG (RDL): <20 Units (ref <20)
Anti-TIF-1gamma Ab (RDL): <20 Units (ref <20)
Anti-U1 RNP Ab (RDL): <20 Units (ref <20)
Anti-U2 RNP Ab (RDL): NEGATIVE
Anti-U3 RNP (Fibrillarin)(RDL): NEGATIVE

## 2021-09-05 LAB — HYPERSENSITIVITY PNEUMONITIS
A. Pullulans Abs: NEGATIVE
A.Fumigatus #1 Abs: NEGATIVE
Micropolyspora faeni, IgG: NEGATIVE
Pigeon Serum Abs: NEGATIVE
Thermoact. Saccharii: NEGATIVE
Thermoactinomyces vulgaris, IgG: NEGATIVE

## 2021-09-09 NOTE — Progress Notes (Signed)
   Interstitial Lung Disease Multidisciplinary Conference   Francisco [name]    MRN [id number]    DOB [birth year]  [facility], Francisco Gay, NP  Referring Physician: Marshell Garfinkel MD  Time of Conference: 7.30am- 8.30am Date of conference: 09/02/2021 Location of Conference: -  Virtual  Participating Pulmonary: Dr. Brand Males, MD,  Dr Marshell Garfinkel, MD Pathology: Dr Jaquita Folds, MD Radiology: Dr Salvatore Marvel MD  Others:   Brief History:  65 year old with history of allergies, hepatitis C, cirrhosis. He has strong family history of CTD and serlogies are pending.  Pulmonary fibrosis with honeycombing is in a pattern appears to be upper lung predominant in the anterior upper lobes. Is this still UIP pattern by ATS criteria? Could be an unusual manifestation of IPF or CTD ILD?  Serology:  Pending  MDD discussion of CT scan   HRCT 06/27/2021 CT shows moderate patchy reticulation with traction bronchiectasis, honeycombing with basilar predominance  Although there is increased disease in the anterior aspect of the upper lobe this process is felt to be more bullous emphysema and not honeycombing.  Pattern is still consistent with UIP by ATS criteria  Pathology discussion of biopsy:  PFTs:  Pending  MDD Impression/Recs:  CT scan shows fibrosis in UIP pattern by ATS criteria.  This could be consistent with IPF in the right clinical setting  Time Spent in preparation and discussion:  > 30 min  SIGNATURE   Marshell Garfinkel MD [facility] 09/09/2021, 10:36 AM

## 2021-10-03 ENCOUNTER — Inpatient Hospital Stay: Payer: Medicare Other | Attending: Hematology | Admitting: Hematology

## 2021-10-03 ENCOUNTER — Inpatient Hospital Stay: Payer: Medicare Other

## 2021-10-03 ENCOUNTER — Other Ambulatory Visit: Payer: Self-pay | Admitting: Hematology

## 2021-10-03 ENCOUNTER — Other Ambulatory Visit: Payer: Self-pay

## 2021-10-03 VITALS — BP 106/63 | HR 61 | Temp 97.9°F | Resp 17 | Ht 72.0 in | Wt 205.7 lb

## 2021-10-03 DIAGNOSIS — B182 Chronic viral hepatitis C: Secondary | ICD-10-CM | POA: Insufficient documentation

## 2021-10-03 DIAGNOSIS — Z8249 Family history of ischemic heart disease and other diseases of the circulatory system: Secondary | ICD-10-CM | POA: Insufficient documentation

## 2021-10-03 DIAGNOSIS — Z86010 Personal history of colonic polyps: Secondary | ICD-10-CM | POA: Insufficient documentation

## 2021-10-03 DIAGNOSIS — Z811 Family history of alcohol abuse and dependence: Secondary | ICD-10-CM | POA: Diagnosis not present

## 2021-10-03 DIAGNOSIS — Z8261 Family history of arthritis: Secondary | ICD-10-CM | POA: Diagnosis not present

## 2021-10-03 DIAGNOSIS — K7460 Unspecified cirrhosis of liver: Secondary | ICD-10-CM | POA: Diagnosis not present

## 2021-10-03 DIAGNOSIS — Z79899 Other long term (current) drug therapy: Secondary | ICD-10-CM | POA: Insufficient documentation

## 2021-10-03 DIAGNOSIS — Z803 Family history of malignant neoplasm of breast: Secondary | ICD-10-CM | POA: Insufficient documentation

## 2021-10-03 DIAGNOSIS — D731 Hypersplenism: Secondary | ICD-10-CM | POA: Diagnosis not present

## 2021-10-03 DIAGNOSIS — Z818 Family history of other mental and behavioral disorders: Secondary | ICD-10-CM | POA: Diagnosis not present

## 2021-10-03 DIAGNOSIS — Z820 Family history of epilepsy and other diseases of the nervous system: Secondary | ICD-10-CM | POA: Insufficient documentation

## 2021-10-03 DIAGNOSIS — D696 Thrombocytopenia, unspecified: Secondary | ICD-10-CM

## 2021-10-03 DIAGNOSIS — F17210 Nicotine dependence, cigarettes, uncomplicated: Secondary | ICD-10-CM | POA: Diagnosis not present

## 2021-10-03 DIAGNOSIS — Z8379 Family history of other diseases of the digestive system: Secondary | ICD-10-CM | POA: Diagnosis not present

## 2021-10-03 DIAGNOSIS — Z886 Allergy status to analgesic agent status: Secondary | ICD-10-CM | POA: Insufficient documentation

## 2021-10-03 DIAGNOSIS — M791 Myalgia, unspecified site: Secondary | ICD-10-CM | POA: Insufficient documentation

## 2021-10-03 DIAGNOSIS — Z822 Family history of deafness and hearing loss: Secondary | ICD-10-CM | POA: Insufficient documentation

## 2021-10-03 DIAGNOSIS — Z809 Family history of malignant neoplasm, unspecified: Secondary | ICD-10-CM | POA: Insufficient documentation

## 2021-10-03 DIAGNOSIS — D6959 Other secondary thrombocytopenia: Secondary | ICD-10-CM | POA: Insufficient documentation

## 2021-10-03 LAB — CBC WITH DIFFERENTIAL/PLATELET
Abs Immature Granulocytes: 0.01 10*3/uL (ref 0.00–0.07)
Basophils Absolute: 0 10*3/uL (ref 0.0–0.1)
Basophils Relative: 1 %
Eosinophils Absolute: 0.1 10*3/uL (ref 0.0–0.5)
Eosinophils Relative: 4 %
HCT: 40.1 % (ref 39.0–52.0)
Hemoglobin: 13.9 g/dL (ref 13.0–17.0)
Immature Granulocytes: 0 %
Lymphocytes Relative: 21 %
Lymphs Abs: 0.7 10*3/uL (ref 0.7–4.0)
MCH: 31 pg (ref 26.0–34.0)
MCHC: 34.7 g/dL (ref 30.0–36.0)
MCV: 89.5 fL (ref 80.0–100.0)
Monocytes Absolute: 0.3 10*3/uL (ref 0.1–1.0)
Monocytes Relative: 8 %
Neutro Abs: 2.1 10*3/uL (ref 1.7–7.7)
Neutrophils Relative %: 66 %
Platelets: 67 10*3/uL — ABNORMAL LOW (ref 150–400)
RBC: 4.48 MIL/uL (ref 4.22–5.81)
RDW: 13.9 % (ref 11.5–15.5)
WBC: 3.2 10*3/uL — ABNORMAL LOW (ref 4.0–10.5)
nRBC: 0 % (ref 0.0–0.2)

## 2021-10-03 LAB — CMP (CANCER CENTER ONLY)
ALT: 18 U/L (ref 0–44)
AST: 26 U/L (ref 15–41)
Albumin: 4.1 g/dL (ref 3.5–5.0)
Alkaline Phosphatase: 93 U/L (ref 38–126)
Anion gap: 8 (ref 5–15)
BUN: 10 mg/dL (ref 8–23)
CO2: 21 mmol/L — ABNORMAL LOW (ref 22–32)
Calcium: 9.8 mg/dL (ref 8.9–10.3)
Chloride: 106 mmol/L (ref 98–111)
Creatinine: 0.6 mg/dL — ABNORMAL LOW (ref 0.61–1.24)
GFR, Estimated: >60 mL/min (ref >60)
Glucose, Bld: 100 mg/dL — ABNORMAL HIGH (ref 70–99)
Potassium: 3.8 mmol/L (ref 3.5–5.1)
Sodium: 135 mmol/L (ref 135–145)
Total Bilirubin: 0.9 mg/dL (ref 0.3–1.2)
Total Protein: 8 g/dL (ref 6.5–8.1)

## 2021-10-03 LAB — PROTIME-INR
INR: 1.1 (ref 0.8–1.2)
Prothrombin Time: 13.9 seconds (ref 11.4–15.2)

## 2021-10-03 LAB — IMMATURE PLATELET FRACTION: Immature Platelet Fraction: 2.2 % (ref 1.2–8.6)

## 2021-10-06 ENCOUNTER — Telehealth: Payer: Self-pay | Admitting: Hematology

## 2021-10-06 NOTE — Telephone Encounter (Signed)
Scheduled follow-up appointment per 10/14 los. Patient is aware.

## 2021-10-09 NOTE — Progress Notes (Addendum)
HEMATOLOGY/ONCOLOGY PHONE VISIT NOTE  Date of Service: .10/03/2021   Patient Care Team: Yvonna Alanis, NP as PCP - General (Adult Health Nurse Practitioner) Pieter Partridge, DO as Consulting Physician (Neurology)  CHIEF COMPLAINTS/PURPOSE OF CONSULTATION:  Thrombocytopenia  HISTORY OF PRESENTING ILLNESS:   Francisco [name] is a wonderful 64 y.o. male who has been referred to [name] by Windell Moulding, NP for evaluation and management of thrombocytopenia. The pt reports that he is doing well overall.  The pt reports that he has had low Plt counts since 2018. The pt notes that he sees Dr. Carlean Purl and has done four bandings and nine endoscopies total. The pt has a history of liver cirrhosis and denies any known history of clotting disorder. The pt notes that the Hepatitis C he had was due to a blood transfusion in the 1980s and was undetectable within 5 weeks of the 12 week treatment. The pt notes he quit alcohol in 2015 and they discovered the Hepatitis and cirrhosis in 2016. The pt notes that they believe the alcohol exacerbated the cirrhosis and played a role in this. The pt notes that he was getting ultrasounds of the liver, but has not gotten one recently due to the pandemic. The pt notes that he wishes to get another one sometime soon to observe the changes. The pt notes that he has been on Keppra since Fall 2018. The pt notes that his mother and sister also have epilepsy. The pt notes he takes Advil once a month and Tylenol 3-4x a month, but denies needing them on a regular basis. The pt notes his diet and food intake has been very stable.  The pt notes no recent issues and was referred here due to a routine checkup. The pt note he did not have a PCP prior and this was a part of that visit. The pt notes that he lost 25 pounds within a span of 6 months after he retired. The pt notes that he believes he has been eating better, less stress, and more active.  Lab results 03/27/2021 of CBC w/diff and  CMP is as follows: all values are WNL except for WBC of 3.2K, Plt of 55K.  On review of systems, pt reports intermittent neck muscle pain and denies bleeding issues, decreased appetite, imbalanced diet, bloody stools, sudden weight changes, infection issues, cough, antibiotic usage, changes in bowel habits, and any other symptoms.  INTERVAL HISTORY  Patient is here for follow-up of his thrombocytopenia after his last clinic visit on 04/01/2021.  He notes he is following with his gastroenterologist for his liver cirrhosis.  He notes he also is seeing Dr. Vaughan Browner for possible interstitial lung disease/IPF.  Labs today 10/03/2021 CBC shows platelet count of 67,000 with a normal hemoglobin of 13.9 and WBC count of 3.2k. CMP stable.  Patient notes no overt bleeding issues.  MEDICAL HISTORY:  Past Medical History:  Diagnosis Date   Allergy to [place] pig dander    Arthritis    "hands and knees the most; mild" (03/15/2017)   Benign neoplasm of colon 07/19/2014   07/19/14 - 2 adenomas - repeat colonoscopy 2020    Cirrhosis ([place])    Diverticulosis 2008   Epilepsy ([place])    Esophageal varices with bleeding ([place]) 07/19/2014   Falls related to headaches    Fatty liver 2011   Abd [name]   Headaches thought due to old head injury    Hepatic cirrhosis due to chronic hepatitis C infection ([place])  and some component of alcohol use 07/19/2014   Hepatitis C    History of blood transfusion 1987   "related to the zygomatic arch fracture"   Iron deficiency anemia secondary to blood loss (chronic) 07/19/2014   Portal hypertensive gastropathy ([place]) 07/19/2014   Scoliosis    Torn rotator cuff     SURGICAL HISTORY: Past Surgical History:  Procedure Laterality Date   ABDOMINAL SURGERY     Knife puncture to Stomach   COLONOSCOPY  2008   COLONOSCOPY N/A 07/19/2014   Procedure: COLONOSCOPY;  Surgeon: Gatha Mayer, MD;  Location: [place];  Service: Endoscopy;  Laterality: N/A;   COLONOSCOPY WITH PROPOFOL N/A  06/19/2021   Procedure: COLONOSCOPY WITH PROPOFOL;  Surgeon: Gatha Mayer, MD;  Location: [facility];  Service: Endoscopy;  Laterality: N/A;   ESOPHAGEAL BANDING  04/14/2017   Procedure: ESOPHAGEAL BANDING;  Surgeon: Gatha Mayer, MD;  Location: [facility];  Service: Endoscopy;;   ESOPHAGOGASTRODUODENOSCOPY N/A 07/19/2014   Procedure: ESOPHAGOGASTRODUODENOSCOPY (EGD);  Surgeon: Gatha Mayer, MD;  Location: [facility] ENDOSCOPY;  Service: Endoscopy;  Laterality: N/A;   ESOPHAGOGASTRODUODENOSCOPY N/A 09/05/2014   Procedure: ESOPHAGOGASTRODUODENOSCOPY (EGD);  Surgeon: Gatha Mayer, MD;  Location: [facility];  Service: Endoscopy;  Laterality: N/A;   ESOPHAGOGASTRODUODENOSCOPY N/A 10/17/2014   Procedure: ESOPHAGOGASTRODUODENOSCOPY (EGD);  Surgeon: Gatha Mayer, MD;  Location: [facility];  Service: Endoscopy;  Laterality: N/A;   ESOPHAGOGASTRODUODENOSCOPY N/A 12/19/2014   Procedure: ESOPHAGOGASTRODUODENOSCOPY (EGD);  Surgeon: Gatha Mayer, MD;  Location: [facility];  Service: Endoscopy;  Laterality: N/A;   ESOPHAGOGASTRODUODENOSCOPY N/A 03/16/2017   Procedure: ESOPHAGOGASTRODUODENOSCOPY (EGD);  Surgeon: Ladene Artist, MD;  Location: [facility] ENDOSCOPY;  Service: Endoscopy;  Laterality: N/A;   ESOPHAGOGASTRODUODENOSCOPY (EGD) WITH PROPOFOL N/A 11/21/2015   Procedure: ESOPHAGOGASTRODUODENOSCOPY (EGD) WITH PROPOFOL;  Surgeon: Gatha Mayer, MD;  Location: [place];  Service: Endoscopy;  Laterality: N/A;   ESOPHAGOGASTRODUODENOSCOPY (EGD) WITH PROPOFOL N/A 04/14/2017   Procedure: ESOPHAGOGASTRODUODENOSCOPY (EGD) WITH PROPOFOL;  Surgeon: Gatha Mayer, MD;  Location: [facility];  Service: Endoscopy;  Laterality: N/A;   ESOPHAGOGASTRODUODENOSCOPY (EGD) WITH PROPOFOL N/A 07/23/2017   Procedure: ESOPHAGOGASTRODUODENOSCOPY (EGD) WITH PROPOFOL;  Surgeon: Gatha Mayer, MD;  Location: [facility];  Service: Endoscopy;  Laterality: N/A;   ESOPHAGOGASTRODUODENOSCOPY (EGD) WITH PROPOFOL N/A 06/19/2021    Procedure: ESOPHAGOGASTRODUODENOSCOPY (EGD) WITH PROPOFOL;  Surgeon: Gatha Mayer, MD;  Location: [facility];  Service: Endoscopy;  Laterality: N/A;   FRACTURE SURGERY Right    cheek   GASTRIC VARICES BANDING N/A 10/17/2014   Procedure: GASTRIC VARICES BANDING;  Surgeon: Gatha Mayer, MD;  Location: [facility];  Service: Endoscopy;  Laterality: N/A;   ORIF ZYGOMATIC FRACTURE Right 1987   RADIUS SYNOSTOSIS PROXIMAL RESECTION W/ ALLOGRAFT Right 1980s   at the elbow   THYROIDECTOMY, PARTIAL Left 2012    SOCIAL HISTORY: Social History   Socioeconomic History   Marital status: Divorced    Spouse name: Not on file   Number of children: 1   Years of education: some college   Highest education level: Not on file  Occupational History   Occupation: Scientist, 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 Lives with his dog in a 1 story home Father and his dog moved 2020.    Right handed   One story home         Marital status:   Divorced                            What year were you married ? 1990      Highest Level of education completed: 2/1/2 Years college      Current or past profession: Health and safety inspector       Do you exercise?   Yes                           Type & how often Walk every day  ( 1/2-2 miles)  [employer] work      ADVANCED DIRECTIVES (Please bring copies)      Do you have a living will? Yes      Do you have a DNR form?   Yes                    If not, do you want to discuss one?       Do you have signed POA?HPOA forms? Yes                If so, please bring to your appointment      FUNCTIONAL STATUS- To be completed by Spouse / child / Staff       Do you have difficulty bathing or dressing yourself ?  No      Do you have difficulty preparing food or eating ?  No      Do you have difficulty managing your mediation ?  No      Do you have difficulty managing your finances ?  No      Do you have difficulty affording your medication ?  No      Social Determinants of Radio broadcast assistant Strain: Not on file  Food Insecurity: Not on file  Transportation Needs: Not on file  Physical Activity: Not on file  Stress: Not on file  Social Connections: Not on file  Intimate Partner Violence: Not on file    FAMILY HISTORY: Family History  Problem Relation Age of Onset   Breast cancer Mother 75   Crohn's disease Father 44   Arthritis/Rheumatoid Father    Hearing loss Father    Epilepsy Sister 75   Ankylosing spondylitis Brother 34   Heart disease Maternal Grandmother    Alcohol abuse Maternal Grandfather    Cancer Paternal Grandfather        type unknown, mets   Alcohol abuse Maternal Uncle        several   Depression Daughter 79    ALLERGIES:  is allergic to naproxen and nsaids.  MEDICATIONS:  Current Outpatient Medications  Medication Sig Dispense Refill  acetaminophen (TYLENOL) 500 MG tablet Take 500 mg by mouth every 4 (four) hours as needed for moderate pain or mild pain.     albuterol (VENTOLIN HFA) 108 (90 Base) MCG/ACT inhaler Inhale 2 puffs into the lungs every 6 (six) hours as needed for wheezing or shortness of breath. 18 g 0   levETIRAcetam (KEPPRA) 500 MG tablet Take 1 tablet (500 mg total) by mouth 2 (two) times daily.     Multiple  Vitamins-Minerals (MULTIVITAMIN WITH MINERALS) tablet Take 1 tablet by mouth daily.     No current facility-administered medications for this visit.    REVIEW OF SYSTEMS:   .10 Point review of Systems was done is negative except as noted above.  PHYSICAL EXAMINATION: ECOG PERFORMANCE STATUS: 1 - Symptomatic but completely ambulatory  . Vitals:   10/03/21 1252  BP: 106/63  Pulse: 61  Resp: 17  Temp: 97.9 F (36.6 C)  SpO2: 98%   Filed Weights   10/03/21 1252  Weight: 205 lb 11.2 oz (93.3 kg)   .Body mass index is 27.9 kg/m.  [name] GENERAL:alert, in no acute distress and comfortable SKIN: no acute rashes, no significant lesions EYES: conjunctiva are pink and non-injected, sclera anicteric OROPHARYNX: MMM, no exudates, no oropharyngeal erythema or ulceration NECK: supple, no JVD LYMPH:  no palpable lymphadenopathy in the cervical, axillary or inguinal regions LUNGS: clear to auscultation b/l with normal respiratory effort HEART: regular rate & rhythm ABDOMEN:  normoactive bowel sounds , non tender, not distended. Extremity: no pedal edema PSYCH: alert & oriented x 3 with fluent speech NEURO: no focal motor/sensory deficits   LABORATORY DATA:  I have reviewed the data as listed  . CBC Latest Ref Rng & Units 10/03/2021 04/01/2021 04/01/2021  WBC 4.0 - 10.5 K/uL 3.2(L) 3.8(L) -  Hemoglobin 13.0 - 17.0 g/dL 13.9 14.5 -  Hematocrit 39.0 - 52.0 % 40.1 41.5 42.6  Platelets 150 - 400 K/uL 67(L) 62(L) -    . CMP Latest Ref Rng & Units 10/03/2021 03/25/2021 07/06/2017  Glucose 70 - 99 mg/dL 100(H) 93 99  BUN 8 - 23 mg/dL 10 13 11   Creatinine 0.61 - 1.24 mg/dL 0.60(L) 0.59(L) 0.76  Sodium 135 - 145 mmol/L 135 138 137  Potassium 3.5 - 5.1 mmol/L 3.8 3.8 4.1  Chloride 98 - 111 mmol/L 106 108 106  CO2 22 - 32 mmol/L 21(L) 22 24  Calcium 8.9 - 10.3 mg/dL 9.8 9.7 11.0(H)  Total Protein 6.5 - 8.1 g/dL 8.0 7.5 8.3  Total Bilirubin 0.3 - 1.2 mg/dL 0.9 0.8 0.5  Alkaline Phos 38 -  126 U/L 93 - 110  AST 15 - 41 U/L 26 28 23   ALT 0 - 44 U/L 18 23 27    Component     Latest Ref Rng & Units 04/01/2021  Folate, Hemolysate     Not Estab. ng/mL 362.0  HCT     37.5 - 51.0 % 42.6  Folate, RBC     >498 ng/mL 850  Prothrombin Time     11.4 - 15.2 seconds 14.8  INR     0.8 - 1.2 1.2  Vitamin B12     180 - 914 pg/mL 221  Ferritin     24 - 336 ng/mL 129  Fibrinogen     210 - 475 mg/dL 357    RADIOGRAPHIC STUDIES: I have personally reviewed the radiological images as listed and agreed with the findings in the report.  [name] abd 05/26/2021: IMPRESSION: Changes of cirrhosis of  the liver without focal mass.   Cholelithiasis without complicating factors.   Changes consistent with splenomegaly.     Electronically Signed   By: Inez Catalina M.D.   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 -labs reviewed with patient PLT count stable @ 67k.  -Recommended pt f/u w Dr. Carlean Purl regarding liver cirrhosis and Hepatitis C remission status and continued monitoring for [facility]. -Recommended pt avoid all NSAIDs outside of Tylenol to reduce bleeding risk. -Recommended pt take a Vitamin B-Complex daily. -Will see back in 12 months with labs.   FOLLOW UP: RTC with Dr Irene Limbo with labs in 12 months   All of the patients questions were answered with apparent satisfaction. The patient knows to call the clinic with any problems, questions or concerns.  I spent 40 minutes counseling the patient face to face. The total time spent in the appointment was 50 minutes and more than 50% was on counseling and direct patient cares.    Sullivan Lone MD [place] AAHIVMS [facility] Hematology/Oncology Physician [facility]  (Office):       [phone] (Work cell):  [phone] (Fax):            [phone]  10/12/2021 7:03 AM  I, Reinaldo Raddle, am acting as scribe for Dr. Sullivan Lone, MD.   .I have reviewed the above documentation for accuracy and completeness, and I agree with the above. Brunetta Genera MD .

## 2021-11-11 ENCOUNTER — Telehealth: Payer: Self-pay | Admitting: Pulmonary Disease

## 2021-11-11 NOTE — Telephone Encounter (Signed)
[name] to schedule PFT and follow up with Dr. Vaughan Browner.  LM to call office to schedule.    Staff message received from Dr. Vaughan Browner-  Marshell Garfinkel, MD  Elton Sin, LPN Patient needs PFTs scheduled and follow up in clinic. Please schedule   Francisco [name]

## 2021-11-12 NOTE — Telephone Encounter (Signed)
[name].  LM to call back to schedule PFT and follow up appointment with Dr. Vaughan Browner.

## 2021-11-17 NOTE — Telephone Encounter (Signed)
Patient scheduled 01/08/22 for PFT and follow up OV with Dr. Vaughan Browner.  Nothing further at this time.

## 2021-12-01 ENCOUNTER — Other Ambulatory Visit: Payer: Self-pay

## 2021-12-01 ENCOUNTER — Telehealth: Payer: Self-pay

## 2021-12-01 ENCOUNTER — Ambulatory Visit (HOSPITAL_COMMUNITY): Payer: Medicare Other

## 2021-12-01 DIAGNOSIS — B182 Chronic viral hepatitis C: Secondary | ICD-10-CM

## 2021-12-01 NOTE — Telephone Encounter (Signed)
-----   Message from [name], RN sent at 12/01/2021 11:20 AM EST ----- Dr. Carlean Purl patient needs a [name].   Thx   Sheri ----- Message ----- From: [name], RN Sent: 11/29/2021  12:00 AM EST To: [name], RN  Needs [name] see results 05/30/21 Carlean Purl

## 2021-12-01 NOTE — Telephone Encounter (Signed)
Pt scheduled for an US abdomen Complete on 12/11/2021  at 9:00 at [facility]. Pt to arrive at 8:45. Pt to be NPO past midnight. Pt made aware.  Pt verbalized understanding with all questions answered.

## 2021-12-11 ENCOUNTER — Ambulatory Visit (HOSPITAL_COMMUNITY)
Admission: RE | Admit: 2021-12-11 | Discharge: 2021-12-11 | Disposition: A | Discharge status: Home / Self Care | Payer: Medicare Other | Source: Ambulatory Visit | Attending: Internal Medicine | Admitting: Internal Medicine

## 2021-12-11 ENCOUNTER — Other Ambulatory Visit: Payer: Self-pay

## 2021-12-11 DIAGNOSIS — K7460 Unspecified cirrhosis of liver: Secondary | ICD-10-CM | POA: Diagnosis present

## 2021-12-11 DIAGNOSIS — B182 Chronic viral hepatitis C: Secondary | ICD-10-CM | POA: Insufficient documentation

## 2022-01-08 ENCOUNTER — Ambulatory Visit (INDEPENDENT_AMBULATORY_CARE_PROVIDER_SITE_OTHER): Payer: Medicare Other | Admitting: Pulmonary Disease

## 2022-01-08 ENCOUNTER — Other Ambulatory Visit: Payer: Self-pay

## 2022-01-08 ENCOUNTER — Encounter: Payer: Self-pay | Admitting: Pulmonary Disease

## 2022-01-08 ENCOUNTER — Ambulatory Visit: Payer: Medicare Other | Admitting: Pulmonary Disease

## 2022-01-08 VITALS — BP 128/66 | HR 60 | Temp 97.9°F | Ht 72.0 in | Wt 200.0 lb

## 2022-01-08 DIAGNOSIS — J849 Interstitial pulmonary disease, unspecified: Secondary | ICD-10-CM

## 2022-01-08 LAB — PULMONARY FUNCTION TEST
DL/VA % pred: 65 %
DL/VA: 2.69 ml/min/mmHg/L
DLCO cor % pred: 53 %
DLCO cor: 15.19 ml/min/mmHg
DLCO unc % pred: 53 %
DLCO unc: 15.19 ml/min/mmHg
FEF 25-75 Post: 3.91 L/sec
FEF 25-75 Pre: 3.39 L/sec
FEF2575-%Change-Post: 15 %
FEF2575-%Pred-Post: 134 %
FEF2575-%Pred-Pre: 116 %
FEV1-%Change-Post: 2 %
FEV1-%Pred-Post: 98 %
FEV1-%Pred-Pre: 96 %
FEV1-Post: 3.66 L
FEV1-Pre: 3.58 L
FEV1FVC-%Change-Post: 3 %
FEV1FVC-%Pred-Pre: 105 %
FEV6-%Change-Post: 0 %
FEV6-%Pred-Post: 95 %
FEV6-%Pred-Pre: 95 %
FEV6-Post: 4.49 L
FEV6-Pre: 4.51 L
FEV6FVC-%Change-Post: 0 %
FEV6FVC-%Pred-Post: 105 %
FEV6FVC-%Pred-Pre: 104 %
FVC-%Change-Post: -1 %
FVC-%Pred-Post: 90 %
FVC-%Pred-Pre: 91 %
FVC-Post: 4.49 L
FVC-Pre: 4.55 L
Post FEV1/FVC ratio: 81 %
Post FEV6/FVC ratio: 100 %
Pre FEV1/FVC ratio: 79 %
Pre FEV6/FVC Ratio: 99 %
RV % pred: 64 %
RV: 1.57 L
TLC % pred: 79 %
TLC: 5.92 L

## 2022-01-08 MED ORDER — TRELEGY ELLIPTA 200-62.5-25 MCG/ACT IN AEPB: 1.0000 | INHALATION_SPRAY | Freq: Every day | RESPIRATORY_TRACT | 0 refills | Status: DC

## 2022-01-08 NOTE — Progress Notes (Signed)
Full PFT performed today. °

## 2022-01-08 NOTE — Patient Instructions (Signed)
Full PFT performed today. °

## 2022-01-08 NOTE — Progress Notes (Signed)
Francisco [name]    [id number]    [birth year]  Primary Care Physician:[place], Amy E, NP  Referring Physician: Yvonna Alanis, NP [address],  [place] [address]  Chief complaint: Follow-up for interstitial lung disease  HPI: 66 year old with history of allergies, hepatitis C, cirrhosis referred for evaluation of abnormal finding seen on CT scan. Screening CT in early 2022 showed mild subpleural reticulation.  Follow-up high-resolution CT in July 2022 showed pulmonary fibrosis with honeycombing and has been referred here for further evaluation next Has complaints of chest congestion, no dyspnea, has small joint pain, stiffness Has strong family history of CTD with rheumatoid arthritis in father and ankylosing spondylitis and brother  He has history of cirrhosis secondary to hepatitis C which was treated with Harvoni.  Pets: No pets Occupation: Worked as a Freight forwarder for a [employer], community.  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  No mold, hot tub, Jacuzzi.  No feather pillows or comforters.  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  Follow-up was delayed last year as he developed COVID before his appointment  Case was discussed at multidisciplinary conference on 09/02/2021 and CT findings was consistent with UIP fibrosis States that he has dyspnea on exertion which is unchanged from baseline  Outpatient Encounter Medications as of 01/08/2022  Medication Sig   acetaminophen (TYLENOL) 500 MG tablet Take 500 mg by mouth every 4 (four) hours as needed for moderate pain or mild pain.   albuterol (VENTOLIN HFA)  108 (90 Base) MCG/ACT inhaler Inhale 2 puffs into the lungs every 6 (six) hours as needed for wheezing or shortness of breath.   levETIRAcetam (KEPPRA) 500 MG tablet Take 1 tablet (500 mg total) by mouth 2 (two) times daily.   Multiple Vitamins-Minerals (MULTIVITAMIN WITH MINERALS) tablet Take 1 tablet by mouth daily.   No facility-administered encounter medications on file as of 01/08/2022.    Physical Exam: Blood pressure 128/66, pulse 60, temperature 97.9 F (36.6 C), temperature source Oral, height 6' (1.829 m), weight 200 lb (90.7 kg), SpO2 96 %. Gen:      No acute distress HEENT:  EOMI, sclera anicteric Neck:     No masses; no thyromegaly Lungs:    Bibasal crackles CV:         Regular rate and rhythm; no murmurs Abd:      + bowel sounds; soft, non-tender; no palpable masses, no distension Ext:    Clubbing; adequate peripheral perfusion Skin:      Warm and dry; no rash Neuro: alert and oriented x 3 Psych: normal mood and affect   Data Reviewed: Imaging: CT lung cancer screening 04/05/2021-subpleural reticulation, calcified granuloma  High-resolution CT 06/27/2021-mild groundglass attenuation, subpleural reticulation with honeycombing most evident at the anterior aspect of the upper lobe, advanced cirrhosis I have reviewed the images personally  PFTs: 01/08/2022 FVC 4.49 [90%], FEV1 3.66 [98%], F/F 81, TLC 5.92 [79%], DLCO 15.19 [53%] Minimal restriction, moderate-severe diffusion defect  Labs: CTD serologies 08/18/2021-ANA 1:80, cytoplasmic, rheumatoid factor 16, and anti Jo 1-20  Assessment:  Follow-up for interstitial lung disease Case reviewed at multidisciplinary conference in September 2022 CT shows moderate patchy reticulation with traction bronchiectasis,  honeycombing with basilar predominance Although there is increased disease in the anterior aspect of the upper lobe this process is felt to be more bullous emphysema and not honeycombing.  Pattern is still consistent  with UIP by ATS criteria and this could be IPF in the right clinical setting especially given significant clubbing  He does have strong family history of connective tissue disease with positive ANA, rheumatoid factor and Jo 1 No significant exposures noted  Differential diagnosis at this point is IPF versus connective tissue disease associated ILD Irrespective of the final diagnosis he would need to be on antifibrotic as he has honeycombing with risk of progression We will make referral to rheumatology for evaluation of abnormal labs  Emphysema He has significant emphysema on CT scan though no obstruction on spirometry.  Suspect that he does not have obstruction due to combined presence of emphysema and pulmonary fibrosis Trial Trelegy inhaler to see if it helps his breathing  Plan/Recommendations: Start Esbriet Rheumatology evaluation Trelegy sample  Marshell Garfinkel MD [facility] 01/08/2022, 4:20 PM  CC: Yvonna Alanis, NP

## 2022-01-08 NOTE — Patient Instructions (Signed)
We will refer you to rheumatology for better evaluation of her abnormal labs We will start you on a medication called Esbriet We will give you a sample of an inhaler called Trelegy.  If you like this then we can call in a prescription Return to clinic in 2 months

## 2022-01-13 ENCOUNTER — Telehealth: Payer: Self-pay

## 2022-01-13 ENCOUNTER — Telehealth: Payer: Self-pay | Admitting: Pulmonary Disease

## 2022-01-13 NOTE — Telephone Encounter (Signed)
Received New start paperwork for ESBRIET. Will update as we work through the benefits process.  Submitted a Prior Authorization request to [facility] for PIRFENIDONE via CoverMyMeds. Will update once we receive a response.   Key: [id number]

## 2022-01-14 ENCOUNTER — Other Ambulatory Visit (HOSPITAL_COMMUNITY): Payer: Self-pay

## 2022-01-14 NOTE — Telephone Encounter (Signed)
Patient's prior authorization for pirfenidone is already approved. Closing this encounter  Knox Saliva, PharmD, MPH, BCPS Clinical Pharmacist (Rheumatology and Pulmonology)

## 2022-01-14 NOTE — Telephone Encounter (Signed)
Received notification from [facility] regarding a prior authorization for PIRFENIDONE. Authorization has been APPROVED from 01/13/2022 to 12/20/2022.   Per test claim, copay for 180 tablets as a 30 days supply (plan allows maximum of 6 tablets per day) is $286.51  Patient can fill through [facility]: [phone]   Authorization # [id number]   Submitted Patient Assistance Application to [facility] for PIRFENIDONE along with provider portion, PA and income documents. Will update patient when we receive a response.  Fax# [phone] Phone# [phone]

## 2022-01-15 ENCOUNTER — Ambulatory Visit: Payer: Self-pay | Admitting: Neurology
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
## 2022-01-16 ENCOUNTER — Encounter: Payer: Self-pay | Admitting: Neurology

## 2022-01-16 ENCOUNTER — Ambulatory Visit (INDEPENDENT_AMBULATORY_CARE_PROVIDER_SITE_OTHER): Payer: Medicare Other | Admitting: Neurology

## 2022-01-16 ENCOUNTER — Other Ambulatory Visit: Payer: Self-pay

## 2022-01-16 VITALS — BP 122/76 | HR 83 | Ht 72.0 in | Wt 198.4 lb

## 2022-01-16 DIAGNOSIS — R4589 Other symptoms and signs involving emotional state: Secondary | ICD-10-CM

## 2022-01-16 DIAGNOSIS — G40309 Generalized idiopathic epilepsy and epileptic syndromes, not intractable, without status epilepticus: Secondary | ICD-10-CM | POA: Diagnosis not present

## 2022-01-16 MED ORDER — LAMOTRIGINE 25 MG PO TABS: ORAL_TABLET | ORAL | 0 refills | Status: DC

## 2022-01-16 NOTE — Patient Instructions (Addendum)
°  Continue levetiracetam 500mg  twice daily for now Start lamotrigine 25mg  tablet:    AM   PM Week 1&2    25mg  (1 tab) Week 3&4 25mg  (1 tab)  25mg  (1 tab) Week 5 50mg  (2 tabs)  50mg  (2 tabs)  At the beginning of week 6, contact me and we can discontinue levetiracetam. If you have seizures, we will continue increasing lamotrigine  2.  Refer to [facility] - [phone] 3.  Follow up in 6 months

## 2022-01-19 NOTE — Addendum Note (Signed)
Addended by: Venetia Night on: 01/19/2022 01:06 PM   Modules accepted: Orders

## 2022-01-21 ENCOUNTER — Encounter: Payer: Self-pay | Admitting: Internal Medicine

## 2022-01-21 ENCOUNTER — Ambulatory Visit: Payer: Medicare Other | Admitting: Internal Medicine

## 2022-01-21 ENCOUNTER — Other Ambulatory Visit: Payer: Medicare Other

## 2022-01-21 VITALS — BP 104/58 | HR 54 | Ht 72.0 in | Wt 201.0 lb

## 2022-01-21 DIAGNOSIS — B182 Chronic viral hepatitis C: Secondary | ICD-10-CM

## 2022-01-21 DIAGNOSIS — I8511 Secondary esophageal varices with bleeding: Secondary | ICD-10-CM

## 2022-01-21 DIAGNOSIS — K429 Umbilical hernia without obstruction or gangrene: Secondary | ICD-10-CM | POA: Diagnosis not present

## 2022-01-21 DIAGNOSIS — K7460 Unspecified cirrhosis of liver: Secondary | ICD-10-CM

## 2022-01-21 DIAGNOSIS — K3189 Other diseases of stomach and duodenum: Secondary | ICD-10-CM

## 2022-01-21 DIAGNOSIS — K766 Portal hypertension: Secondary | ICD-10-CM

## 2022-01-21 DIAGNOSIS — D696 Thrombocytopenia, unspecified: Secondary | ICD-10-CM | POA: Insufficient documentation
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
## 2022-01-21 NOTE — Patient Instructions (Addendum)
Your provider has requested that you go to the basement level for lab work before leaving today. Press "B" on the elevator. The lab is located at the first door on the left as you exit the elevator.  The [facility] providers would like to encourage you to use [facility] to communicate with providers for non-urgent requests or questions.  Due to long hold times on the telephone, sending your provider a message by [facility] may be a faster and more efficient way to get a response.  Please allow 48 business hours for a response.  Please remember that this is for non-urgent requests.    Due to recent changes in healthcare laws, you may see the results of your imaging and laboratory studies on MyChart before your provider has had a chance to review them.  We understand that in some cases there may be results that are confusing or concerning to you. Not all laboratory results come back in the same time frame and the provider may be waiting for multiple results in order to interpret others.  Please give [name] 48 hours in order for your provider to thoroughly review all the results before contacting the office for clarification of your results.   I appreciate the opportunity to care for you. Silvano Rusk, MD, [facility]

## 2022-01-22 LAB — AFP TUMOR MARKER: AFP-Tumor Marker: 3.7 ng/mL (ref <6.1)

## 2022-01-29 NOTE — Telephone Encounter (Signed)
Francisco [name] from [facility] checking to see if patient offered any copay assistance. Francisco [name] phone number is [phone] option 5.

## 2022-02-02 ENCOUNTER — Other Ambulatory Visit: Payer: Self-pay | Admitting: Neurology

## 2022-02-04 ENCOUNTER — Encounter: Payer: Self-pay | Admitting: Pulmonary Disease

## 2022-02-05 NOTE — Telephone Encounter (Signed)
Received a Mychart message from patient asking about the [place] and why he has not been able to pick up this medication from the pharmacy. He is now aware that it will come from a specialty pharmacy.   Pharmacy team, do we have any updates on the [place]? Thanks!

## 2022-02-06 NOTE — Telephone Encounter (Signed)
Routing for clinical f/u, however pt states that he has no major questions and/or concerns at this time. He is aware of common side effects and need for routine labs and office visits on around a ~6 month schedule.

## 2022-02-06 NOTE — Telephone Encounter (Signed)
Received a fax from [place] regarding an approval for Pirfenidone patient assistance from 02/06/2022 until patient no longer qualifies due to discontinuation of therapy, changes to patient's current financial status or health insurance and/or patient no longer meets the program eligibility requirements. Called pt and provided update and next steps, phone numbers have been provided. Encouraged pt to reach out if she has any additional questions or concerns. Pt verbalized understanding to all. Relevant Documents have been sent to scan center.   [employer] Phone#: [phone] option 5 [employer] Phone#: [phone]

## 2022-02-09 ENCOUNTER — Telehealth: Payer: Self-pay | Admitting: Internal Medicine

## 2022-02-09 ENCOUNTER — Other Ambulatory Visit: Payer: Self-pay | Admitting: Neurology

## 2022-02-09 NOTE — Telephone Encounter (Signed)
Inbound call from patient would like a call back to discuss the medication Esbriet. States it could cause a problem with his liver and that is what he is being treated for

## 2022-02-09 NOTE — Telephone Encounter (Signed)
Pt recently has been prescribed Esbriet by his Pulmonologist for Pulmonary Fibrosis: Pt states that he has done research on this Medication and it states that it is harmful to the Liver and pt states that his liver  is already compromised. Pt is wanting to make Dr. Carlean Purl aware and seek advise if this Medication is safe for him to take.  Please advise.

## 2022-02-10 ENCOUNTER — Telehealth: Payer: Self-pay | Admitting: Pharmacist

## 2022-02-10 DIAGNOSIS — Z5181 Encounter for therapeutic drug level monitoring: Secondary | ICD-10-CM

## 2022-02-10 MED ORDER — PIRFENIDONE 267 MG PO TABS: ORAL_TABLET | ORAL | 0 refills | Status: DC

## 2022-02-10 MED ORDER — TRELEGY ELLIPTA 200-62.5-25 MCG/ACT IN AEPB: 1.0000 | INHALATION_SPRAY | Freq: Every day | RESPIRATORY_TRACT | 5 refills | Status: DC

## 2022-02-10 NOTE — Telephone Encounter (Signed)
Subjective:  Patient called today by [facility] pharmacy team for Esbriet new start. Patient was last seen and referred by Dr. Vaughan Browner on 01/08/2022. Pertinent past medical history includes cirrhosis, hx of hep C treated with Harvoni, alcoholism, and epilepsy. Patient has family hx of connective tissue disease and will be seeing rheumatology on 2/27 - differential diagnosis of IPF vs. CTD-related ILD.  Pt specifically concerned about interaction with cirrhosis and he reached out to GI to get OK to start Esbriet. Pt also concerned about diagnosis not aligning with FDA-approved indication for IPF only. He was never told that he has IPF.   History of elevated LFTs: No, last elevated in 2015  History of diarrhea, nausea, vomiting: No  Objective: Allergies  Allergen Reactions   Naproxen Other (See Comments)    Esophageal bleeding   Nsaids     Avoid due to liver condition     Outpatient Encounter Medications as of 02/10/2022  Medication Sig Note   acetaminophen (TYLENOL) 500 MG tablet Take 500 mg by mouth every 4 (four) hours as needed for moderate pain or mild pain.    albuterol (VENTOLIN HFA) 108 (90 Base) MCG/ACT inhaler Inhale 2 puffs into the lungs every 6 (six) hours as needed for wheezing or shortness of breath. 06/12/2021: Ran out   Fluticasone-Umeclidin-Vilant (TRELEGY ELLIPTA) 200-62.5-25 MCG/ACT AEPB Inhale 1 puff into the lungs daily.    lamoTRIgine (LAMICTAL) 25 MG tablet Take 1 tablet at bedtime for 14 days, then 1 tablet twice daily for 14 days, then 2 tablets twice daily    levETIRAcetam (KEPPRA) 500 MG tablet Take 1 tablet (500 mg total) by mouth 2 (two) times daily.    Multiple Vitamins-Minerals (MULTIVITAMIN WITH MINERALS) tablet Take 1 tablet by mouth daily.    No facility-administered encounter medications on file as of 02/10/2022.     Immunization History  Administered Date(s) Administered   Hep A / Hep B 09/12/2014, 10/12/2014, 03/14/2015   Influenza,inj,Quad  PF,6+ Mos 10/09/2014   PFIZER(Purple Top)SARS-COV-2 Vaccination 03/03/2020, 03/25/2020, 12/04/2020   Pneumococcal Conjugate-13 07/06/2017   Pneumococcal Polysaccharide-23 10/09/2014      PFT's TLC  Date Value Ref Range Status  01/08/2022 5.92 L Final      CMP     Component Value Date/Time   NA 135 10/03/2021 1230   K 3.8 10/03/2021 1230   CL 106 10/03/2021 1230   CO2 21 (L) 10/03/2021 1230   GLUCOSE 100 (H) 10/03/2021 1230   BUN 10 10/03/2021 1230   CREATININE 0.60 (L) 10/03/2021 1230   CREATININE 0.59 (L) 03/25/2021 0829   CALCIUM 9.8 10/03/2021 1230   PROT 8.0 10/03/2021 1230   ALBUMIN 4.1 10/03/2021 1230   AST 26 10/03/2021 1230   ALT 18 10/03/2021 1230   ALKPHOS 93 10/03/2021 1230   BILITOT 0.9 10/03/2021 1230   GFRNONAA >60 10/03/2021 1230   GFRNONAA >89 11/21/2014 1552   GFRAA >60 03/16/2017 0648   GFRAA >89 11/21/2014 1552      CBC    Component Value Date/Time   WBC 3.2 (L) 10/03/2021 1230   RBC 4.48 10/03/2021 1230   HGB 13.9 10/03/2021 1230   HCT 40.1 10/03/2021 1230   HCT 42.6 04/01/2021 1409   PLT 67 (L) 10/03/2021 1230   MCV 89.5 10/03/2021 1230   MCV 102.7 (A) 05/08/2014 1331   MCH 31.0 10/03/2021 1230   MCHC 34.7 10/03/2021 1230   RDW 13.9 10/03/2021 1230   LYMPHSABS 0.7 10/03/2021 1230   MONOABS 0.3 10/03/2021  1230   EOSABS 0.1 10/03/2021 1230   BASOSABS 0.0 10/03/2021 1230      LFT's Hepatic Function Latest Ref Rng & Units 10/03/2021 03/25/2021 07/06/2017  Total Protein 6.5 - 8.1 g/dL 8.0 7.5 8.3  Albumin 3.5 - 5.0 g/dL 4.1 - 4.3  AST 15 - 41 U/L _0 ALT 0 - 44 U/L _1 Alk Phosphatase 38 - 126 U/L 93 - 110  Total Bilirubin 0.3 - 1.2 mg/dL 0.9 0.8 0.5  Bilirubin, Direct 0.0 - 0.2 mg/dL - 0.2 -      HRCT (06/27/2021) --  IMPRESSION: 1. The appearance of the lungs is categorized as compatible with interstitial lung disease, and technically categorized as compatible with usual interstitial pneumonia (UIP). Distribution of  fibrosis is somewhat unusual for UIP given the lack of craniocaudal gradient, however, given absence of air trapping, findings are not favored to reflect chronic hypersensitivity pneumonitis at this time.  Assessment and Plan  Esbriet Medication Management Thoroughly counseled patient on the efficacy, mechanism of action, dosing, administration, adverse effects, and monitoring parameters of Esbriet.  Patient verbalized understanding. Patient education handout provided.   Reviewed that his cirrhosis and hepatic panel are stable. Advised patient that Dr. Vaughan Browner is aware of his hepatic history. Reviewed that Esbriet is not contraindicated but will closely monitor hepatic panel and interrupt/adjust dosing as needed. Reviewed that Jacklynn Bue has been studied in small populations with non-IPF ILD and has shown some clinical benefit. He verbalized understanding and feels more comfortable with starting therapy.   Goals of Therapy: Will not stop or reverse the progression of ILD. It will slow the progression of ILD.   Dosing: Starting dose will be Esbriet 267 mg 1 tablet three times daily for 7 days, then 2 tablets three times daily for 7 days, then 3 tablets three times daily.  Maintenance dose will be 801 mg 1 tablet three times daily if tolerated.  Stressed the importance of taking with meals and space at least 5-6 hours apart to minimize stomach upset.   Adverse Effects: Nausea, vomiting, diarrhea, weight loss Abdominal pain GERD Sun sensitivity/rash - patient advised to wear sunscreen when exposed to sunlight Dizziness Fatigue  Monitoring: Monitor for diarrhea, nausea and vomiting, GI perforation, hepatotoxicity  Monitor LFTs - baseline, monthly for first 6 months, then every 3 months routinely CBC w differential at baseline and every 3 months routinely  Access: Approval of Esbriet through: patient assistance Rx sent to: [facility] [facility], dispensing) for [place]: [phone] Pt spoke with  [employer] to complete welcome call on 02/06/2022, has not scheduled shipment. Advised pt to reach out to pharmacy if he has not received call by 02/13/2022. Pt confirmed he has pharmacy phone number.  Medication Reconciliation A drug regimen assessment was performed, including review of allergies, interactions, disease-state management, dosing and immunization history. Medications were reviewed with the patient, including name, instructions, indication, goals of therapy, potential side effects, importance of adherence, and safe use.  Immunizations Patient has received 3 COVID19 vaccines. Pt UTD on pneumonia vaccine and is eligible for zoster vaccine.   This appointment required 20 minutes of patient care (this includes precharting, chart review, review of results, face-to-face care, etc.).  Thank you for involving pharmacy to assist in providing this patient's care.   Dorian Furnace, MBA PharmD Candidate  [facility], [place] of 2023

## 2022-02-10 NOTE — Telephone Encounter (Signed)
Pt made aware of Dr. Gessner recommendations: Pt verbalized understanding with all questions answered.   

## 2022-02-10 NOTE — Telephone Encounter (Signed)
It is ok for him the doctors need to monitor for deterioriation.  He should also discuss with pulmonary

## 2022-02-10 NOTE — Telephone Encounter (Signed)
Counseled patient in separate telephone encounter  Knox Saliva, PharmD, MPH, BCPS Clinical Pharmacist (Rheumatology and Pulmonology)

## 2022-02-15 NOTE — Progress Notes (Signed)
Office Visit Note  Patient: Francisco [name]             Date of Birth: 01-Nov-1956           MRN: [id number]             PCP: Yvonna Alanis, NP Referring: Marshell Garfinkel, MD Visit Date: 02/16/2022 Occupation: Retired Architect, current parent caregiver  Subjective:  New Patient (Initial Visit) (Abnormal labs, CT results)   History of Present Illness: Francisco [name] is a 66 y.o. male here for evaluation of positive ANA checked in association with ILD. He has a history of liver cirrhosis secondary to hep C now s/p harvoni treatment. He has a family history of rheumatoid arthritis and ankylosing spondylitis, no personal known autoimmune disease history. He is a long term smoker and has a daily mildly productive cough that is not new. He was made aware of digital clubbing changes starting about 2 years ago. He does not have significant shortness of breath and he walks regularly. He was diagnosed just in the past year ILD with UIP pattern planning starting antifibrotic therapy thought to represent atypical pattern of IPF or questionable CTD related ILD. He has right shoulder pain since injury about 30 years ago this sometimes causes pain radiating towards the elbow worse with increased use. He does not have night time pain on that area. He does not noticed any swelling or numbness in the arms. He has minimal pedal edema notices slight sock indentations. He denies oral ulcers, lymphadneopathy, fevers, or raynaud's symptoms.   Labs reviewed ANA 1:80 cytoplasmic Scl-70, SSA, SSB neg RF 16 CCP neg ANCAs neg Jo-1 20 rest myomarker panel neg  Activities of Daily Living:  Patient reports morning stiffness for 0  none .   Patient Denies nocturnal pain.  Difficulty dressing/grooming: Denies Difficulty climbing stairs: Denies Difficulty getting out of chair: Denies Difficulty using hands for taps, buttons, cutlery, and/or writing: Denies  Review of Systems  Constitutional:  Negative for  fatigue.  HENT:  Negative for mouth dryness.   Eyes:  Negative for dryness.  Respiratory:  Negative for shortness of breath.   Cardiovascular:  Negative for swelling in legs/feet.  Gastrointestinal:  Negative for constipation.  Endocrine: Negative for excessive thirst.  Genitourinary:  Negative for difficulty urinating.  Musculoskeletal:  Positive for joint pain and joint pain.  Skin:  Negative for rash.  Allergic/Immunologic: Negative for susceptible to infections.  Neurological:  Positive for seizures.  Hematological:  Negative for bruising/bleeding tendency.  Psychiatric/Behavioral:  Negative for sleep disturbance.    PMFS History:  Patient Active Problem List   Diagnosis Date Noted   Idiopathic pulmonary fibrosis ([place]) 02/16/2022   Positive ANA (antinuclear antibody) 02/16/2022   Rheumatoid factor positive 02/16/2022   Thrombocytopenia ([place]) 01/21/2022   Colon cancer screening    Umbilical hernia without obstruction and without gangrene 05/14/2021   Bleeding esophageal varices (HCC)    Portal hypertension (HCC)    History of alcoholism ([place])    History of hepatitis C    Repeated falls 08/31/2016   Hepatic cirrhosis ([place]) 01/31/2015   Portal hypertensive gastropathy ([place]) 07/19/2014   Hepatic cirrhosis due to chronic hepatitis C infection ([place]) and some component of alcohol use 07/19/2014   Personal history of colonic polyps 07/19/2014   Iron deficiency anemia due to chronic blood loss 07/19/2014    Past Medical History:  Diagnosis Date   Allergy to [place] pig dander    Arthritis    "  hands and knees the most; mild" (03/15/2017)   Benign neoplasm of colon 07/19/2014   07/19/14 - 2 adenomas - repeat colonoscopy 2020    Cirrhosis ([place])    Diverticulosis 2008   Epilepsy ([place])    Esophageal varices with bleeding ([place]) 07/19/2014   Falls related to headaches    Fatty liver 2011   Abd [name]   Headaches thought due to old head injury    Hepatic cirrhosis due to chronic  hepatitis C infection ([place]) and some component of alcohol use 07/19/2014   Hepatitis C    History of blood transfusion 1987   "related to the zygomatic arch fracture"   Iron deficiency anemia secondary to blood loss (chronic) 07/19/2014   Portal hypertensive gastropathy ([place]) 07/19/2014   Scoliosis    Seizures ([place])    Torn rotator cuff     Family History  Problem Relation Age of Onset   Breast cancer Mother 12   Crohn's disease Father 47   Arthritis/Rheumatoid Father    Hearing loss Father    Epilepsy Sister 80   Ankylosing spondylitis Brother 3   Heart disease Maternal Grandmother    Alcohol abuse Maternal Grandfather    Cancer Paternal Grandfather        type unknown, mets   Alcohol abuse Maternal Uncle        several   Depression Daughter 11   Past Surgical History:  Procedure Laterality Date   ABDOMINAL SURGERY     Knife puncture to Stomach   COLONOSCOPY  2008   COLONOSCOPY N/A 07/19/2014   Procedure: COLONOSCOPY;  Surgeon: Gatha Mayer, MD;  Location: [facility];  Service: Endoscopy;  Laterality: N/A;   COLONOSCOPY WITH PROPOFOL N/A 06/19/2021   Procedure: COLONOSCOPY WITH PROPOFOL;  Surgeon: Gatha Mayer, MD;  Location: [facility] ENDOSCOPY;  Service: Endoscopy;  Laterality: N/A;   ESOPHAGEAL BANDING  04/14/2017   Procedure: ESOPHAGEAL BANDING;  Surgeon: Gatha Mayer, MD;  Location: [facility] ENDOS[facility];  Service: Endoscopy;;   ESOPHAGOGASTRODUODENOSCOPY N/A 07/19/2014   Procedure: ESOPHAGOGASTRODUODENOSCOPY (EGD);  Surgeon: Gatha Mayer, MD;  Location[facility] ENDOSCOPY;  Service: Endoscopy;  Laterality: N/A;   ESOPHAGOGASTRODUODENOSCOPY N/A 09/05/2014   Procedure: ESOPHAGOGASTRODUODENOSCOPY (EGD);  Surgeon: Gatha Mayer, MD;  Location: [facility];  Service: Endoscopy;  Laterality: N/A;   ESOPHAGOGASTRODUODENOSCOPY N/A 10/17/2014   Procedure: ESOPHAGOGASTRODUODENOSCOPY (EGD);  Surgeon: Gatha Mayer, MD;  Location: [facility];  Service: Endoscopy;  Laterality: N/A;    ESOPHAGOGASTRODUODENOSCOPY N/A 12/19/2014   Procedure: ESOPHAGOGASTRODUODENOSCOPY (EGD);  Surgeon: Gatha Mayer, MD;  Location: [facility];  Service: Endoscopy;  Laterality: N/A;   ESOPHAGOGASTRODUODENOSCOPY N/A 03/16/2017   Procedure: ESOPHAGOGASTRODUODENOSCOPY (EGD);  Surgeon: Ladene Artist, MD;  Location: [facility] ENDOSCOPY;  Service: Endoscopy;  Laterality: N/A;   ESOPHAGOGASTRODUODENOSCOPY (EGD) WITH PROPOFOL N/A 11/21/2015   Procedure: ESOPHAGOGASTRODUODENOSCOPY (EGD) WITH PROPOFOL;  Surgeon: Gatha Mayer, MD;  Location: [facility];  Service: Endoscopy;  Laterality: N/A;   ESOPHAGOGASTRODUODENOSCOPY (EGD) WITH PROPOFOL N/A 04/14/2017   Procedure: ESOPHAGOGASTRODUODENOSCOPY (EGD) WITH PROPOFOL;  Surgeon: Gatha Mayer, MD;  Location: [facility];  Service: Endoscopy;  Laterality: N/A;   ESOPHAGOGASTRODUODENOSCOPY (EGD) WITH PROPOFOL N/A 07/23/2017   Procedure: ESOPHAGOGASTRODUODENOSCOPY (EGD) WITH PROPOFOL;  Surgeon: Gatha Mayer, MD;  Location: [facility];  Service: Endoscopy;  Laterality: N/A;   ESOPHAGOGASTRODUODENOSCOPY (EGD) WITH PROPOFOL N/A 06/19/2021   Procedure: ESOPHAGOGASTRODUODENOSCOPY (EGD) WITH PROPOFOL;  Surgeon: Gatha Mayer, MD;  Location: [facility] END[facility];  Service: Endoscopy;  Laterality: N/A;   FRACTURE  SURGERY Right    cheek   GASTRIC VARICES BANDING N/A 10/17/2014   Procedure: GASTRIC VARICES BANDING;  Surgeon: Gatha Mayer, MD;  Location: [facility] ENDOS[facility];  Service: Endoscopy;  Laterality: N/A;   ORIF ZYGOMATIC FRACTURE Right 1987   RADIUS SYNOSTOSIS PROXIMAL RESECTION W/ ALLOGRAFT Right 1980s   at the elbow   THYROIDECTOMY, PARTIAL Left 2012   Social History   Social History Narrative   Divorced, one daughter   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 Lives with his dog in a 1 story home Father and his dog moved 2020.    Right handed   One story home          Marital status:   Divorced                            What year were you married ? 1990      Highest Level of education completed: 2/1/2 Years college      Current or past profession: Health and safety inspector       Do you exercise?   Yes                           Type & how often Walk every day  ( 1/2-2 miles) [employer] work      ADVANCED DIRECTIVES (Please bring copies)      Do you have a living will? Yes      Do you have a DNR form?   Yes                    If not, do you want to discuss one?       Do you have signed POA?HPOA forms? Yes                If so, please bring to your appointment      FUNCTIONAL STATUS- To be completed by Spouse / child / Staff       Do you have difficulty bathing or dressing yourself ?  No      Do you have difficulty preparing food or eating ?  No      Do you have difficulty managing your mediation ?  No      Do you have difficulty managing your finances ?  No      Do you have difficulty affording your medication ?  No      Immunization History  Administered Date(s) Administered   Hep A / Hep B 09/12/2014, 10/12/2014, 03/14/2015   Influenza,inj,Quad PF,6+ Mos 10/09/2014   PFIZER(Purple Top)SARS-COV-2 Vaccination 03/03/2020, 03/25/2020, 12/04/2020   Pneumococcal Conjugate-13 07/06/2017   Pneumococcal Polysaccharide-23 10/09/2014     Objective: Vital Signs: BP 113/66 (BP Location: Right Arm, Patient Position: Sitting, Cuff Size: Normal)    Pulse (!) 53    Resp 16    Ht 5\' 11"  (1.803 m)    Wt 199 lb (90.3 kg)    BMI 27.75 kg/m    Physical Exam HENT:     Mouth/Throat:     Mouth: Mucous membranes are moist.     Pharynx: Oropharynx is clear.  Eyes:     Conjunctiva/sclera: Conjunctivae normal.     Pupils: Pupils are equal, round, and reactive to light.  Cardiovascular:     Rate and  Rhythm: Normal rate and regular rhythm.  Pulmonary:     Effort: Pulmonary effort is normal.     Comments: Inspiratory crackles in bilateral lung  bases Musculoskeletal:     Comments: Trace bilateral pedal edema Distal legs with hyperpigmentation, loss of hair, few petechiae  Neurological:     General: No focal deficit present.     Mental Status: He is alert.     Deep Tendon Reflexes: Reflexes normal.  Psychiatric:        Mood and Affect: Mood normal.     Musculoskeletal Exam:  Neck full ROM no tenderness Shoulders full ROM right shoulder mild pain with end internal and external rotation ROM Elbows full ROM no tenderness or swelling Wrists full ROM no tenderness or swelling Mild bony changes on bilateral hands worst thumbs and distal finger joints Knees full ROM crepitus present bilaterally, no swelling Ankles full ROM no tenderness or swelling   Investigation: No additional findings.  Imaging: No results found.  Recent Labs: Lab Results  Component Value Date   WBC 3.2 (L) 10/03/2021   HGB 13.9 10/03/2021   PLT 67 (L) 10/03/2021   NA 135 10/03/2021   K 3.8 10/03/2021   CL 106 10/03/2021   CO2 21 (L) 10/03/2021   GLUCOSE 100 (H) 10/03/2021   BUN 10 10/03/2021   CREATININE 0.60 (L) 10/03/2021   BILITOT 0.9 10/03/2021   ALKPHOS 93 10/03/2021   AST 26 10/03/2021   ALT 18 10/03/2021   PROT 8.0 10/03/2021   ALBUMIN 4.1 10/03/2021   CALCIUM 9.8 10/03/2021   GFRAA >60 03/16/2017    Speciality Comments: No specialty comments available.  Procedures:  No procedures performed Allergies: Naproxen, Nsaids, and Other   Assessment / Plan:     Visit Diagnoses: Positive ANA (antinuclear antibody) Rheumatoid factor positive  Positive ANA and rheumatoid factor and low titers on review of systems and exam do not see any specific clinical criteria to suggest a systemic connective tissue disease underlying his pulmonary disorder.  Limited specific nuclear antibody markers were negative.  Rheumatoid factor can persist in cases with previous chronic hepatitis C infection although more commonly would not be caused by this at  a remote time.  He certainly has osteoarthritis changes involving bilateral hands knees and feet and right shoulder symptoms consistent with chronic rotator cuff pathology.  I do not believe he needs any more screening work-up at this time or any specific indication for immunosuppression.  Idiopathic pulmonary fibrosis ([place])  Has ongoing follow-up with Dr. Vaughan Browner planning to start [place].  Cirrhosis of liver without ascites, unspecified hepatic cirrhosis type ([place])   Orders: No orders of the defined types were placed in this encounter.  No orders of the defined types were placed in this encounter.   Follow-Up Instructions: No follow-ups on file.   Collier Salina, MD  Note - This record has been created using [employer].  Chart creation errors have been sought, but may not always  have been located. Such creation errors do not reflect on  the standard of medical care.

## 2022-02-16 ENCOUNTER — Ambulatory Visit: Payer: Medicare Other | Admitting: Internal Medicine

## 2022-02-16 ENCOUNTER — Other Ambulatory Visit: Payer: Self-pay

## 2022-02-16 ENCOUNTER — Encounter: Payer: Self-pay | Admitting: Internal Medicine

## 2022-02-16 VITALS — BP 113/66 | HR 53 | Resp 16 | Ht 71.0 in | Wt 199.0 lb

## 2022-02-16 DIAGNOSIS — R768 Other specified abnormal immunological findings in serum: Secondary | ICD-10-CM | POA: Insufficient documentation

## 2022-02-16 DIAGNOSIS — J84112 Idiopathic pulmonary fibrosis: Secondary | ICD-10-CM | POA: Diagnosis not present

## 2022-02-16 DIAGNOSIS — K7460 Unspecified cirrhosis of liver: Secondary | ICD-10-CM | POA: Diagnosis not present

## 2022-02-26 ENCOUNTER — Ambulatory Visit (INDEPENDENT_AMBULATORY_CARE_PROVIDER_SITE_OTHER): Payer: Medicare Other | Admitting: Psychologist

## 2022-02-26 ENCOUNTER — Other Ambulatory Visit: Payer: Self-pay

## 2022-02-26 DIAGNOSIS — F330 Major depressive disorder, recurrent, mild: Secondary | ICD-10-CM | POA: Diagnosis not present

## 2022-02-26 NOTE — Plan of Care (Signed)

## 2022-02-26 NOTE — Progress Notes (Signed)
[facility] Counselor Initial Adult Exam  Name: Francisco [name] Date: 02/26/2022 MRN: [id number] DOB: 05-27-1956 PCP: Yvonna Alanis, NP  Time spent: 3:05 pm to 3:45 pm; total time: 40 minutes  This session was held via in person. The patient consented to in-person therapy and was in the clinician's office. Limits of confidentiality were discussed with the patient.   Guardian/Payee:  NA    Paperwork requested: No   Reason for Visit /Presenting Problem: Depression and anger outbursts  Mental Status Exam: Appearance:   Well Groomed     Behavior:  Appropriate  Motor:  Normal  Speech/Language:   Clear and Coherent  Affect:  Appropriate  Mood:  normal  Thought process:  normal  Thought content:    WNL  Sensory/Perceptual disturbances:    WNL  Orientation:  oriented to person, place, and time/date  Attention:  Good  Concentration:  Good  Memory:  WNL  Fund of knowledge:   Good  Insight:    Fair  Judgment:   Good and Fair  Impulse Control:  Good    Reported Symptoms:  The patient endorsed experiencing the following: irritability, anger, sadness, feeling down, lack of motivation, low self-esteem, rumination of negative thoughts, avoiding pleasurable activities, and social isolation. He denied suicidal and homicidal ideation.   Risk Assessment: Danger to Self:  No Self-injurious Behavior: No Danger to Others: No Duty to Warn:no Physical Aggression / Violence:No  Access to Firearms a concern: No  Gang Involvement:No  Patient / guardian was educated about steps to take if suicide or homicide risk level increases between visits: n/a While future psychiatric events cannot be accurately predicted, the patient does not currently require acute inpatient psychiatric care and does not currently meet [facility] involuntary commitment criteria.  Substance Abuse History: Current substance abuse:  Patient stopped drinking a few years ago. Patient plans to have his last  cigarette the evening before his 66th bithday.      Past Psychiatric History:   Previous psychological history is significant for depression Outpatient Providers:NA History of Psych Hospitalization: No  Psychological Testing:  NA    Abuse History:  Victim of: No.,  NA    Report needed: No. Victim of Neglect:No. Perpetrator of  NA   Witness / Exposure to Domestic Violence: No   Protective Services Involvement: No  Witness to [employer] Violence:  No   Family History:  Family History  Problem Relation Age of Onset   Breast cancer Mother 41   Crohn's disease Father 17   Arthritis/Rheumatoid Father    Hearing loss Father    Epilepsy Sister 51   Ankylosing spondylitis Brother 8   Heart disease Maternal Grandmother    Alcohol abuse Maternal Grandfather    Cancer Paternal Grandfather        type unknown, mets   Alcohol abuse Maternal Uncle        several   Depression Daughter 71    Living situation: the patient lives with their family. Patient's father lives with him  Sexual Orientation: Straight  Relationship Status: divorced  Name of spouse / other:NA If a parent, number of children / ages:One daughter who is soon to be 46 years old.   Support Systems: Daughter  Financial Stress:  No   Income/Employment/Disability: Actor: No   Educational History: Education: Museum/gallery exhibitions officer: Buddhist  Any cultural differences that may affect / interfere with treatment:  not applicable   Recreation/Hobbies: Walking with  his dog  Stressors: Other: Taking care of his father at home; dealing with home repairs like a boiler, recent diagnosis of pulmonary fibrosis. Patient has other health conditions also     Strengths: Hopefulness, Self Advocate, and Able to Communicate Effectively  Barriers:  Multiple stressors   Legal History: Pending legal issue / charges: The patient has no significant history of  legal issues. History of legal issue / charges:  NA  Medical History/Surgical History: reviewed Past Medical History:  Diagnosis Date   Allergy to [place] pig dander    Arthritis    "hands and knees the most; mild" (03/15/2017)   Benign neoplasm of colon 07/19/2014   07/19/14 - 2 adenomas - repeat colonoscopy 2020    Cirrhosis ([place])    Diverticulosis 2008   Epilepsy ([place])    Esophageal varices with bleeding ([place]) 07/19/2014   Falls related to headaches    Fatty liver 2011   Abd [name]   Headaches thought due to old head injury    Hepatic cirrhosis due to chronic hepatitis C infection ([place]) and some component of alcohol use 07/19/2014   Hepatitis C    History of blood transfusion 1987   "related to the zygomatic arch fracture"   Iron deficiency anemia secondary to blood loss (chronic) 07/19/2014   Portal hypertensive gastropathy ([place]) 07/19/2014   Scoliosis    Seizures ([place])    Torn rotator cuff     Past Surgical History:  Procedure Laterality Date   ABDOMINAL SURGERY     Knife puncture to Stomach   COLONOSCOPY  2008   COLONOSCOPY N/A 07/19/2014   Procedure: COLONOSCOPY;  Surgeon: Gatha Mayer, MD;  Location: [place];  Service: Endoscopy;  Laterality: N/A;   COLONOSCOPY WITH PROPOFOL N/A 06/19/2021   Procedure: COLONOSCOPY WITH PROPOFOL;  Surgeon: Gatha Mayer, MD;  [facility];  Service: Endoscopy;  Laterality: N/A;   ESOPHAGEAL BANDING  04/14/2017   Procedure: ESOPHAGEAL BANDING;  Surgeon: Gatha Mayer, MD;  [facility];  Service: Endoscopy;;   ESOPHAGOGASTRODUODENOSCOPY N/A 07/19/2014   Procedure: ESOPHAGOGASTRODUODENOSCOPY (EGD);  Surgeon: Gatha Mayer, MD;  [facility];  Service: Endoscopy;  Laterality: N/A;   ESOPHAGOGASTRODUODENOSCOPY N/A 09/05/2014   Procedure: ESOPHAGOGASTRODUODENOSCOPY (EGD);  Surgeon: Gatha Mayer, MD;  [facility];  Service: Endoscopy;  Laterality: N/A;   ESOPHAGOGASTRODUODENOSCOPY N/A 10/17/2014    Procedure: ESOPHAGOGASTRODUODENOSCOPY (EGD);  Surgeon: Gatha Mayer, MD;  Location[facility];  Service: Endoscopy;  Laterality: N/A;   ESOPHAGOGASTRODUODENOSCOPY N/A 12/19/2014   Procedure: ESOPHAGOGASTRODUODENOSCOPY (EGD);  Surgeon: Gatha Mayer, MD;  Location: [facility];  Service: Endoscopy;  Laterality: N/A;   ESOPHAGOGASTRODUODENOSCOPY N/A 03/16/2017   Procedure: ESOPHAGOGASTRODUODENOSCOPY (EGD);  Surgeon: Ladene Artist, MD;  Location: [facility] ENDOSCOPY;  Service: Endoscopy;  Laterality: N/A;   ESOPHAGOGASTRODUODENOSCOPY (EGD) WITH PROPOFOL N/A 11/21/2015   Procedure: ESOPHAGOGASTRODUODENOSCOPY (EGD) WITH PROPOFOL;  Surgeon: Gatha Mayer, MD;  Location: [place];  Service: Endoscopy;  Laterality: N/A;   ESOPHAGOGASTRODUODENOSCOPY (EGD) WITH PROPOFOL N/A 04/14/2017   Procedure: ESOPHAGOGASTRODUODENOSCOPY (EGD) WITH PROPOFOL;  Surgeon: Gatha Mayer, MD;  [facility];  Service: Endoscopy;  Laterality: N/A;   ESOPHAGOGASTRODUODENOSCOPY (EGD) WITH PROPOFOL N/A 07/23/2017   Procedure: ESOPHAGOGASTRODUODENOSCOPY (EGD) WITH PROPOFOL;  Surgeon: Gatha Mayer, MD;  [facility];  Service: Endoscopy;  Laterality: N/A;   ESOPHAGOGASTRODUODENOSCOPY (EGD) WITH PROPOFOL N/A 06/19/2021   Procedure: ESOPHAGOGASTRODUODENOSCOPY (EGD) WITH PROPOFOL;  Surgeon: Gatha Mayer, MD;  [facility];  Service:  Endoscopy;  Laterality: N/A;   FRACTURE SURGERY Right    cheek   GASTRIC VARICES BANDING N/A 10/17/2014   Procedure: GASTRIC VARICES BANDING;  Surgeon: Gatha Mayer, MD;  [facility];  Service: Endoscopy;  Laterality: N/A;   ORIF ZYGOMATIC FRACTURE Right 1987   RADIUS SYNOSTOSIS PROXIMAL RESECTION W/ ALLOGRAFT Right 1980s   at the elbow   THYROIDECTOMY, PARTIAL Left 2012    Medications: Current Outpatient Medications  Medication Sig Dispense Refill   acetaminophen (TYLENOL) 500 MG tablet Take 500 mg by mouth every 4 (four) hours as needed for  moderate pain or mild pain.     albuterol (VENTOLIN HFA) 108 (90 Base) MCG/ACT inhaler Inhale 2 puffs into the lungs every 6 (six) hours as needed for wheezing or shortness of breath. (Patient not taking: Reported on 02/16/2022) 18 g 0   Fluticasone-Umeclidin-Vilant (TRELEGY ELLIPTA) 200-62.5-25 MCG/ACT AEPB Inhale 1 puff into the lungs daily. 60 each 5   lamoTRIgine (LAMICTAL) 25 MG tablet TAKE 1 TABLET AT BEDTIME FOR 14 DAYS, THEN 1 TABLET TWICE DAILY FOR 14 DAYS, THEN 2 TABLETS TWICE DAILY 120 tablet 0   levETIRAcetam (KEPPRA) 500 MG tablet Take 1 tablet (500 mg total) by mouth 2 (two) times daily.     Multiple Vitamins-Minerals (MULTIVITAMIN WITH MINERALS) tablet Take 1 tablet by mouth daily.     Pirfenidone (ESBRIET) 267 MG TABS Take 1 tab three times daily for 7 days, then 2 tabs three times daily for 7 days, then 3 tabs three times daily thereafter. 270 tablet 0   No current facility-administered medications for this visit.    Allergies  Allergen Reactions   Naproxen Other (See Comments)    Esophageal bleeding   Nsaids     Avoid due to liver condition    Other     [place] Pig    Diagnoses:  F33.0 major depressive affective disorder, recurrent, mild   Plan of Care: The patient is a 66 year old Caucasian male who was referred due to experiencing depressive symptoms. The patient lives at home with his father, and two dogs. The patient meets criteria for a diagnosis of F33.0 major depressive affective disorder, recurrent, mild based off of the following: irritability, anger, sadness, feeling down, lack of motivation, low self-esteem, rumination of negative thoughts, avoiding pleasurable activities, and social isolation. He denied suicidal and homicidal ideation.   The patient stated that he wanted better insight into self and coping skills.He also wants to process emotions.  This psychologist makes the recommendation that the patient participate in therapy at least once a month.  Depending on severity possibly bi-weekly.    Conception Chancy, PsyD

## 2022-02-26 NOTE — Progress Notes (Signed)
                Francisco Grable, PsyD 

## 2022-03-06 ENCOUNTER — Other Ambulatory Visit: Payer: Self-pay | Admitting: Neurology

## 2022-03-09 ENCOUNTER — Encounter: Payer: Self-pay | Admitting: Pulmonary Disease

## 2022-03-09 ENCOUNTER — Other Ambulatory Visit: Payer: Self-pay

## 2022-03-09 ENCOUNTER — Ambulatory Visit: Payer: Medicare Other | Admitting: Pulmonary Disease

## 2022-03-09 VITALS — BP 116/64 | HR 60 | Temp 97.6°F | Ht 71.0 in | Wt 205.4 lb

## 2022-03-09 DIAGNOSIS — Z5181 Encounter for therapeutic drug level monitoring: Secondary | ICD-10-CM | POA: Diagnosis not present

## 2022-03-09 DIAGNOSIS — J849 Interstitial pulmonary disease, unspecified: Secondary | ICD-10-CM

## 2022-03-09 NOTE — Patient Instructions (Signed)
We reviewed your diagnosis of IPF and I am glad to answer questions regarding your medications and diagnosis ?Start Esbriet this week ?We will check labs including hepatic function panel monthly starting next month ?Follow-up in clinic in 3 months ?

## 2022-03-09 NOTE — Progress Notes (Signed)
? ?      ?Francisco [name]    [id number]    October 28, 1956 ? ?Primary Care Physician:[place], Amy E, NP ? ?Referring Physician: Yvonna Alanis, NP ?[address] ?[place],  [place] [address] ? ?Chief complaint: Follow-up for interstitial lung disease ? ?HPI: ?66 year old with history of allergies, hepatitis C, cirrhosis referred for evaluation of abnormal finding seen on CT scan. ?Screening CT in early 2022 showed mild subpleural reticulation.  Follow-up high-resolution CT in July 2022 showed pulmonary fibrosis with honeycombing and has been referred here for further evaluation next ?Has complaints of chest congestion, no dyspnea, has small joint pain, stiffness ?Has strong family history of CTD with rheumatoid arthritis in father and ankylosing spondylitis and brother ? ?He has history of cirrhosis secondary to hepatitis C which was treated with Harvoni. ? 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  No mold, hot tub, Jacuzzi.  No feather pillows or comforters.  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 ?Interval history: ?Case was discussed at multidisciplinary conference on 09/02/2021 and CT findings was consistent with UIP fibrosis.  Esbriet has been approved and he is awaiting shipment. ? ?He has consulted with Dr. Benjamine Mola, rheumatology who does not feel there is any evidence of connective tissue disease. ? ?He is here with his daughter to get a better idea of his diagnosis and expected trajectory and outcomes. ? ?Outpatient Encounter Medications as of 03/09/2022  ?Medication Sig  ? acetaminophen (TYLENOL) 500 MG tablet Take 500 mg by  mouth every 4 (four) hours as needed for moderate pain or mild pain.  ? albuterol (VENTOLIN HFA) 108 (90 Base) MCG/ACT inhaler Inhale 2 puffs into the lungs every 6 (six) hours as needed for wheezing or shortness of breath.  ? Fluticasone-Umeclidin-Vilant (TRELEGY ELLIPTA) 200-62.5-25 MCG/ACT AEPB Inhale 1 puff into the lungs daily.  ? lamoTRIgine (LAMICTAL) 25 MG tablet TAKE 1 TABLET AT BEDTIME FOR 14 DAYS, THEN 1 TABLET TWICE DAILY FOR 14 DAYS, THEN 2 TABLETS TWICE DAILY  ? levETIRAcetam (KEPPRA) 500 MG tablet Take 1 tablet (500 mg total) by mouth 2 (two) times daily.  ? Multiple Vitamins-Minerals (MULTIVITAMIN WITH MINERALS) tablet Take 1 tablet by mouth daily.  ? Pirfenidone (ESBRIET) 267 MG TABS Take 1 tab three times daily for 7 days, then 2 tabs three times daily for 7 days, then 3 tabs three times daily thereafter. (Patient not taking: Reported on 03/09/2022)  ? ?No facility-administered encounter medications on file as of 03/09/2022.  ? ? ?Physical Exam: ?Blood pressure 116/64, pulse 60, temperature 97.6 ?F (36.4 ?C), temperature source Oral, height '5\' 11"'$  (1.803 m), weight 205 lb 6.4 oz (93.2 kg), SpO2 98 %. ?Gen:      No acute distress ?HEENT:  EOMI, sclera anicteric ?Neck:     No masses; no thyromegaly ?Lungs:    Clear to auscultation bilaterally; normal respiratory effort ?CV:         Regular rate and rhythm; no murmurs ?Abd:      + bowel sounds; soft, non-tender; no palpable masses, no distension ?Ext:    No edema; adequate peripheral perfusion ?Skin:      Warm and dry; no rash ?Neuro: alert and  oriented x 3 ?Psych: normal mood and affect  ? ?Data Reviewed: ?Imaging: ?CT lung cancer screening 04/05/2021-subpleural reticulation, calcified granuloma ? ?High-resolution CT 06/27/2021-mild groundglass attenuation, subpleural reticulation with honeycombing most evident at the anterior aspect of the upper lobe, advanced cirrhosis ?I have reviewed the images personally ? ?PFTs: ?01/08/2022 ?FVC 4.49 [90%], FEV1 3.66  [98%], F/F 81, TLC 5.92 [79%], DLCO 15.19 [53%] ?Minimal restriction, moderate-severe diffusion defect ? ?Labs: ?CTD serologies 08/18/2021-ANA 1:80, cytoplasmic, rheumatoid factor 16, and anti Jo 1-20 ? ?Assessment:  ?Follow-up for interstitial lung disease ?Case reviewed at multidisciplinary conference in September 2022 ?CT shows moderate patchy reticulation with traction bronchiectasis, honeycombing with basilar predominance ?Although there is increased disease in the anterior aspect of the upper lobe this process is felt to be more bullous emphysema and not honeycombing. ? ?Pattern is still consistent with UIP by ATS criteria and this could be IPF in the right clinical setting especially given significant clubbing ? ?He does have strong family history of connective tissue disease with positive ANA, rheumatoid factor and Jo 1.  Rheumatology evaluation noted with no evidence of autoimmune process ?No significant exposures noted ? ?We discussed his findings in detail today and he is going to start [place] soon.  Monitor labs. ?He has history of hepatitis C, cirrhosis and will need close monitoring. ? ?Emphysema ?He has significant emphysema on CT scan though no obstruction on spirometry.  Suspect that he does not have obstruction due to combined presence of emphysema and pulmonary fibrosis ?Trial Trelegy inhaler to see if it helps his breathing ? ?Plan/Recommendations: ?Start Esbriet ?Continue Trelegy ?Monitor hepatic panel ? ?Marshell Garfinkel MD ?[facility] ?03/09/2022, 1:52 PM ? ?CC: [place], Amy E, NP ? ?  ?

## 2022-03-10 ENCOUNTER — Ambulatory Visit: Payer: Medicare Other | Admitting: Internal Medicine

## 2022-03-11 ENCOUNTER — Encounter: Payer: Self-pay | Admitting: Orthopedic Surgery

## 2022-03-12 ENCOUNTER — Ambulatory Visit (INDEPENDENT_AMBULATORY_CARE_PROVIDER_SITE_OTHER): Payer: Medicare Other | Admitting: Orthopedic Surgery

## 2022-03-12 ENCOUNTER — Other Ambulatory Visit: Payer: Self-pay

## 2022-03-12 ENCOUNTER — Encounter: Payer: Self-pay | Admitting: Pulmonary Disease

## 2022-03-12 VITALS — BP 106/58 | HR 59 | Temp 97.5°F | Ht 71.0 in | Wt 205.8 lb

## 2022-03-12 DIAGNOSIS — R768 Other specified abnormal immunological findings in serum: Secondary | ICD-10-CM | POA: Diagnosis not present

## 2022-03-12 DIAGNOSIS — J84112 Idiopathic pulmonary fibrosis: Secondary | ICD-10-CM | POA: Diagnosis not present

## 2022-03-12 DIAGNOSIS — D696 Thrombocytopenia, unspecified: Secondary | ICD-10-CM | POA: Diagnosis not present

## 2022-03-12 DIAGNOSIS — E663 Overweight: Secondary | ICD-10-CM

## 2022-03-12 DIAGNOSIS — F339 Major depressive disorder, recurrent, unspecified: Secondary | ICD-10-CM

## 2022-03-12 DIAGNOSIS — G40309 Generalized idiopathic epilepsy and epileptic syndromes, not intractable, without status epilepticus: Secondary | ICD-10-CM

## 2022-03-12 DIAGNOSIS — K7460 Unspecified cirrhosis of liver: Secondary | ICD-10-CM

## 2022-03-12 NOTE — Patient Instructions (Addendum)
Please get tetanus and shingrix (shingles) ? ?Please schedule schedule lab visit to check cholesterol  ?

## 2022-03-12 NOTE — Progress Notes (Signed)
? ? ?Careteam: ?Patient Care Team: ?Yvonna Alanis, NP as PCP - General (Adult Health Nurse Practitioner) ?Pieter Partridge, DO as Consulting Physician (Neurology) ? ?Seen by: Windell Moulding, AGNP-C ? ?PLACE OF SERVICE:  ?[facility]  ?Advanced Directive information ?Does Patient Have a Medical Advance Directive?: Yes, Type of Advance Directive: [place], Does patient want to make changes to medical advance directive?: No - Patient declined ? ?Allergies  ?Allergen Reactions  ? Naproxen Other (See Comments)  ?  Esophageal bleeding  ? Nsaids   ?  Avoid due to liver condition   ? Other   ?  [place] Pig  ? ? ?Chief Complaint  ?Patient presents with  ? Medical Management of Chronic Issues  ?  Patient presents today for follow up. MMSE score?   ? Quality Metric Gaps  ?  Discuss the need for TDAP, Shingrix, and pneumonia vaccine, or post pone if patient refuses.   ? ? ? ?HPI: Patient is a 66 y.o. male seen today for medical management of chronic conditions.  ? ?MMSE 30/30, correct shapes and clock.  ? ?Diagnosed with pulmonary fibrosis 12/2021. CT chest showed moderate patchy reticulation with traction bronchiectasis, honeycombing with basilar predominance. Started on Esbriet. He quit smoking 1 week ago. Denies cough, sob or wheezing. Remains on Trelegy for emphysema. Advised to see rheumatology due to family history of connective tissue disease and positive ANA/rheumatoid factor. Recently seen by Dr. Benjamine Mola, does not feel there is any evidence of connective tissue disease.  ? ?Recently seen by GI for hepatitis C induced cirrhosis. Receiving liver ultrasound every 6 months, no sign of live tumor. Low wbc and platelets due to portal hypertension. Denies abdominal pain, ascites or edema.  ? ?Depression- advised to see Dr. Michail Sermon for depression after fibrosis diagnosis. Completed one therapy session, more scheduled. Denies mood changes. Remains on Lamictal. Reading more literature on Buddhism to help with coping.   ? ?No recent seizures. Remains on keppra.  ? ?Discussed tdap and shingles vaccines today.  ? ?No recent falls, injuries or hospitalizations.  ? ?Caregiver for father, planning on transitioning him to nursing home in near future.  ? ?Advanced care planning done.  ? ?Review of Systems:  ?Review of Systems  ?Constitutional:  Negative for chills, fever and malaise/fatigue.  ?HENT:  Negative for hearing loss and sore throat.   ?Eyes:  Negative for blurred vision and double vision.  ?Respiratory:  Positive for cough and sputum production. Negative for shortness of breath and wheezing.   ?Cardiovascular:  Negative for chest pain and leg swelling.  ?Gastrointestinal:  Negative for abdominal pain, blood in stool, constipation, diarrhea, heartburn, nausea and vomiting.  ?Genitourinary:  Negative for dysuria, frequency and hematuria.  ?Musculoskeletal:  Negative for falls and joint pain.  ?Neurological:  Negative for dizziness, weakness and headaches.  ?Psychiatric/Behavioral:  Positive for depression. Negative for memory loss. The patient is nervous/anxious. The patient does not have insomnia.   ? ?Past Medical History:  ?Diagnosis Date  ? Allergy to [place] pig dander   ? Arthritis   ? "hands and knees the most; mild" (03/15/2017)  ? Benign neoplasm of colon 07/19/2014  ? 07/19/14 - 2 adenomas - repeat colonoscopy 2020   ? Cirrhosis ([place])   ? Diverticulosis 2008  ? Epilepsy ([place])   ? Esophageal varices with bleeding ([place]) 07/19/2014  ? Falls related to headaches   ? Fatty liver 2011  ? Abd [name]  ? Headaches thought due to old head  injury   ? Hepatic cirrhosis due to chronic hepatitis C infection ([place]) and some component of alcohol use 07/19/2014  ? Hepatitis C   ? History of blood transfusion 1987  ? "related to the zygomatic arch fracture"  ? Iron deficiency anemia secondary to blood loss (chronic) 07/19/2014  ? Portal hypertensive gastropathy ([place]) 07/19/2014  ? Scoliosis   ? Seizures ([place])   ? Torn rotator cuff   ? ?Past  Surgical History:  ?Procedure Laterality Date  ? ABDOMINAL SURGERY    ? Knife puncture to Stomach  ? COLONOSCOPY  2008  ? COLONOSCOPY N/A 07/19/2014  ? Procedure: COLONOSCOPY;  Surgeon: Gatha Mayer, MD;  Location: [place];  Service: Endoscopy;  Laterality: N/A;  ? COLONOSCOPY WITH PROPOFOL N/A 06/19/2021  ? Procedure: COLONOSCOPY WITH PROPOFOL;  Surgeon: Gatha Mayer, MD;  [facility];  Service: Endoscopy;  Laterality: N/A;  ? ESOPHAGEAL BANDING  04/14/2017  ? Procedure: ESOPHAGEAL BANDING;  Surgeon: Gatha Mayer, MD;  [facility];  Service: Endoscopy;;  ? ESOPHAGOGASTRODUODENOSCOPY N/A 07/19/2014  ? Procedure: ESOPHAGOGASTRODUODENOSCOPY (EGD);  Surgeon: Gatha Mayer, MD;  [facility]COPY;  Service: Endoscopy;  Laterality: N/A;  ? ESOPHAGOGASTRODUODENOSCOPY N/A 09/05/2014  ? Procedure: ESOPHAGOGASTRODUODENOSCOPY (EGD);  Surgeon: Gatha Mayer, MD;  Location[facility];  Service: Endoscopy;  Laterality: N/A;  ? ESOPHAGOGASTRODUODENOSCOPY N/A 10/17/2014  ? Procedure: ESOPHAGOGASTRODUODENOSCOPY (EGD);  Surgeon: Gatha Mayer, MD;  [facility];  Service: Endoscopy;  Laterality: N/A;  ? ESOPHAGOGASTRODUODENOSCOPY N/A 12/19/2014  ? Procedure: ESOPHAGOGASTRODUODENOSCOPY (EGD);  Surgeon: Gatha Mayer, MD;  [facility];  Service: Endoscopy;  Laterality: N/A;  ? ESOPHAGOGASTRODUODENOSCOPY N/A 03/16/2017  ? Procedure: ESOPHAGOGASTRODUODENOSCOPY (EGD);  Surgeon: Ladene Artist, MD;  Location[facility];  Service: Endoscopy;  Laterality: N/A;  ? ESOPHAGOGASTRODUODENOSCOPY (EGD) WITH PROPOFOL N/A 11/21/2015  ? Procedure: ESOPHAGOGASTRODUODENOSCOPY (EGD) WITH PROPOFOL;  Surgeon: Gatha Mayer, MD;  Location: [place];  Service: Endoscopy;  Laterality: N/A;  ? ESOPHAGOGASTRODUODENOSCOPY (EGD) WITH PROPOFOL N/A 04/14/2017  ? Procedure: ESOPHAGOGASTRODUODENOSCOPY (EGD) WITH PROPOFOL;  Surgeon: Gatha Mayer, MD;  [facility];  Service: Endoscopy;   Laterality: N/A;  ? ESOPHAGOGASTRODUODENOSCOPY (EGD) WITH PROPOFOL N/A 07/23/2017  ? Procedure: ESOPHAGOGASTRODUODENOSCOPY (EGD) WITH PROPOFOL;  Surgeon: Gatha Mayer, MD;  [facility];  Service: Endoscopy;  Laterality: N/A;  ? ESOPHAGOGASTRODUODENOSCOPY (EGD) WITH PROPOFOL N/A 06/19/2021  ? Procedure: ESOPHAGOGASTRODUODENOSCOPY (EGD) WITH PROPOFOL;  Surgeon: Gatha Mayer, MD;  [facility];  Service: Endoscopy;  Laterality: N/A;  ? FRACTURE SURGERY Right   ? cheek  ? GASTRIC VARICES BANDING N/A 10/17/2014  ? Procedure: GASTRIC VARICES BANDING;  Surgeon: Gatha Mayer, MD;  [facility];  Service: Endoscopy;  Laterality: N/A;  ? ORIF ZYGOMATIC FRACTURE Right 1987  ? RADIUS SYNOSTOSIS PROXIMAL RESECTION W/ ALLOGRAFT Right 1980s  ? at the elbow  ? THYROIDECTOMY, PARTIAL Left 2012  ? ?Social History: ?  reports that he quit smoking 8 days ago. His smoking use included cigarettes. He started smoking about 47 years ago. He has a 23.50 pack-year smoking history. He has never used smokeless tobacco. He reports that he does not drink alcohol and does not use drugs. ? ?Family History  ?Problem Relation Age of Onset  ? Breast cancer Mother 8  ? Crohn's disease Father 23  ? Arthritis/Rheumatoid Father   ? Hearing loss Father   ? Epilepsy Sister 50  ? Ankylosing spondylitis Brother 80  ? Heart disease Maternal Grandmother   ?  Alcohol abuse Maternal Grandfather   ? Cancer Paternal Grandfather   ?     type unknown, mets  ? Alcohol abuse Maternal Uncle   ?     several  ? Depression Daughter 56  ? ? ?Medications: ?Patient's Medications  ?New Prescriptions  ? No medications on file  ?Previous Medications  ? ACETAMINOPHEN (TYLENOL) 500 MG TABLET    Take 500 mg by mouth every 4 (four) hours as needed for moderate pain or mild pain.  ? ALBUTEROL (VENTOLIN HFA) 108 (90 BASE) MCG/ACT INHALER    Inhale 2 puffs into the lungs every 6 (six) hours as needed for wheezing or shortness of breath.  ?  FLUTICASONE-UMECLIDIN-VILANT (TRELEGY ELLIPTA) 200-62.5-25 MCG/ACT AEPB    Inhale 1 puff into the lungs daily.  ? LAMOTRIGINE (LAMICTAL) 25 MG TABLET    2 tablets twice daily  ? LEVETIRACETAM (KEPPRA) 500 MG TABLET

## 2022-03-17 ENCOUNTER — Other Ambulatory Visit: Payer: Self-pay

## 2022-03-17 ENCOUNTER — Other Ambulatory Visit: Payer: Medicare Other

## 2022-03-18 LAB — LIPID PANEL
Cholesterol: 159 mg/dL (ref <200)
HDL: 69 mg/dL (ref >=40)
LDL Cholesterol (Calc): 79 mg/dL (calc)
Non-HDL Cholesterol (Calc): 90 mg/dL (calc) (ref <130)
Total CHOL/HDL Ratio: 2.3 (calc) (ref <5.0)
Triglycerides: 40 mg/dL (ref <150)

## 2022-03-20 ENCOUNTER — Ambulatory Visit (INDEPENDENT_AMBULATORY_CARE_PROVIDER_SITE_OTHER): Payer: Medicare Other | Admitting: Psychologist

## 2022-03-20 DIAGNOSIS — F330 Major depressive disorder, recurrent, mild: Secondary | ICD-10-CM | POA: Diagnosis not present

## 2022-03-20 NOTE — Progress Notes (Signed)
                Lebert Lovern, PsyD 

## 2022-03-20 NOTE — Progress Notes (Signed)
[facility] Counselor/Therapist Progress Note ? ?Patient ID: Francisco [name], MRN: [id number],   ? ?Date: 03/20/2022 ? ?Time Spent: 9:03 am to 9:45 am; total time: 42 minutes ? ? This session was held via in person. The patient consented to in-person therapy and was in the clinician's office. Limits of confidentiality were discussed with the patient.  ? ?Treatment Type: Individual Therapy ? ?Reported Symptoms: Irritability towards father ? ?Mental Status Exam: ?Appearance:  Well Groomed     ?Behavior: Appropriate  ?Motor: Normal  ?Speech/Language:  Clear and Coherent  ?Affect: Appropriate  ?Mood: normal  ?Thought process: normal  ?Thought content:   WNL  ?Sensory/Perceptual disturbances:   WNL  ?Orientation: oriented to person, place, and time/date  ?Attention: Good  ?Concentration: Good  ?Memory: WNL  ?Fund of knowledge:  Good  ?Insight:   Good  ?Judgment:  Good  ?Impulse Control: Good  ? ?Risk Assessment: ?Danger to Self:  No ?Self-injurious Behavior: No ?Danger to Others: No ?Duty to Warn:no ?Physical Aggression / Violence:No  ?Access to Firearms a concern: No  ?Gang Involvement:No  ? ?Subjective: Beginning the session, the patient described himself as doing well. Elaborating, he talked about how in a week he will have a break from caring for his father. After reviewing the treatment plan, patient spent the session reflecting on some concerns related to his health after the recent diagnosis he received. He briefly processed the fear of dying. From there, he talked about his father being gone for a few days, which helps him. He then spent time reflecting on the point of contention, which is his father "grunting". Patient explored factors that may contribute to it. He ended the session by reflecting on a new insight as to why he has not disclosed to his father his frustration, as it is possible that his father may stop. He processed thoughts and emotions. He asked to follow up. He denied suicidal  and homicidal ideation.   ? ? ?Interventions:  Worked on developing a therapeutic relationship with the patient using active listening and reflective statements. Provide emotional support using empathy and validation. Reviewed the treatment plan. Reviewed events since the intake. Praised patient for doing slightly better and explored what has assisted the patient. Used summary statements. Used an analogy to assist the patient gain insight into self. Processed what patient will do while father is out of town. Used socratic questions to assist the patient gain insight into self. Briefly processed fears related to health condition. Explored the etiology of the grunting expressed by the father. Validated expressed frustration. Used solution focused therapy to assist the patient. Processed barriers that have made it difficult to express self towards father. Assisted the patient gain insight into self. Assigned homework. Assessed for suicidal and homicidal ideation.  ? ?Homework: Reflect on why he has not informed his father of the grunt. Possibly express frustration towards father ? ?Next Session: Review homework and emotional support ? ?Diagnosis: F33.0 major depressive affective disorder, recurrent, mild  ? ?Plan:  ? ?Goals ?Alleviate depressive symptoms ?Recognize, accept, and cope with depressive feelings ?Develop healthy thinking patterns ?Develop healthy interpersonal relationships ? ?Objectives target date for all objectives is 02/27/2023 ?Cooperate with a medication evaluation by a physician ?Verbalize an accurate understanding of depression ?Verbalize an understanding of the treatment ?Identify and replace thoughts that support depression ?Learn and implement behavioral strategies ?Verbalize an understanding and resolution of current interpersonal problems ?Learn and implement problem solving and decision making skills ?Learn and implement conflict resolution  skills to resolve interpersonal problems ?Verbalize an  understanding of healthy and unhealthy emotions verbalize insight into how past relationships may be influence current experiences with depression ?Use mindfulness and acceptance strategies and increase value based behavior  ?Increase hopeful statements about the future.  ?Interventions ?Consistent with treatment model, discuss how change in cognitive, behavioral, and interpersonal can help client alleviate depression ?CBT ?Behavioral activation help the client explore the relationship, nature of the dispute,  ?Help the client develop new interpersonal skills and relationships ?Conduct Problem so living therapy ?Teach conflict resolution skills ?Use a process-experiential approach ?Conduct TLDP ?Conduct ACT ?Evaluate need for psychotropic medication ?Monitor adherence to medication  ? ?The patient and clinician reviewed the treatment plan on 03/20/2022. The patient approved of the treatment plan.  ? ? ?Conception Chancy, PsyD ? ? ? ?

## 2022-03-26 ENCOUNTER — Telehealth: Payer: Self-pay

## 2022-03-26 NOTE — Telephone Encounter (Signed)
Telephone call from Patient, Please clarify if he sis to continue taking both keppra and Lamictal. ? ? ?Per Last ov note: 01/16/22 ?/Due to irritability and depressed mood, will transition from levetiracetam to lamotrigine.  Continue levetiracetam for now.  Start lamotrigine titrating to '50mg'$  twice daily at which point will discontinue levetiracetam.  If he should have seizures, would increase lamotrigine to '100mg'$  twice daily. ?Pt advised of this note.  ? ? ?

## 2022-04-13 ENCOUNTER — Ambulatory Visit (INDEPENDENT_AMBULATORY_CARE_PROVIDER_SITE_OTHER): Payer: Medicare Other | Admitting: Psychologist

## 2022-04-13 ENCOUNTER — Telehealth: Payer: Self-pay | Admitting: Neurology

## 2022-04-13 DIAGNOSIS — F330 Major depressive disorder, recurrent, mild: Secondary | ICD-10-CM

## 2022-04-13 NOTE — Telephone Encounter (Signed)
Patient said lamotrigine isnt as effective as keppra. He was having convulsions and took keppra (he had a few left) and he was better. He doesn't think the new meds are working. ?

## 2022-04-13 NOTE — Telephone Encounter (Signed)
LMOVM for pt, ?We may need to increase the dose of the lamotrigine to '100mg'$  twice daily.  If he is agreeable to this, please send a prescription for lamotrigine '100mg'$  twice daily.  If he continues to have seizures on '100mg'$  twice daily, then I would discontinue and we can change back to Keppra.  ?

## 2022-04-13 NOTE — Telephone Encounter (Signed)
Patient returned call to Sheena. 

## 2022-04-13 NOTE — Progress Notes (Signed)
[facility] Counselor/Therapist Progress Note ? ?Patient ID: Francisco [name], MRN: [id number],   ? ?Date: 04/13/2022 ? ?Time Spent: 02:06 pm to 2:45 pm; total time: 39 minutes ? ? This session was held via in person. The patient consented to in-person therapy and was in the clinician's office. Limits of confidentiality were discussed with the patient.  ? ?Treatment Type: Individual Therapy ? ?Reported Symptoms: Irritability towards family dynamics over the years ? ?Mental Status Exam: ?Appearance:  Well Groomed     ?Behavior: Appropriate  ?Motor: Normal  ?Speech/Language:  Clear and Coherent  ?Affect: Appropriate  ?Mood: normal  ?Thought process: normal  ?Thought content:   WNL  ?Sensory/Perceptual disturbances:   WNL  ?Orientation: oriented to person, place, and time/date  ?Attention: Good  ?Concentration: Good  ?Memory: WNL  ?Fund of knowledge:  Good  ?Insight:   Good  ?Judgment:  Good  ?Impulse Control: Good  ? ?Risk Assessment: ?Danger to Self:  No ?Self-injurious Behavior: No ?Danger to Others: No ?Duty to Warn:no ?Physical Aggression / Violence:No  ?Access to Firearms a concern: No  ?Gang Involvement:No  ? ?Subjective: Beginning the session, the patient described himself as doing well while reflecting on events since the last session. Continuing to talk, patient completed a life review of his family dynamics. Several themes identified in the life review included the following: lack of reciprocity in relationships, putting others' before self, being sacrificial, and repairing of relationships. He voiced that he wants to reflect on putting his needs before others in the next session. He processed thoughts and emotions. He asked to follow up. He denied suicidal and homicidal ideation.   ? ? ?Interventions:  Worked on developing a therapeutic relationship with the patient using active listening and reflective statements. Provide emotional support using empathy and validation. Used summary  statements. Normalized and validated expressed thoughts and emotions. Challenged some of the thoughts expressed by the patient. Assisted the patient in doing a life review. Explored the different dynamics within the family system. Used socratic questions to assist the patient gain insight into self. Identified several different themes. Processed the different thoughts and emotions associated with the themes. Explored the idea of caring for self and what does that look like to the patient.. Assisted the patient gain insight into self. Assigned homework. Assessed for suicidal and homicidal ideation.  ? ?Homework: Reflect on caring for self and putting self's needs before others.  ? ?Next Session: Review homework and emotional support ? ?Diagnosis: F33.0 major depressive affective disorder, recurrent, mild  ? ?Plan:  ? ?Goals ?Alleviate depressive symptoms ?Recognize, accept, and cope with depressive feelings ?Develop healthy thinking patterns ?Develop healthy interpersonal relationships ? ?Objectives target date for all objectives is 02/27/2023 ?Cooperate with a medication evaluation by a physician ?Verbalize an accurate understanding of depression ?Verbalize an understanding of the treatment ?Identify and replace thoughts that support depression ?Learn and implement behavioral strategies ?Verbalize an understanding and resolution of current interpersonal problems ?Learn and implement problem solving and decision making skills ?Learn and implement conflict resolution skills to resolve interpersonal problems ?Verbalize an understanding of healthy and unhealthy emotions verbalize insight into how past relationships may be influence current experiences with depression ?Use mindfulness and acceptance strategies and increase value based behavior  ?Increase hopeful statements about the future.  ?Interventions ?Consistent with treatment model, discuss how change in cognitive, behavioral, and interpersonal can help client  alleviate depression ?CBT ?Behavioral activation help the client explore the relationship, nature of the dispute,  ?Help the  client develop new interpersonal skills and relationships ?Conduct Problem so living therapy ?Teach conflict resolution skills ?Use a process-experiential approach ?Conduct TLDP ?Conduct ACT ?Evaluate need for psychotropic medication ?Monitor adherence to medication  ? ?The patient and clinician reviewed the treatment plan on 03/20/2022. The patient approved of the treatment plan.  ? ? ?Conception Chancy, PsyD ? ?

## 2022-04-14 ENCOUNTER — Telehealth: Payer: Self-pay | Admitting: Pharmacist

## 2022-04-14 DIAGNOSIS — J849 Interstitial pulmonary disease, unspecified: Secondary | ICD-10-CM

## 2022-04-14 MED ORDER — LAMOTRIGINE 100 MG PO TABS: ORAL_TABLET | ORAL | 0 refills | Status: DC

## 2022-04-14 NOTE — Telephone Encounter (Signed)
Spoke to patient, Per Dr.Jaffe, ?We may need to increase the dose of the lamotrigine to '100mg'$  twice daily.  If he is agreeable to this, please send a prescription for lamotrigine '100mg'$  twice daily.  If he continues to have seizures on '100mg'$  twice daily, then I would discontinue and we can change back to Keppra. ? ?Pt agrees to try the increase Lamotrigine. ? ?lamotrigine to '100mg'$  twice daily sent to pharmacy ? ? ? ? ?

## 2022-04-14 NOTE — Telephone Encounter (Signed)
Received fax from [facility] requesting refill for patient's Esbriet.  ? ?Patient states he has been taking 3 tablets ('801mg'$ ) three times daily without side effects. He reports initial side effects when he started but they have subsided since. Reviewed the possibility of switching him to higher-dose '801mg'$  tablets to reduce pill burden. He is agreeable. ? ?He will stop by the clinic tomorrow morning after 8:30a for labs. Hepatic functional panel order is already placed. ? ?Will send refill after labs result tomorrow ? ?Knox Saliva, PharmD, MPH, BCPS ?Clinical Pharmacist (Rheumatology and Pulmonology) ?

## 2022-04-14 NOTE — Telephone Encounter (Deleted)
Cocktail was given by Burnett Kanaris, Noted in chart, closing out encounter  ?

## 2022-04-15 ENCOUNTER — Other Ambulatory Visit (INDEPENDENT_AMBULATORY_CARE_PROVIDER_SITE_OTHER): Payer: Medicare Other

## 2022-04-15 ENCOUNTER — Encounter: Payer: Self-pay | Admitting: Orthopedic Surgery

## 2022-04-15 DIAGNOSIS — Z5181 Encounter for therapeutic drug level monitoring: Secondary | ICD-10-CM

## 2022-04-15 LAB — HEPATIC FUNCTION PANEL
ALT: 18 U/L (ref 0–53)
AST: 20 U/L (ref 0–37)
Albumin: 4.4 g/dL (ref 3.5–5.2)
Alkaline Phosphatase: 85 U/L (ref 39–117)
Bilirubin, Direct: 0.2 mg/dL (ref 0.0–0.3)
Total Bilirubin: 1.2 mg/dL (ref 0.2–1.2)
Total Protein: 7.9 g/dL (ref 6.0–8.3)

## 2022-04-15 MED ORDER — PIRFENIDONE 801 MG PO TABS: 801.0000 mg | ORAL_TABLET | Freq: Three times a day (TID) | ORAL | 1 refills | Status: DC

## 2022-04-15 NOTE — Telephone Encounter (Signed)
Refill for Esbriet 801 mg three times daily sent to [facility]. Changed patient to higher dose tablet to reduce pill burden - patient aware because I called him yesterday afternoon ? ?LFT wnl on 04/15/22 ? ?Knox Saliva, PharmD, MPH, BCPS ?Clinical Pharmacist (Rheumatology and Pulmonology) ?

## 2022-04-15 NOTE — Addendum Note (Signed)
Addended by: Cassandria Anger on: 04/15/2022 01:13 PM ? ? Modules accepted: Orders ? ?

## 2022-04-16 ENCOUNTER — Ambulatory Visit
Admission: RE | Admit: 2022-04-16 | Discharge: 2022-04-16 | Disposition: A | Discharge status: Home / Self Care | Payer: Medicare Other | Source: Ambulatory Visit | Attending: Orthopedic Surgery | Admitting: Orthopedic Surgery

## 2022-04-16 ENCOUNTER — Ambulatory Visit (INDEPENDENT_AMBULATORY_CARE_PROVIDER_SITE_OTHER): Payer: Medicare Other | Admitting: Orthopedic Surgery

## 2022-04-16 VITALS — BP 118/72 | HR 79 | Temp 96.9°F | Ht 71.0 in | Wt 202.0 lb

## 2022-04-16 DIAGNOSIS — G4709 Other insomnia: Secondary | ICD-10-CM | POA: Diagnosis not present

## 2022-04-16 DIAGNOSIS — M542 Cervicalgia: Secondary | ICD-10-CM | POA: Diagnosis not present

## 2022-04-16 DIAGNOSIS — J84112 Idiopathic pulmonary fibrosis: Secondary | ICD-10-CM | POA: Diagnosis not present

## 2022-04-16 MED ORDER — ALBUTEROL SULFATE HFA 108 (90 BASE) MCG/ACT IN AERS: 2.0000 | INHALATION_SPRAY | Freq: Four times a day (QID) | RESPIRATORY_TRACT | 0 refills | Status: DC | PRN

## 2022-04-16 MED ORDER — METHOCARBAMOL 500 MG PO TABS: 250.0000 mg | ORAL_TABLET | Freq: Four times a day (QID) | ORAL | 0 refills | Status: DC

## 2022-04-16 NOTE — Telephone Encounter (Signed)
Unable to escribe due to IT error. Called in verbal rx for Esbriet '801mg'$  three times daily to prevent delay in patient receiving refill ? ?Pharmacy: [facility] ? ?Knox Saliva, PharmD, MPH, BCPS ?Clinical Pharmacist (Rheumatology and Pulmonology) ?

## 2022-04-16 NOTE — Patient Instructions (Signed)
Neck pillow ? ?Try muscle relaxer and advil together as needed ? ?[facility] - [facility] [address]- please get xray  ? ?Massage at [facility]  ? ?May use voltaren gel instead of advil to help with neck pain  ?
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
## 2022-04-20 ENCOUNTER — Telehealth: Payer: Self-pay | Admitting: Neurology

## 2022-04-20 NOTE — Telephone Encounter (Signed)
Pt called in stating since he has been off of the [place], he has fallen 5 times this last week. Three of those times was Saturday. He doesn't think the lamotrigine is working well. ?
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
## 2022-04-20 NOTE — Telephone Encounter (Signed)
Pt decided to schedule an appt for tomorrow. 04/21/22 ?

## 2022-04-21 ENCOUNTER — Ambulatory Visit: Payer: Medicare Other | Admitting: Neurology

## 2022-04-21 ENCOUNTER — Encounter: Payer: Self-pay | Admitting: Neurology

## 2022-04-21 VITALS — BP 112/66 | HR 67 | Ht 72.0 in | Wt 202.6 lb

## 2022-04-21 DIAGNOSIS — M7918 Myalgia, other site: Secondary | ICD-10-CM | POA: Diagnosis not present

## 2022-04-21 DIAGNOSIS — G40309 Generalized idiopathic epilepsy and epileptic syndromes, not intractable, without status epilepticus: Secondary | ICD-10-CM | POA: Diagnosis not present

## 2022-04-21 MED ORDER — LEVETIRACETAM 500 MG PO TABS: 500.0000 mg | ORAL_TABLET | Freq: Two times a day (BID) | ORAL | 5 refills | Status: DC

## 2022-04-21 NOTE — Patient Instructions (Addendum)
Restart levetiracetam '500mg'$  twice daily ?Taper off lamotrigine - take 1/2 tablet twice daily for one week, then 1/2 tablet at bedtime for one week, then STOP ?Follow up 6 months. ?

## 2022-05-08 ENCOUNTER — Other Ambulatory Visit: Payer: Self-pay | Admitting: Neurology

## 2022-05-08 ENCOUNTER — Other Ambulatory Visit: Payer: Self-pay | Admitting: Orthopedic Surgery

## 2022-05-08 DIAGNOSIS — J84112 Idiopathic pulmonary fibrosis: Secondary | ICD-10-CM

## 2022-05-15 ENCOUNTER — Ambulatory Visit: Payer: Medicare Other | Admitting: Psychologist

## 2022-06-09 ENCOUNTER — Ambulatory Visit: Payer: Medicare Other | Admitting: Pulmonary Disease

## 2022-06-09 ENCOUNTER — Encounter: Payer: Self-pay | Admitting: Pulmonary Disease

## 2022-06-09 VITALS — BP 118/56 | HR 56 | Temp 98.3°F | Ht 72.0 in | Wt 200.0 lb

## 2022-06-09 DIAGNOSIS — R0600 Dyspnea, unspecified: Secondary | ICD-10-CM

## 2022-06-09 DIAGNOSIS — J849 Interstitial pulmonary disease, unspecified: Secondary | ICD-10-CM | POA: Diagnosis not present

## 2022-06-09 DIAGNOSIS — Z5181 Encounter for therapeutic drug level monitoring: Secondary | ICD-10-CM

## 2022-06-09 LAB — HEPATIC FUNCTION PANEL
ALT: 18 U/L (ref 0–53)
AST: 26 U/L (ref 0–37)
Albumin: 4 g/dL (ref 3.5–5.2)
Alkaline Phosphatase: 98 U/L (ref 39–117)
Bilirubin, Direct: 0.3 mg/dL (ref 0.0–0.3)
Total Bilirubin: 1.4 mg/dL — ABNORMAL HIGH (ref 0.2–1.2)
Total Protein: 7.5 g/dL (ref 6.0–8.3)

## 2022-06-09 LAB — COMPREHENSIVE METABOLIC PANEL
ALT: 18 U/L (ref 0–53)
AST: 26 U/L (ref 0–37)
Albumin: 4 g/dL (ref 3.5–5.2)
Alkaline Phosphatase: 98 U/L (ref 39–117)
BUN: 9 mg/dL (ref 6–23)
CO2: 23 mEq/L (ref 19–32)
Calcium: 10.1 mg/dL (ref 8.4–10.5)
Chloride: 107 mEq/L (ref 96–112)
Creatinine, Ser: 0.67 mg/dL (ref 0.40–1.50)
GFR: 97.46 mL/min (ref >60.00)
Glucose, Bld: 95 mg/dL (ref 70–99)
Potassium: 4 mEq/L (ref 3.5–5.1)
Sodium: 137 mEq/L (ref 135–145)
Total Bilirubin: 1.4 mg/dL — ABNORMAL HIGH (ref 0.2–1.2)
Total Protein: 7.5 g/dL (ref 6.0–8.3)

## 2022-06-09 MED ORDER — TRELEGY ELLIPTA 200-62.5-25 MCG/ACT IN AEPB: 1.0000 | INHALATION_SPRAY | Freq: Every day | RESPIRATORY_TRACT | 5 refills | Status: DC

## 2022-06-09 NOTE — Progress Notes (Signed)
Francisco [name]    [id number]    Aug 07, 1956  Primary Care Physician:[place], Amy E, NP  Referring Physician: Yvonna Alanis, NP [address],  [place] [address]  Chief complaint:  Follow-up for IPF Started Esbriet March 7248  HPI: 66 year old with history of allergies, hepatitis C, cirrhosis referred for evaluation of abnormal finding seen on CT scan. Screening CT in early 2022 showed mild subpleural reticulation.  Follow-up high-resolution CT in July 2022 showed pulmonary fibrosis with honeycombing and has been referred here for further evaluation next Has complaints of chest congestion, no dyspnea, has small joint pain, stiffness Has strong family history of CTD with rheumatoid arthritis in father and ankylosing spondylitis and brother He has consulted with Dr. Benjamine Mola, rheumatology in February 2023 who does not feel there is any evidence of connective tissue disease.  He has history of cirrhosis secondary to hepatitis C which was treated with Harvoni.  Pets: No pets Occupation: Worked as a Freight forwarder for a [employer], community.  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  No mold, hot tub, Jacuzzi.  No feather pillows or comforters.  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  He has started Esbriet in March of this year.  Tolerating it well without any issue  He was given Trelegy inhaler at last visit.  He feels that this has helped but he does not have a prescription to continue therapy  Outpatient Encounter Medications as of  06/09/2022  Medication Sig   albuterol (VENTOLIN HFA) 108 (90 Base) MCG/ACT inhaler TAKE 2 PUFFS BY MOUTH EVERY 6 HOURS AS NEEDED FOR WHEEZE OR SHORTNESS OF BREATH   ibuprofen (ADVIL) 200 MG tablet Take 200 mg by mouth as needed.   levETIRAcetam (KEPPRA) 500 MG tablet Take 1 tablet (500 mg total) by mouth 2 (two) times daily.   Multiple Vitamins-Minerals (MULTIVITAMIN WITH MINERALS) tablet Take 1 tablet by mouth daily.   Pirfenidone (ESBRIET) 801 MG TABS Take 801 mg by mouth 3 (three) times daily with meals.   Fluticasone-Umeclidin-Vilant (TRELEGY ELLIPTA) 200-62.5-25 MCG/ACT AEPB Inhale 1 puff into the lungs daily. (Patient not taking: Reported on 06/09/2022)   methocarbamol (ROBAXIN) 500 MG tablet Take 0.5 tablets (250 mg total) by mouth 4 (four) times daily. (Patient not taking: Reported on 06/09/2022)   No facility-administered encounter medications on file as of 06/09/2022.    Physical Exam: Gen:      No acute distress HEENT:  EOMI, sclera anicteric Neck:     No masses; no thyromegaly Lungs:    Bibasal crackles CV:         Regular rate and rhythm; no murmurs Abd:      + bowel sounds; soft, non-tender; no palpable masses, no distension Ext:    Clubbing; adequate peripheral perfusion Skin:      Warm and dry; no rash Neuro: alert and oriented x 3 Psych: normal mood and affect   Data Reviewed: Imaging: CT lung cancer screening 04/05/2021-subpleural reticulation, calcified granuloma  High-resolution CT 06/27/2021-mild groundglass attenuation, subpleural reticulation with honeycombing most evident at the anterior aspect of the upper lobe, advanced cirrhosis I have  reviewed the images personally  PFTs: 01/08/2022 FVC 4.49 [90%], FEV1 3.66 [98%], F/F 81, TLC 5.92 [79%], DLCO 15.19 [53%] Minimal restriction, moderate-severe diffusion defect  Labs: CTD serologies 08/18/2021-ANA 1:80, cytoplasmic, rheumatoid factor 16, and anti Jo 1-20  Assessment:  Follow-up for interstitial lung  disease Case reviewed at multidisciplinary conference in September 2022 CT shows moderate patchy reticulation with traction bronchiectasis, honeycombing with basilar predominance Although there is increased disease in the anterior aspect of the upper lobe this process is felt to be more bullous emphysema and not honeycombing.  Pattern is still consistent with UIP by ATS criteria and this could be IPF in the right clinical setting especially given significant clubbing  He does have strong family history of connective tissue disease with positive ANA, rheumatoid factor and Jo 1.  Rheumatology evaluation noted with no evidence of autoimmune process No significant exposures noted  Continue Esbriet.  Monitor labs He has history of hepatitis C, cirrhosis and will need close monitoring.  Emphysema He has significant emphysema on CT scan though no obstruction on spirometry.  Suspect that he does not have obstruction due to combined presence of emphysema and pulmonary fibrosis Trelegy has helped and we will send in a prescription to continue therapy  Plan/Recommendations: Continue Esbriet, check labs for monitoring Continue Trelegy  Marshell Garfinkel MD  Pulmonary and Critical Care 06/09/2022, 1:39 PM  CC: Yvonna Alanis, NP

## 2022-06-09 NOTE — Patient Instructions (Signed)
I am glad you are doing well with your breathing.  We will check labs today You are due for follow-up CT scan in 3 months.  Will order this test and follow-up in clinic in 3 months for review.

## 2022-06-10 LAB — PRO B NATRIURETIC PEPTIDE: NT-Pro BNP: 61 pg/mL (ref 0–376)

## 2022-06-16 ENCOUNTER — Other Ambulatory Visit: Payer: Self-pay | Admitting: Orthopedic Surgery

## 2022-06-16 DIAGNOSIS — M542 Cervicalgia: Secondary | ICD-10-CM

## 2022-06-24 ENCOUNTER — Other Ambulatory Visit: Payer: Self-pay

## 2022-06-24 ENCOUNTER — Telehealth: Payer: Self-pay

## 2022-06-24 DIAGNOSIS — B182 Chronic viral hepatitis C: Secondary | ICD-10-CM

## 2022-06-24 NOTE — Telephone Encounter (Signed)
Received reminder for repeat US: Pt ordered and scheduled for an [name] at [facility] on 07/01/2022 at 9:30 AM: Pt check in at 9:15: Nothing to eat or drink 8 hours prior: Pt made aware:  Pt verbalized understanding with all questions answered.

## 2022-06-24 NOTE — Telephone Encounter (Signed)
-----   Message from Gillermina Hu, RN sent at 12/24/2021  9:53 AM EST ----- Regarding: [name] Gatha Mayer, MD  Gillermina Hu, RN [name] is stable w/o new problems or signs of tumors - let him know please   1) Repeat US 6 mos - same indication/dx ( Hepatic Cirrhosis due to Chronic Hepatitis C Infection)  2) schedule Visit me next available not urgent (Feb is fine)   12/24/2020

## 2022-07-01 ENCOUNTER — Ambulatory Visit (HOSPITAL_COMMUNITY)
Admission: RE | Admit: 2022-07-01 | Discharge: 2022-07-01 | Disposition: A | Discharge status: Home / Self Care | Payer: Medicare Other | Source: Ambulatory Visit | Attending: Internal Medicine | Admitting: Internal Medicine

## 2022-07-01 DIAGNOSIS — K7460 Unspecified cirrhosis of liver: Secondary | ICD-10-CM | POA: Diagnosis not present

## 2022-07-01 DIAGNOSIS — B182 Chronic viral hepatitis C: Secondary | ICD-10-CM | POA: Diagnosis present

## 2022-07-01 DIAGNOSIS — K7689 Other specified diseases of liver: Secondary | ICD-10-CM | POA: Diagnosis not present

## 2022-07-01 DIAGNOSIS — K8020 Calculus of gallbladder without cholecystitis without obstruction: Secondary | ICD-10-CM | POA: Diagnosis not present

## 2022-07-01 DIAGNOSIS — R161 Splenomegaly, not elsewhere classified: Secondary | ICD-10-CM | POA: Diagnosis not present

## 2022-08-19 ENCOUNTER — Encounter (HOSPITAL_COMMUNITY): Payer: Self-pay

## 2022-08-19 ENCOUNTER — Ambulatory Visit (HOSPITAL_COMMUNITY)
Admission: EM | Admit: 2022-08-19 | Discharge: 2022-08-19 | Disposition: A | Discharge status: Home / Self Care | Payer: Medicare Other | Attending: Family Medicine | Admitting: Family Medicine

## 2022-08-19 ENCOUNTER — Ambulatory Visit (INDEPENDENT_AMBULATORY_CARE_PROVIDER_SITE_OTHER): Payer: Medicare Other

## 2022-08-19 DIAGNOSIS — M545 Low back pain, unspecified: Secondary | ICD-10-CM

## 2022-08-19 DIAGNOSIS — M4316 Spondylolisthesis, lumbar region: Secondary | ICD-10-CM | POA: Diagnosis not present

## 2022-08-19 DIAGNOSIS — W19XXXA Unspecified fall, initial encounter: Secondary | ICD-10-CM

## 2022-08-19 DIAGNOSIS — R42 Dizziness and giddiness: Secondary | ICD-10-CM | POA: Diagnosis not present

## 2022-08-19 DIAGNOSIS — G40309 Generalized idiopathic epilepsy and epileptic syndromes, not intractable, without status epilepticus: Secondary | ICD-10-CM | POA: Diagnosis not present

## 2022-08-19 DIAGNOSIS — M5136 Other intervertebral disc degeneration, lumbar region: Secondary | ICD-10-CM | POA: Diagnosis not present

## 2022-08-19 NOTE — ED Triage Notes (Signed)
Pt states had a seizure 2 wks ago and fell hitting his rt lower back on the curb. States taking OTC with no relief.    Pt c/o pain rt side of neck down from skull for a few months. States has had feelings while driving that he is going to pass out.

## 2022-08-19 NOTE — ED Provider Notes (Signed)
[facility]    CSN: [id number] Arrival date & time: 08/19/22  1700      History   Chief Complaint Chief Complaint  Patient presents with   Fall   Back Pain    HPI Francisco [name] is a 66 y.o. male.   Patient here today for evaluation of lower back pain at the base of his lumbar spine radiating to the right since having a seizure 2 weeks ago which caused a fall where he hit his back on a curb. He has been taking OTC meds without significant relief.   He is also concerned because he has been having a sensation that he will "pass out". This is not seemingly related to position or activity- he reports when he was lying down on the exam table earlier he had an episode of lightheadedness that resolved spontaneously. Seems to occur more with sitting rather than standing. He is most concerned with this while driving. He notes some pain down the right side of his neck that radiates from his skull that he has noted for a few months. He denies any nausea or vomiting.  He does have history of anemia but denies any blood in his stool or dark tarry stools or other abnormal bleeding.   The history is provided by the patient.    Past Medical History:  Diagnosis Date   Allergy to [place] pig dander    Arthritis    "hands and knees the most; mild" (03/15/2017)   Benign neoplasm of colon 07/19/2014   07/19/14 - 2 adenomas - repeat colonoscopy 2020    Cirrhosis ([place])    Diverticulosis 2008   Epilepsy ([place])    Esophageal varices with bleeding ([place]) 07/19/2014   Falls related to headaches    Fatty liver 2011   Abd [name]   Headaches thought due to old head injury    Hepatic cirrhosis due to chronic hepatitis C infection ([place]) and some component of alcohol use 07/19/2014   Hepatitis C    History of blood transfusion 1987   "related to the zygomatic arch fracture"   Iron deficiency anemia secondary to blood loss (chronic) 07/19/2014   Portal hypertensive gastropathy ([place]) 07/19/2014    Scoliosis    Seizures ([place])    Torn rotator cuff     Patient Active Problem List   Diagnosis Date Noted   Idiopathic pulmonary fibrosis ([place]) 02/16/2022   Positive ANA (antinuclear antibody) 02/16/2022   Rheumatoid factor positive 02/16/2022   Thrombocytopenia ([place]) 01/21/2022   Colon cancer screening    Umbilical hernia without obstruction and without gangrene 05/14/2021   Bleeding esophageal varices (HCC)    Portal hypertension ([place])    History of alcoholism ([place])    History of hepatitis C    Repeated falls 08/31/2016   Hepatic cirrhosis ([place]) 01/31/2015   Portal hypertensive gastropathy ([place]) 07/19/2014   Hepatic cirrhosis due to chronic hepatitis C infection ([place]) and some component of alcohol use 07/19/2014   Personal history of colonic polyps 07/19/2014   Iron deficiency anemia due to chronic blood loss 07/19/2014    Past Surgical History:  Procedure Laterality Date   ABDOMINAL SURGERY     Knife puncture to Stomach   COLONOSCOPY  2008   COLONOSCOPY N/A 07/19/2014   Procedure: COLONOSCOPY;  Surgeon: Gatha Mayer, MD;  Location: [place];  Service: Endoscopy;  Laterality: N/A;   COLONOSCOPY WITH PROPOFOL N/A 06/19/2021   Procedure: COLONOSCOPY WITH PROPOFOL;  Surgeon: Gatha Mayer, MD;  Location: [facility];  Service: Endoscopy;  Laterality: N/A;   ESOPHAGEAL BANDING  04/14/2017   Procedure: ESOPHAGEAL BANDING;  Surgeon: Gatha Mayer, MD;  Location: [facility];  Service: Endoscopy;;   ESOPHAGOGASTRODUODENOSCOPY N/A 07/19/2014   Procedure: ESOPHAGOGASTRODUODENOSCOPY (EGD);  Surgeon: Gatha Mayer, MD;  Location[facility] END[facility]COPY;  Service: Endoscopy;  Laterality: N/A;   ESOPHAGOGASTRODUODENOSCOPY N/A 09/05/2014   Procedure: ESOPHAGOGASTRODUODENOSCOPY (EGD);  Surgeon: Gatha Mayer, MD;  Location: [facility];  Service: Endoscopy;  Laterality: N/A;   ESOPHAGOGASTRODUODENOSCOPY N/A 10/17/2014   Procedure: ESOPHAGOGASTRODUODENOSCOPY (EGD);  Surgeon: Gatha Mayer,  MD;  Location: [facility];  Service: Endoscopy;  Laterality: N/A;   ESOPHAGOGASTRODUODENOSCOPY N/A 12/19/2014   Procedure: ESOPHAGOGASTRODUODENOSCOPY (EGD);  Surgeon: Gatha Mayer, MD;  Location: [facility];  Service: Endoscopy;  Laterality: N/A;   ESOPHAGOGASTRODUODENOSCOPY N/A 03/16/2017   Procedure: ESOPHAGOGASTRODUODENOSCOPY (EGD);  Surgeon: Ladene Artist, MD;  Location: [facility] END[facility]COPY;  Service: Endoscopy;  Laterality: N/A;   ESOPHAGOGASTRODUODENOSCOPY (EGD) WITH PROPOFOL N/A 11/21/2015   Procedure: ESOPHAGOGASTRODUODENOSCOPY (EGD) WITH PROPOFOL;  Surgeon: Gatha Mayer, MD;  Location: [place];  Service: Endoscopy;  Laterality: N/A;   ESOPHAGOGASTRODUODENOSCOPY (EGD) WITH PROPOFOL N/A 04/14/2017   Procedure: ESOPHAGOGASTRODUODENOSCOPY (EGD) WITH PROPOFOL;  Surgeon: Gatha Mayer, MD;  Location: [facility];  Service: Endoscopy;  Laterality: N/A;   ESOPHAGOGASTRODUODENOSCOPY (EGD) WITH PROPOFOL N/A 07/23/2017   Procedure: ESOPHAGOGASTRODUODENOSCOPY (EGD) WITH PROPOFOL;  Surgeon: Gatha Mayer, MD;  Location: [facility];  Service: Endoscopy;  Laterality: N/A;   ESOPHAGOGASTRODUODENOSCOPY (EGD) WITH PROPOFOL N/A 06/19/2021   Procedure: ESOPHAGOGASTRODUODENOSCOPY (EGD) WITH PROPOFOL;  Surgeon: Gatha Mayer, MD;  Location: [facility];  Service: Endoscopy;  Laterality: N/A;   FRACTURE SURGERY Right    cheek   GASTRIC VARICES BANDING N/A 10/17/2014   Procedure: GASTRIC VARICES BANDING;  Surgeon: Gatha Mayer, MD;  Location: [facility];  Service: Endoscopy;  Laterality: N/A;   ORIF ZYGOMATIC FRACTURE Right 1987   RADIUS SYNOSTOSIS PROXIMAL RESECTION W/ ALLOGRAFT Right 1980s   at the elbow   THYROIDECTOMY, PARTIAL Left 2012       Home Medications    Prior to Admission medications   Medication Sig Start Date End Date Taking? Authorizing Provider  albuterol (VENTOLIN HFA) 108 (90 Base) MCG/ACT inhaler TAKE 2 PUFFS BY MOUTH EVERY 6 HOURS AS NEEDED FOR WHEEZE OR SHORTNESS  OF BREATH 05/08/22   F[place], Amy E, NP  Fluticasone-Umeclidin-Vilant (TRELEGY ELLIPTA) 200-62.5-25 MCG/ACT AEPB Inhale 1 puff into the lungs daily. 06/09/22   Mannam, Hart Robinsons, MD  ibuprofen (ADVIL) 200 MG tablet Take 200 mg by mouth as needed.    [provider]  levETIRAcetam (KEPPRA) 500 MG tablet Take 1 tablet (500 mg total) by mouth 2 (two) times daily. 04/21/22   Tomi Likens, Adam R, DO  methocarbamol (ROBAXIN) 500 MG tablet TAKE 0.5 TABLETS (250 MG TOTAL) BY MOUTH 4 (FOUR) TIMES DAILY. 06/16/22   Yvonna Alanis, NP  Multiple Vitamins-Minerals (MULTIVITAMIN WITH MINERALS) tablet Take 1 tablet by mouth daily.    [provider]  Pirfenidone (ESBRIET) 801 MG TABS Take 801 mg by mouth 3 (three) times daily with meals. 04/15/22   Cassandria Anger, RPH-CPP    Family History Family History  Problem Relation Age of Onset   Breast cancer Mother 74   Crohn's disease Father 41   Arthritis/Rheumatoid Father    Hearing loss Father    Epilepsy Sister 63   Ankylosing spondylitis Brother 58   Heart disease Maternal Grandmother  Alcohol abuse Maternal Grandfather    Cancer Paternal Grandfather        type unknown, mets   Alcohol abuse Maternal Uncle        several   Depression Daughter 36    Social History Social History   Tobacco Use   Smoking status: Former    Packs/day: 0.50    Years: 47.00    Total pack years: 23.50    Types: Cigarettes    Start date: 05/08/1974    Quit date: 03/04/2022    Years since quitting: 0.4   Smokeless tobacco: Never   Tobacco comments:    Stopped 03/04/22  Vaping Use   Vaping Use: Never used  Substance Use Topics   Alcohol use: No    Comment: 03/15/2017 "used to drink considerably; quit 08/27/14"   Drug use: No     Allergies   Naproxen, Nsaids, and Other   Review of Systems Review of Systems  Constitutional:  Negative for chills and fever.  Eyes:  Negative for discharge and redness.  Respiratory:  Negative for shortness of breath.    Cardiovascular:  Negative for chest pain.  Gastrointestinal:  Negative for nausea and vomiting.  Musculoskeletal:  Positive for back pain.  Neurological:  Positive for light-headedness. Negative for weakness and numbness.     Physical Exam Triage Vital Signs ED Triage Vitals  Enc Vitals Group     BP      Pulse      Resp      Temp      Temp src      SpO2      Weight      Height      Head Circumference      Peak Flow      Pain Score      Pain Loc      Pain Edu?      Excl. in [place]?    No data found.  Updated Vital Signs BP 133/72 (BP Location: Left Arm)   Pulse 87   Temp 97.9 F (36.6 C) (Oral)   Resp 18   SpO2 97%      Physical Exam Vitals and nursing note reviewed.  Constitutional:      General: He is not in acute distress.    Appearance: Normal appearance. He is not ill-appearing.  HENT:     Head: Normocephalic and atraumatic.  Eyes:     Extraocular Movements: Extraocular movements intact.     Conjunctiva/sclera: Conjunctivae normal.     Pupils: Pupils are equal, round, and reactive to light.  Cardiovascular:     Rate and Rhythm: Normal rate.  Pulmonary:     Effort: Pulmonary effort is normal. No respiratory distress.  Neurological:     Mental Status: He is alert and oriented to person, place, and time.  Psychiatric:        Mood and Affect: Mood normal.        Behavior: Behavior normal.        Thought Content: Thought content normal.      UC Treatments / Results  Labs (all labs ordered are listed, but only abnormal results are displayed) Labs Reviewed - No data to display  EKG   Radiology DG Lumbar Spine Complete  Result Date: 08/19/2022 CLINICAL DATA:  Patient states he had a seizure 2 weeks ago and fell, hitting his right lower back on curb. EXAM: LUMBAR SPINE - COMPLETE 4+ VIEW COMPARISON:  Lumbar spine radiograph 06/26/2015 FINDINGS: There are  5 lumbar-type vertebral bodies. There is no evidence of lumbar spine fracture. Trace  retrolisthesis of L3 on L4, unchanged compared to prior exam. Anterior wedging of the L1 vertebral body is also unchanged compared to prior exam. Multilevel moderate-to-severe degenerative disc disease throughout the lumbar spine, most severe at L2-L3 and L4-L5 with loss of disc space height and marginal osteophytes. Bilateral hypertrophic facet arthropathy of the lower lumbar levels. IMPRESSION: No fracture or traumatic malalignment of the lumbar spine. Multilevel degenerative changes as described in the body of the report and not significantly changed compared to prior exam 06/26/2015. Electronically Signed   By: Ileana Roup M.D.   On: 08/19/2022 19:17    Procedures Procedures (including critical care time)  Medications Ordered in UC Medications - No data to display  Initial Impression / Assessment and Plan / UC Course  I have reviewed the triage vital signs and the nursing notes.  Pertinent labs & imaging results that were available during my care of the patient were reviewed by me and considered in my medical decision making (see chart for details).    Xray of lumbar spine without acute abnormality. Discussed options for further work up of lightheadedness and that we are limited at this facility regarding imaging, etc--offered ED tonight vs follow up call with neurology tomorrow given longstanding epilepsy, possible neurologic cause of symptoms. Patient prefers to follow up with neurology, will call neuro office tomorrow morning for further recommendation. Patient is certainly stable in office and seems an appropriate candidate for outpatient follow up. Encouraged sooner follow up with any concerns.  Final Clinical Impressions(s) / UC Diagnoses   Final diagnoses:  Acute right-sided low back pain without sciatica  Episodic lightheadedness  Nonintractable generalized idiopathic epilepsy without status epilepticus [facility])   Discharge Instructions   None    ED Prescriptions   None     PDMP not reviewed this encounter.   Francene Finders, PA-C 08/19/22 1940

## 2022-08-20 ENCOUNTER — Encounter: Payer: Self-pay | Admitting: Orthopedic Surgery

## 2022-08-20 ENCOUNTER — Ambulatory Visit (INDEPENDENT_AMBULATORY_CARE_PROVIDER_SITE_OTHER): Payer: Medicare Other | Admitting: Orthopedic Surgery

## 2022-08-20 VITALS — BP 138/88 | HR 52 | Temp 94.2°F | Resp 20 | Ht 72.0 in | Wt 199.8 lb

## 2022-08-20 DIAGNOSIS — G40309 Generalized idiopathic epilepsy and epileptic syndromes, not intractable, without status epilepticus: Secondary | ICD-10-CM

## 2022-08-20 DIAGNOSIS — R42 Dizziness and giddiness: Secondary | ICD-10-CM

## 2022-08-20 DIAGNOSIS — F32A Depression, unspecified: Secondary | ICD-10-CM

## 2022-08-20 DIAGNOSIS — M542 Cervicalgia: Secondary | ICD-10-CM | POA: Diagnosis not present

## 2022-08-20 DIAGNOSIS — F419 Anxiety disorder, unspecified: Secondary | ICD-10-CM

## 2022-08-20 DIAGNOSIS — J84112 Idiopathic pulmonary fibrosis: Secondary | ICD-10-CM

## 2022-08-20 MED ORDER — PREDNISONE 10 MG PO TABS: ORAL_TABLET | ORAL | 0 refills | Status: AC
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
## 2022-08-20 NOTE — Patient Instructions (Addendum)
Prednisone taper for neck pain  Take Advil on empty stomach  Follow up with neurology about seizure and chronic neck pain  Continue sessions with [facility]

## 2022-08-20 NOTE — Progress Notes (Signed)
Careteam: Patient Care Team: Yvonna Alanis, NP as PCP - General (Adult Health Nurse Practitioner) Pieter Partridge, DO as Consulting Physician (Neurology)  Seen by: Windell Moulding, AGNP-C  PLACE OF SERVICE:  [place] Directive information    Allergies  Allergen Reactions   Naproxen Other (See Comments)    Esophageal bleeding   Nsaids     Avoid due to liver condition    Other     [place] Pig    No chief complaint on file.    HPI: Patient is a 66 y.o. male seen today for acute visit due to ongoing neck and back pain.   Increased right sided neck pain since 03/2022. 04/27 xray neck revealed narrowing C5-6, C6-7. Pain intermittent. Radiates down right back. No radiation to arms. 2 weeks ago he was moving lawn and reports seizure with fall. He fell and hit back of head and back. He has had increased neck and lower back pain since event. 08/30 he presented to the ED due to pain. He was advised to follow up with neurology. He has been taking keppra. He has been using advil prn for neck pain.   He has also been having periods of dizziness. Once while driving recently. He took deep breaths and symptoms subsided. He has another episode yesterday in ED. Denies signs on bleeding today.   Stopped seeing Dr. Michail Sermon for depression. He has been going to [facility] for counseling. Reports 2 episodes on binge drinking after he placed his father in retirement community. He also stopped smoking marijuana. He plans to be in counseling until October, 2023.     Review of Systems:  Review of Systems  Constitutional:  Negative for chills, fever, malaise/fatigue and weight loss.  HENT:  Negative for sore throat.   Eyes:  Negative for blurred vision and double vision.  Respiratory:  Negative for cough, sputum production, shortness of breath and wheezing.   Cardiovascular:  Negative for chest pain and leg swelling.  Gastrointestinal:  Negative for blood in stool.  Genitourinary:   Negative for dysuria and hematuria.  Musculoskeletal:  Positive for back pain, falls and neck pain.  Skin:  Negative for rash.  Neurological:  Positive for weakness. Negative for dizziness and tingling.  Psychiatric/Behavioral:  Positive for depression. Negative for memory loss and suicidal ideas. The patient is nervous/anxious. The patient does not have insomnia.     Past Medical History:  Diagnosis Date   Allergy to [place] pig dander    Arthritis    "hands and knees the most; mild" (03/15/2017)   Benign neoplasm of colon 07/19/2014   07/19/14 - 2 adenomas - repeat colonoscopy 2020    Cirrhosis ([place])    Diverticulosis 2008   Epilepsy ([place])    Esophageal varices with bleeding ([place]) 07/19/2014   Falls related to headaches    Fatty liver 2011   Abd [name]   Headaches thought due to old head injury    Hepatic cirrhosis due to chronic hepatitis C infection ([place]) and some component of alcohol use 07/19/2014   Hepatitis C    History of blood transfusion 1987   "related to the zygomatic arch fracture"   Iron deficiency anemia secondary to blood loss (chronic) 07/19/2014   Portal hypertensive gastropathy ([place]) 07/19/2014   Scoliosis    Seizures ([place])    Torn rotator cuff    Past Surgical History:  Procedure Laterality Date   ABDOMINAL SURGERY     Knife puncture to  Stomach   COLONOSCOPY  2008   COLONOSCOPY N/A 07/19/2014   Procedure: COLONOSCOPY;  Surgeon: Gatha Mayer, MD;  Location: [facility];  Service: Endoscopy;  Laterality: N/A;   COLONOSCOPY WITH PROPOFOL N/A 06/19/2021   Procedure: COLONOSCOPY WITH PROPOFOL;  Surgeon: Gatha Mayer, MD;  Location: [facility];  Service: Endoscopy;  Laterality: N/A;   ESOPHAGEAL BANDING  04/14/2017   Procedure: ESOPHAGEAL BANDING;  Surgeon: Gatha Mayer, MD;  Location: [facility];  Service: Endoscopy;;   ESOPHAGOGASTRODUODENOSCOPY N/A 07/19/2014   Procedure: ESOPHAGOGASTRODUODENOSCOPY (EGD);  Surgeon: Gatha Mayer, MD;  Location: [facility]  ENDOSCOPY;  Service: Endoscopy;  Laterality: N/A;   ESOPHAGOGASTRODUODENOSCOPY N/A 09/05/2014   Procedure: ESOPHAGOGASTRODUODENOSCOPY (EGD);  Surgeon: Gatha Mayer, MD;  Location: [facility];  Service: Endoscopy;  Laterality: N/A;   ESOPHAGOGASTRODUODENOSCOPY N/A 10/17/2014   Procedure: ESOPHAGOGASTRODUODENOSCOPY (EGD);  Surgeon: Gatha Mayer, MD;  Location: [facility];  Service: Endoscopy;  Laterality: N/A;   ESOPHAGOGASTRODUODENOSCOPY N/A 12/19/2014   Procedure: ESOPHAGOGASTRODUODENOSCOPY (EGD);  Surgeon: Gatha Mayer, MD;  Location: [facility];  Service: Endoscopy;  Laterality: N/A;   ESOPHAGOGASTRODUODENOSCOPY N/A 03/16/2017   Procedure: ESOPHAGOGASTRODUODENOSCOPY (EGD);  Surgeon: Ladene Artist, MD;  Location: [facility] ENDOSCOPY;  Service: Endoscopy;  Laterality: N/A;   ESOPHAGOGASTRODUODENOSCOPY (EGD) WITH PROPOFOL N/A 11/21/2015   Procedure: ESOPHAGOGASTRODUODENOSCOPY (EGD) WITH PROPOFOL;  Surgeon: Gatha Mayer, MD;  Location: [place];  Service: Endoscopy;  Laterality: N/A;   ESOPHAGOGASTRODUODENOSCOPY (EGD) WITH PROPOFOL N/A 04/14/2017   Procedure: ESOPHAGOGASTRODUODENOSCOPY (EGD) WITH PROPOFOL;  Surgeon: Gatha Mayer, MD;  Location: [facility];  Service: Endoscopy;  Laterality: N/A;   ESOPHAGOGASTRODUODENOSCOPY (EGD) WITH PROPOFOL N/A 07/23/2017   Procedure: ESOPHAGOGASTRODUODENOSCOPY (EGD) WITH PROPOFOL;  Surgeon: Gatha Mayer, MD;  Location: [facility];  Service: Endoscopy;  Laterality: N/A;   ESOPHAGOGASTRODUODENOSCOPY (EGD) WITH PROPOFOL N/A 06/19/2021   Procedure: ESOPHAGOGASTRODUODENOSCOPY (EGD) WITH PROPOFOL;  Surgeon: Gatha Mayer, MD;  Location: [facility];  Service: Endoscopy;  Laterality: N/A;   FRACTURE SURGERY Right    cheek   GASTRIC VARICES BANDING N/A 10/17/2014   Procedure: GASTRIC VARICES BANDING;  Surgeon: Gatha Mayer, MD;  Location: [facility];  Service: Endoscopy;  Laterality: N/A;   ORIF ZYGOMATIC FRACTURE Right 1987   RADIUS SYNOSTOSIS  PROXIMAL RESECTION W/ ALLOGRAFT Right 1980s   at the elbow   THYROIDECTOMY, PARTIAL Left 2012   Social History:   reports that he quit smoking about 5 months ago. His smoking use included cigarettes. He started smoking about 48 years ago. He has a 23.50 pack-year smoking history. He has never used smokeless tobacco. He reports that he does not drink alcohol and does not use drugs.  Family History  Problem Relation Age of Onset   Breast cancer Mother 69   Crohn's disease Father 60   Arthritis/Rheumatoid Father    Hearing loss Father    Epilepsy Sister 15   Ankylosing spondylitis Brother 14   Heart disease Maternal Grandmother    Alcohol abuse Maternal Grandfather    Cancer Paternal Grandfather        type unknown, mets   Alcohol abuse Maternal Uncle        several   Depression Daughter 72    Medications: Patient's Medications  New Prescriptions   No medications on file  Previous Medications   ALBUTEROL (VENTOLIN HFA) 108 (90 BASE) MCG/ACT INHALER    TAKE 2 PUFFS BY MOUTH EVERY 6 HOURS AS NEEDED FOR WHEEZE OR SHORTNESS OF BREATH   FLUTICASONE-UMECLIDIN-VILANT (  TRELEGY ELLIPTA) 200-62.5-25 MCG/ACT AEPB    Inhale 1 puff into the lungs daily.   IBUPROFEN (ADVIL) 200 MG TABLET    Take 200 mg by mouth as needed.   LEVETIRACETAM (KEPPRA) 500 MG TABLET    Take 1 tablet (500 mg total) by mouth 2 (two) times daily.   METHOCARBAMOL (ROBAXIN) 500 MG TABLET    TAKE 0.5 TABLETS (250 MG TOTAL) BY MOUTH 4 (FOUR) TIMES DAILY.   MULTIPLE VITAMINS-MINERALS (MULTIVITAMIN WITH MINERALS) TABLET    Take 1 tablet by mouth daily.   PIRFENIDONE (ESBRIET) 801 MG TABS    Take 801 mg by mouth 3 (three) times daily with meals.  Modified Medications   No medications on file  Discontinued Medications   No medications on file    Physical Exam:  There were no vitals filed for this visit. There is no height or weight on file to calculate BMI. Wt Readings from Last 3 Encounters:  06/09/22 200 lb (90.7 kg)   04/21/22 202 lb 9.6 oz (91.9 kg)  04/16/22 202 lb (91.6 kg)    Physical Exam Vitals reviewed.  Constitutional:      General: He is not in acute distress. HENT:     Head: Normocephalic.  Eyes:     General:        Right eye: No discharge.        Left eye: No discharge.  Cardiovascular:     Rate and Rhythm: Normal rate and regular rhythm.     Pulses: Normal pulses.     Heart sounds: Normal heart sounds.  Pulmonary:     Effort: Pulmonary effort is normal. No respiratory distress.     Breath sounds: Normal breath sounds. No wheezing.  Abdominal:     General: Bowel sounds are normal. There is no distension.     Palpations: Abdomen is soft.     Tenderness: There is no abdominal tenderness.  Musculoskeletal:     Cervical back: Neck supple. Crepitus present. Pain with movement present. Decreased range of motion.     Right lower leg: No edema.     Left lower leg: No edema.  Skin:    General: Skin is warm and dry.     Capillary Refill: Capillary refill takes less than 2 seconds.  Neurological:     General: No focal deficit present.     Mental Status: He is oriented to person, place, and time.     Motor: No weakness.     Gait: Gait normal.  Psychiatric:        Mood and Affect: Mood normal.        Behavior: Behavior normal.     Labs reviewed: Basic Metabolic Panel: Recent Labs    10/03/21 1230 06/09/22 1353  NA 135 137  K 3.8 4.0  CL 106 107  CO2 21* 23  GLUCOSE 100* 95  BUN 10 9  CREATININE 0.60* 0.67  CALCIUM 9.8 10.1   Liver Function Tests: Recent Labs    10/03/21 1230 04/15/22 0905 06/09/22 1353  AST _0 ALT _1 ALKPHOS 93 85 98  98  BILITOT 0.9 1.2 1.4*  1.4*  PROT 8.0 7.9 7.5  7.5  ALBUMIN 4.1 4.4 4.0  4.0   No results for input(s): "LIPASE", "AMYLASE" in the last 8760 hours. No results for input(s): "AMMONIA" in the last 8760 hours. CBC: Recent Labs    10/03/21 1230  WBC 3.2*  NEUTROABS 2.1  HGB 13.9  HCT 40.1  MCV  89.5  PLT 67*   Lipid Panel: Recent Labs    03/17/22 1008  CHOL 159  HDL 69  LDLCALC 79  TRIG 40  CHOLHDL 2.3   TSH: No results for input(s): "TSH" in the last 8760 hours. A1C: No results found for: "HGBA1C"   Assessment/Plan 1. Neck pain on right side - ongoing since 03/2022 - 04/27 xray neck revealed narrowing C5-6, C6-7 - decreased ROM and crepitus on exam - discussed neurosurgery consult- will discuss with neuro first - predniSONE (DELTASONE) 10 MG tablet; Take 4 tablets (40 mg total) by mouth daily with breakfast for 2 days, THEN 3 tablets (30 mg total) daily with breakfast for 2 days, THEN 2 tablets (20 mg total) daily with breakfast for 2 days, THEN 1 tablet (10 mg total) daily with breakfast for 2 days, THEN 0.5 tablets (5 mg total) daily with breakfast for 2 days.  Dispense: 21 tablet; Refill: 0 - cont Advil prn- advised to take with food  2. Dizziness - denies signs of bleeding - ? Dizziness vs seizure - CBC with Differential/Platelet - CMP with eGFR([facility])  3. Nonintractable generalized idiopathic epilepsy without status epilepticus ([place]) - see above - followed by neurology - cont Keppra  4. Idiopathic pulmonary fibrosis ([place]) - followed by pulmonary - CT chest scheduled 09/13 - cont albuterol prn and Esbriet  5. Anxiety and depression - some depression with pulmonary fibrosis diagnosis - doing well with counseling through [facility] - not on medication at this time  Total time: 32 minutes. Greater than 50% of total time spent doing patient education regarding chronic neck pain, seizures, pulmonary fibrosis, depression and anxiety.     Next appt: Visit date not found  [place], [facility] [phone]

## 2022-08-21 ENCOUNTER — Encounter: Payer: Self-pay | Admitting: Neurology

## 2022-08-21 ENCOUNTER — Ambulatory Visit: Payer: Medicare Other | Admitting: Neurology

## 2022-08-21 VITALS — BP 105/59 | HR 64 | Ht 72.0 in | Wt 200.2 lb

## 2022-08-21 DIAGNOSIS — G40309 Generalized idiopathic epilepsy and epileptic syndromes, not intractable, without status epilepticus: Secondary | ICD-10-CM | POA: Diagnosis not present

## 2022-08-21 DIAGNOSIS — M545 Low back pain, unspecified: Secondary | ICD-10-CM | POA: Diagnosis not present

## 2022-08-21 DIAGNOSIS — R55 Syncope and collapse: Secondary | ICD-10-CM

## 2022-08-21 LAB — COMPLETE METABOLIC PANEL WITH GFR
AG Ratio: 1.5 (calc) (ref 1.0–2.5)
ALT: 25 U/L (ref 9–46)
AST: 24 U/L (ref 10–35)
Albumin: 4.5 g/dL (ref 3.6–5.1)
Alkaline phosphatase (APISO): 90 U/L (ref 35–144)
BUN: 11 mg/dL (ref 7–25)
CO2: 25 mmol/L (ref 20–32)
Calcium: 10.5 mg/dL — ABNORMAL HIGH (ref 8.6–10.3)
Chloride: 107 mmol/L (ref 98–110)
Creat: 0.71 mg/dL (ref 0.70–1.35)
Globulin: 3.1 g/dL (calc) (ref 1.9–3.7)
Glucose, Bld: 82 mg/dL (ref 65–139)
Potassium: 4 mmol/L (ref 3.5–5.3)
Sodium: 139 mmol/L (ref 135–146)
Total Bilirubin: 0.9 mg/dL (ref 0.2–1.2)
Total Protein: 7.6 g/dL (ref 6.1–8.1)
eGFR: 101 mL/min/{1.73_m2} (ref >=60)

## 2022-08-21 LAB — CBC WITH DIFFERENTIAL/PLATELET
Absolute Monocytes: 241 cells/uL (ref 200–950)
Basophils Absolute: 22 cells/uL (ref 0–200)
Basophils Relative: 0.6 %
Eosinophils Absolute: 122 cells/uL (ref 15–500)
Eosinophils Relative: 3.4 %
HCT: 44.4 % (ref 38.5–50.0)
Hemoglobin: 15 g/dL (ref 13.2–17.1)
Lymphs Abs: 774 cells/uL — ABNORMAL LOW (ref 850–3900)
MCH: 32.1 pg (ref 27.0–33.0)
MCHC: 33.8 g/dL (ref 32.0–36.0)
MCV: 94.9 fL (ref 80.0–100.0)
MPV: 9.8 fL (ref 7.5–12.5)
Monocytes Relative: 6.7 %
Neutro Abs: 2441 cells/uL (ref 1500–7800)
Neutrophils Relative %: 67.8 %
Platelets: 57 10*3/uL — ABNORMAL LOW (ref 140–400)
RBC: 4.68 10*6/uL (ref 4.20–5.80)
RDW: 13.3 % (ref 11.0–15.0)
Total Lymphocyte: 21.5 %
WBC: 3.6 10*3/uL — ABNORMAL LOW (ref 3.8–10.8)

## 2022-08-21 NOTE — Patient Instructions (Signed)
Continue Keppra '500mg'$  twice daily Will try to order ambulatory eeg to capture spell of dizziness If back pain not improved by end of prednisone taper, let me know and I can prescribe physical therapy Follow up 5 months.

## 2022-08-26 ENCOUNTER — Encounter: Payer: Self-pay | Admitting: Internal Medicine

## 2022-08-26 ENCOUNTER — Other Ambulatory Visit (INDEPENDENT_AMBULATORY_CARE_PROVIDER_SITE_OTHER): Payer: Medicare Other

## 2022-08-26 ENCOUNTER — Ambulatory Visit: Payer: Medicare Other | Admitting: Internal Medicine

## 2022-08-26 VITALS — BP 100/60 | HR 71 | Ht 72.0 in | Wt 203.0 lb

## 2022-08-26 DIAGNOSIS — K766 Portal hypertension: Secondary | ICD-10-CM | POA: Diagnosis not present

## 2022-08-26 DIAGNOSIS — K3189 Other diseases of stomach and duodenum: Secondary | ICD-10-CM | POA: Diagnosis not present

## 2022-08-26 DIAGNOSIS — K429 Umbilical hernia without obstruction or gangrene: Secondary | ICD-10-CM

## 2022-08-26 DIAGNOSIS — I8511 Secondary esophageal varices with bleeding: Secondary | ICD-10-CM | POA: Diagnosis not present

## 2022-08-26 DIAGNOSIS — B182 Chronic viral hepatitis C: Secondary | ICD-10-CM | POA: Diagnosis not present

## 2022-08-26 DIAGNOSIS — F1020 Alcohol dependence, uncomplicated: Secondary | ICD-10-CM

## 2022-08-26 DIAGNOSIS — K7460 Unspecified cirrhosis of liver: Secondary | ICD-10-CM

## 2022-08-26 LAB — BASIC METABOLIC PANEL
BUN: 13 mg/dL (ref 6–23)
CO2: 28 mEq/L (ref 19–32)
Calcium: 10.6 mg/dL — ABNORMAL HIGH (ref 8.4–10.5)
Chloride: 105 mEq/L (ref 96–112)
Creatinine, Ser: 0.72 mg/dL (ref 0.40–1.50)
GFR: 95.23 mL/min (ref >60.00)
Glucose, Bld: 76 mg/dL (ref 70–99)
Potassium: 4.2 mEq/L (ref 3.5–5.1)
Sodium: 138 mEq/L (ref 135–145)

## 2022-08-26 LAB — PROTIME-INR
INR: 1.1 ratio — ABNORMAL HIGH (ref 0.8–1.0)
Prothrombin Time: 12.3 s (ref 9.6–13.1)

## 2022-08-26 NOTE — Patient Instructions (Signed)
You have been scheduled for an endoscopy. Please follow written instructions given to you at your visit today. If you use inhalers (even only as needed), please bring them with you on the day of your procedure.  Your provider has requested that you go to the basement level for lab work before leaving today. Press "B" on the elevator. The lab is located at the first door on the left as you exit the elevator.  Due to recent changes in healthcare laws, you may see the results of your imaging and laboratory studies on MyChart before your provider has had a chance to review them.  We understand that in some cases there may be results that are confusing or concerning to you. Not all laboratory results come back in the same time frame and the provider may be waiting for multiple results in order to interpret others.  Please give [name] 48 hours in order for your provider to thoroughly review all the results before contacting the office for clarification of your results.   _______________________________________________________  If you are age 63 or older, your body mass index should be between 23-30. Your Body mass index is 27.53 kg/m. If this is out of the aforementioned range listed, please consider follow up with your Primary Care Provider.  If you are age 28 or younger, your body mass index should be between 19-25. Your Body mass index is 27.53 kg/m. If this is out of the aformentioned range listed, please consider follow up with your Primary Care Provider.   ________________________________________________________  The [facility] providers would like to encourage you to use [facility] to communicate with providers for non-urgent requests or questions.  Due to long hold times on the telephone, sending your provider a message by [facility] may be a faster and more efficient way to get a response.  Please allow 48 business hours for a response.  Please remember that this is for non-urgent requests.   _______________________________________________________   I appreciate the opportunity to care for you. Silvano Rusk, MD, [facility]

## 2022-08-26 NOTE — Progress Notes (Signed)
Francisco [name] 66 y.o. 12-11-1956 [id number]  Assessment & Plan:   Encounter Diagnoses  Name Primary?   Hepatic cirrhosis due to chronic hepatitis C infection ([place]) Yes   Secondary esophageal varices with bleeding (HCC)    Portal hypertensive gastropathy (HCC)    Alcoholism ([place])    Umbilical hernia without obstruction and without gangrene    Hypercalcemia      Francisco [name] is stable from a liver perspective.  He had a transient relapse with alcoholism but is getting help from the [facility]. I will reassess calcium level and also AFP and INR.  He has had hypercalcemia in the past as well.  Surveillance EGD for varices is scheduled for November at [facility].  I had discontinued nadolol in him previously because of concerns over exacerbation of headaches in the past.  His varices have remained very small.  We will consider the possibility of carvedilol therapy pending the next EGD.  Orders Placed This Encounter  Procedures   Procedural/ Surgical Case Request: ESOPHAGOGASTRODUODENOSCOPY (EGD) WITH PROPOFOL   Basic metabolic panel   AFP tumor marker   Protime-INR   Ambulatory referral to Gastroenterology      Subjective:   Chief Complaint: Follow-up of cirrhosis  GI problem summary  #1-Cirrhosis from HCV/alcohol (seen here since 2015).  With history of variceal bleeding, status post banding.  Last EGD 2022 very small varices and portal gastropathy no bleeding problems for years.  Nadolol stopped due to suspected side effects (headaches) HCV eradicated with Harvoni (Dr. Linus Salmons).  Transient hepatic encephalopathy possible 2018.  Hepatocellular carcinoma screening has been negative with ultrasound and AFP over the years. Liver biopsy 2011 1. Liver, needle/core biopsy, three 18-gauge :   CHRONIC ACTIVE HEPATITIS WITH ADVANCED FIBROSIS CHARACTERIZED BY,   MILD ACTIVITY, GRADE 2,   INCREASED FIBROSIS, STAGE 3/4   SLIGHTLY INCREASED IRON.    #2 personal history of  colon polyps-2 adenomas 2015, no polyps June 2022 10-year recall   HPI 66 year old white man with cirrhosis due to hepatitis C and some history of alcohol as well, who presents for routine follow-up last seen in February.  He had to move his father to a SNF, there was stress, he relapsed and drank some but then went to the [facility] and has been getting substance abuse counseling and therapy and says that is going very well and he is abstinent from alcohol and marijuana.  It was stressful moving his father, he was trying to care for him but it became too much so he moved his father.  He is very pleased with the care he is receiving at the [facility].     Last EGD 06/19/2021 with small varices grade 1 and portal gastropathy There is a prior history of variceal bleeding.  None in many years.  In mid August he had a seizure, he fell and hit his back on a curb and has been prescribed a prednisone taper for that.  He was working in the yard quite a bit and thought he might have been dehydrated as a trigger for the seizure.  He had follow-up with neurology on that as well.  Urgent care note of 830 and neuro note of 08/21/2022 reviewed.  Dr. Tomi Likens thought he might have had vasovagal issue and not a seizure causing his near syncope.  48-hour ambulatory EEG is planned saw primary care regarding his neck pain on August 31 and I reviewed that note as well.  He stopped Esbriet for  his IPF because of side effects-rash and dizziness. Abdominal ultrasound IMPRESSION: 1. The liver demonstrates cirrhotic morphology. No liver mass identified. 2. Cholelithiasis.  The gallbladder is otherwise normal. 3. Splenomegaly as above. By measurement, the splenomegaly is improved on today's study. 4. No other abnormalities.     Electronically Signed   By: Dorise Bullion III M.D.   On: 07/02/2022 10:29  Lab Results  Component Value Date   WBC 3.6 (L) 08/20/2022   HGB 15.0 08/20/2022   HCT 44.4 08/20/2022   MCV  94.9 08/20/2022   PLT 57 (L) 08/20/2022   Lab Results  Component Value Date   ALT 25 08/20/2022   AST 24 08/20/2022   ALKPHOS 98 06/09/2022   ALKPHOS 98 06/09/2022   BILITOT 0.9 08/20/2022   Glucose, Bld 65 - 139 mg/dL 82   95 R   100 High  R, CM  93 R, CM    Comment: .         Non-fasting reference interval  .   BUN 7 - 25 mg/dL 11   9 R   10 R  13    Creat 0.70 - 1.35 mg/dL 0.71   0.67 R   0.60 Low  R  0.59 Low  R, CM    eGFR > OR = 60 mL/min/1.66m 101         BUN/Creatinine Ratio 6 - 22 (calc) SEE NOTE:      22    Comment:    Not Reported: BUN and Creatinine are within     reference range.  .   Sodium 135 - 146 mmol/L 139   137 R   135 R  138    Potassium 3.5 - 5.3 mmol/L 4.0   4.0 R   3.8 R  3.8    Chloride 98 - 110 mmol/L 107   107 R   106 R  108    CO2 20 - 32 mmol/L 25   23 R   21 Low  R  22    Calcium 8.6 - 10.3 mg/dL 10.5 High    10.1 R   9.8 R  9.7    Total Protein 6.1 - 8.1 g/dL 7.6          Allergies  Allergen Reactions   Naproxen Other (See Comments)    Esophageal bleeding   Nsaids     Avoid due to liver condition    Other     GDenmarkPig   Current Meds  Medication Sig   albuterol (VENTOLIN HFA) 108 (90 Base) MCG/ACT inhaler TAKE 2 PUFFS BY MOUTH EVERY 6 HOURS AS NEEDED FOR WHEEZE OR SHORTNESS OF BREATH   Fluticasone-Umeclidin-Vilant (TRELEGY ELLIPTA) 200-62.5-25 MCG/ACT AEPB Inhale 1 puff into the lungs daily.   ibuprofen (ADVIL) 200 MG tablet Take 200 mg by mouth as needed.   levETIRAcetam (KEPPRA) 500 MG tablet Take 1 tablet (500 mg total) by mouth 2 (two) times daily.   methocarbamol (ROBAXIN) 500 MG tablet TAKE 0.5 TABLETS (250 MG TOTAL) BY MOUTH 4 (FOUR) TIMES DAILY.   Multiple Vitamins-Minerals (MULTIVITAMIN WITH MINERALS) tablet Take 1 tablet by mouth daily.   predniSONE (DELTASONE) 10 MG tablet Take 4 tablets (40 mg total) by mouth daily with breakfast for 2 days, THEN 3 tablets (30 mg total) daily with breakfast for 2 days, THEN 2 tablets (20 mg  total) daily with breakfast for 2 days, THEN 1 tablet (10 mg total) daily with breakfast for 2 days, THEN 0.5  tablets (5 mg total) daily with breakfast for 2 days.   Past Medical History:  Diagnosis Date   Allergy to [place] pig dander    Arthritis    "hands and knees the most; mild" (03/15/2017)   Benign neoplasm of colon 07/19/2014   07/19/14 - 2 adenomas - repeat colonoscopy 2020    Cirrhosis ([place])    Diverticulosis 2008   Epilepsy ([place])    Esophageal varices with bleeding ([place]) 07/19/2014   Falls related to headaches    Fatty liver 2011   Abd [name]   Headaches thought due to old head injury    Hepatic cirrhosis due to chronic hepatitis C infection ([place]) and some component of alcohol use 07/19/2014   Hepatitis C    History of blood transfusion 1987   "related to the zygomatic arch fracture"   Iron deficiency anemia secondary to blood loss (chronic) 07/19/2014   Portal hypertensive gastropathy ([place]) 07/19/2014   Scoliosis    Seizures ([place])    Torn rotator cuff    Past Surgical History:  Procedure Laterality Date   ABDOMINAL SURGERY     Knife puncture to Stomach   COLONOSCOPY  2008   COLONOSCOPY N/A 07/19/2014   Procedure: COLONOSCOPY;  Surgeon: Gatha Mayer, MD;  Location: [facility];  Service: Endoscopy;  Laterality: N/A;   COLONOSCOPY WITH PROPOFOL N/A 06/19/2021   Procedure: COLONOSCOPY WITH PROPOFOL;  Surgeon: Gatha Mayer, MD;  Location: [facility] ENDOSCOPY;  Service: Endoscopy;  Laterality: N/A;   ESOPHAGEAL BANDING  04/14/2017   Procedure: ESOPHAGEAL BANDING;  Surgeon: Gatha Mayer, MD;  Location: [facility] ENDOSCOPY;  Service: Endoscopy;;   ESOPHAGOGASTRODUODENOSCOPY N/A 07/19/2014   Procedure: ESOPHAGOGASTRODUODENOSCOPY (EGD);  Surgeon: Gatha Mayer, MD;  Location: [facility] ENDOSCOPY;  Service: Endoscopy;  Laterality: N/A;   ESOPHAGOGASTRODUODENOSCOPY N/A 09/05/2014   Procedure: ESOPHAGOGASTRODUODENOSCOPY (EGD);  Surgeon: Gatha Mayer, MD;  Location: [facility];  Service:  Endoscopy;  Laterality: N/A;   ESOPHAGOGASTRODUODENOSCOPY N/A 10/17/2014   Procedure: ESOPHAGOGASTRODUODENOSCOPY (EGD);  Surgeon: Gatha Mayer, MD;  Location: [facility] ENDOSCOPY;  Service: Endoscopy;  Laterality: N/A;   ESOPHAGOGASTRODUODENOSCOPY N/A 12/19/2014   Procedure: ESOPHAGOGASTRODUODENOSCOPY (EGD);  Surgeon: Gatha Mayer, MD;  Location: [facility] ENDOSCOPY;  Service: Endoscopy;  Laterality: N/A;   ESOPHAGOGASTRODUODENOSCOPY N/A 03/16/2017   Procedure: ESOPHAGOGASTRODUODENOSCOPY (EGD);  Surgeon: Ladene Artist, MD;  Location: [facility] ENDOSCOPY;  Service: Endoscopy;  Laterality: N/A;   ESOPHAGOGASTRODUODENOSCOPY (EGD) WITH PROPOFOL N/A 11/21/2015   Procedure: ESOPHAGOGASTRODUODENOSCOPY (EGD) WITH PROPOFOL;  Surgeon: Gatha Mayer, MD;  Location: [facility];  Service: Endoscopy;  Laterality: N/A;   ESOPHAGOGASTRODUODENOSCOPY (EGD) WITH PROPOFOL N/A 04/14/2017   Procedure: ESOPHAGOGASTRODUODENOSCOPY (EGD) WITH PROPOFOL;  Surgeon: Gatha Mayer, MD;  Location: [facility] ENDOS[facility];  Service: Endoscopy;  Laterality: N/A;   ESOPHAGOGASTRODUODENOSCOPY (EGD) WITH PROPOFOL N/A 07/23/2017   Procedure: ESOPHAGOGASTRODUODENOSCOPY (EGD) WITH PROPOFOL;  Surgeon: Gatha Mayer, MD;  Location: [facility] ENDOSCOPY;  Service: Endoscopy;  Laterality: N/A;   ESOPHAGOGASTRODUODENOSCOPY (EGD) WITH PROPOFOL N/A 06/19/2021   Procedure: ESOPHAGOGASTRODUODENOSCOPY (EGD) WITH PROPOFOL;  Surgeon: Gatha Mayer, MD;  Location: [facility] ENDOSCOPY;  Service: Endoscopy;  Laterality: N/A;   FRACTURE SURGERY Right    cheek   GASTRIC VARICES BANDING N/A 10/17/2014   Procedure: GASTRIC VARICES BANDING;  Surgeon: Gatha Mayer, MD;  Location: [facility] ENDOSCOPY;  Service: Endoscopy;  Laterality: N/A;   ORIF ZYGOMATIC FRACTURE Right 1987   RADIUS SYNOSTOSIS PROXIMAL RESECTION W/ ALLOGRAFT Right 1980s   at the elbow   THYROIDECTOMY, PARTIAL Left  2012   Social History   Social History Narrative   Divorced, one daughter   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 Lives with his dog in a 1 story home Father and his dog moved 2020.    Right handed   One story home         Marital status:   Divorced                            What year were you married ? 1990      Highest Level of education completed: 2/1/2 Years college      Current or past profession: Health and safety inspector       Do you exercise?   Yes                           Type & how often Walk every day  ( 1/2-2 miles) [employer] work      ADVANCED DIRECTIVES (Please bring copies)      Do you have a living will? Yes      Do you have a DNR form?   Yes                    If not, do you want to discuss one?       Do you have signed POA?HPOA forms? Yes                If so, please bring to your appointment      FUNCTIONAL STATUS- To be completed by Spouse / child / Staff       Do you have difficulty bathing or dressing yourself ?  No      Do you have difficulty preparing food or eating ?  No      Do you have difficulty managing your mediation ?  No      Do you have difficulty managing your finances ?  No      Do you have difficulty affording your medication ?  No      family history includes Alcohol abuse in his maternal grandfather and maternal uncle; Ankylosing spondylitis (age of onset: 25) in his brother; Arthritis/Rheumatoid in his father; Breast cancer (age of onset: 45) in his mother; Cancer in his paternal grandfather; Crohn's disease (age of onset: 44) in his father; Depression (age of onset: 29) in his daughter; Epilepsy (age of onset: 93) in his sister; Hearing loss in his father; Heart disease in his maternal grandmother.   Review of Systems As above  Objective:   Physical Exam '@BP'  100/60   Pulse 71   Ht 6' (1.829 m)   Wt 203 lb (92.1 kg)   BMI 27.53 kg/m @  General:  NAD Eyes:   anicteric Lungs:  clear Heart::  S1S2 no rubs, murmurs or  gallops Abdomen:  soft and nontender, BS+, small soft reducible umbilical hernia, no HSM/mass/splenomegaly palpable. No ascites suspected. Ext:   no edema, + clubbing Neuro:  A and o x 3 and no asterixis    Data Reviewed:  See HPI

## 2022-08-27 LAB — AFP TUMOR MARKER: AFP-Tumor Marker: 4 ng/mL (ref <6.1)

## 2022-08-28 ENCOUNTER — Encounter: Payer: Self-pay | Admitting: Internal Medicine

## 2022-08-28 ENCOUNTER — Other Ambulatory Visit: Payer: Self-pay | Admitting: Internal Medicine

## 2022-08-31 ENCOUNTER — Other Ambulatory Visit: Payer: Medicare Other

## 2022-08-31 NOTE — Progress Notes (Unsigned)
48 hour EEG schedule for 9/19/ 9-12 pm and Routine EEG

## 2022-09-01 LAB — PTH, INTACT AND CALCIUM
Calcium: 9.8 mg/dL (ref 8.6–10.3)
PTH: 17 pg/mL (ref 16–77)

## 2022-09-02 ENCOUNTER — Encounter (HOSPITAL_COMMUNITY): Payer: Self-pay

## 2022-09-02 ENCOUNTER — Ambulatory Visit (HOSPITAL_COMMUNITY)
Admission: RE | Admit: 2022-09-02 | Discharge: 2022-09-02 | Disposition: A | Discharge status: Home / Self Care | Payer: Medicare Other | Source: Ambulatory Visit | Attending: Pulmonary Disease | Admitting: Pulmonary Disease

## 2022-09-02 ENCOUNTER — Telehealth: Payer: Self-pay | Admitting: Neurology

## 2022-09-02 DIAGNOSIS — R55 Syncope and collapse: Secondary | ICD-10-CM

## 2022-09-02 DIAGNOSIS — G40309 Generalized idiopathic epilepsy and epileptic syndromes, not intractable, without status epilepticus: Secondary | ICD-10-CM

## 2022-09-02 DIAGNOSIS — J849 Interstitial pulmonary disease, unspecified: Secondary | ICD-10-CM | POA: Insufficient documentation

## 2022-09-02 DIAGNOSIS — I700 Atherosclerosis of aorta: Secondary | ICD-10-CM | POA: Diagnosis not present

## 2022-09-02 DIAGNOSIS — J479 Bronchiectasis, uncomplicated: Secondary | ICD-10-CM | POA: Diagnosis not present

## 2022-09-02 DIAGNOSIS — I2510 Atherosclerotic heart disease of native coronary artery without angina pectoris: Secondary | ICD-10-CM | POA: Diagnosis not present

## 2022-09-02 DIAGNOSIS — J84112 Idiopathic pulmonary fibrosis: Secondary | ICD-10-CM | POA: Diagnosis not present

## 2022-09-02 NOTE — Telephone Encounter (Signed)
Patient has some concerns about doing a at home EEG. He would like to speak to some one about getting this done at a place where they can watch him closely. Please call

## 2022-09-02 NOTE — Telephone Encounter (Signed)
Per patient he had an seizure during an EEG and he is comfortable what will happen if he is alone.  Please advised.

## 2022-09-03 NOTE — Telephone Encounter (Signed)
Patient returned call

## 2022-09-03 NOTE — Telephone Encounter (Signed)
Pt stated that he wants to do the EMU because he is worried about having a seizure while being hooked up to the monitors

## 2022-09-03 NOTE — Telephone Encounter (Signed)
LMOVM for patient to give the office a call back.  Per DR.Jaffe, We already know he has drop attack seizures, which have been controlled.  I am trying to evaluate the dizzy spells, which I do not thing are seizures but want to make sure.  These are events that he has been experiencing regardless.  We can refer him for hospital admission to [facility] at the Epilepsy Monitoring Unit, but that will be much more expensive as it is a hospital admission.
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
## 2022-09-15 ENCOUNTER — Encounter: Payer: Self-pay | Admitting: Neurology

## 2022-09-15 ENCOUNTER — Ambulatory Visit: Payer: Medicare Other | Admitting: Neurology

## 2022-09-15 VITALS — BP 115/67 | HR 70 | Ht 72.0 in | Wt 208.8 lb

## 2022-09-15 DIAGNOSIS — R55 Syncope and collapse: Secondary | ICD-10-CM | POA: Diagnosis not present

## 2022-09-15 DIAGNOSIS — G40309 Generalized idiopathic epilepsy and epileptic syndromes, not intractable, without status epilepticus: Secondary | ICD-10-CM | POA: Diagnosis not present

## 2022-10-01 ENCOUNTER — Other Ambulatory Visit: Payer: Self-pay | Admitting: *Deleted

## 2022-10-01 DIAGNOSIS — D696 Thrombocytopenia, unspecified: Secondary | ICD-10-CM

## 2022-10-02 ENCOUNTER — Inpatient Hospital Stay: Payer: Medicare Other

## 2022-10-02 ENCOUNTER — Inpatient Hospital Stay: Payer: Medicare Other | Attending: Hematology | Admitting: Hematology

## 2022-10-02 VITALS — BP 128/75 | HR 60 | Temp 98.1°F | Resp 17 | Ht 72.0 in | Wt 208.5 lb

## 2022-10-02 DIAGNOSIS — Z820 Family history of epilepsy and other diseases of the nervous system: Secondary | ICD-10-CM | POA: Diagnosis not present

## 2022-10-02 DIAGNOSIS — Z811 Family history of alcohol abuse and dependence: Secondary | ICD-10-CM | POA: Diagnosis not present

## 2022-10-02 DIAGNOSIS — Z886 Allergy status to analgesic agent status: Secondary | ICD-10-CM | POA: Diagnosis not present

## 2022-10-02 DIAGNOSIS — Z8261 Family history of arthritis: Secondary | ICD-10-CM | POA: Diagnosis not present

## 2022-10-02 DIAGNOSIS — M791 Myalgia, unspecified site: Secondary | ICD-10-CM | POA: Insufficient documentation

## 2022-10-02 DIAGNOSIS — F17210 Nicotine dependence, cigarettes, uncomplicated: Secondary | ICD-10-CM | POA: Insufficient documentation

## 2022-10-02 DIAGNOSIS — Z8249 Family history of ischemic heart disease and other diseases of the circulatory system: Secondary | ICD-10-CM | POA: Diagnosis not present

## 2022-10-02 DIAGNOSIS — D696 Thrombocytopenia, unspecified: Secondary | ICD-10-CM | POA: Insufficient documentation

## 2022-10-02 DIAGNOSIS — Z818 Family history of other mental and behavioral disorders: Secondary | ICD-10-CM | POA: Diagnosis not present

## 2022-10-02 DIAGNOSIS — K7460 Unspecified cirrhosis of liver: Secondary | ICD-10-CM | POA: Insufficient documentation

## 2022-10-02 DIAGNOSIS — Z79899 Other long term (current) drug therapy: Secondary | ICD-10-CM | POA: Diagnosis not present

## 2022-10-02 DIAGNOSIS — Z86010 Personal history of colonic polyps: Secondary | ICD-10-CM | POA: Insufficient documentation

## 2022-10-02 DIAGNOSIS — K766 Portal hypertension: Secondary | ICD-10-CM | POA: Insufficient documentation

## 2022-10-02 DIAGNOSIS — J84112 Idiopathic pulmonary fibrosis: Secondary | ICD-10-CM | POA: Diagnosis not present

## 2022-10-02 DIAGNOSIS — Z809 Family history of malignant neoplasm, unspecified: Secondary | ICD-10-CM | POA: Diagnosis not present

## 2022-10-02 DIAGNOSIS — Z822 Family history of deafness and hearing loss: Secondary | ICD-10-CM | POA: Diagnosis not present

## 2022-10-02 DIAGNOSIS — R161 Splenomegaly, not elsewhere classified: Secondary | ICD-10-CM | POA: Insufficient documentation

## 2022-10-02 DIAGNOSIS — M419 Scoliosis, unspecified: Secondary | ICD-10-CM | POA: Diagnosis not present

## 2022-10-02 DIAGNOSIS — Z8379 Family history of other diseases of the digestive system: Secondary | ICD-10-CM | POA: Insufficient documentation

## 2022-10-02 DIAGNOSIS — Z803 Family history of malignant neoplasm of breast: Secondary | ICD-10-CM | POA: Diagnosis not present

## 2022-10-02 LAB — CBC WITH DIFFERENTIAL (CANCER CENTER ONLY)
Abs Immature Granulocytes: 0.01 10*3/uL (ref 0.00–0.07)
Basophils Absolute: 0 10*3/uL (ref 0.0–0.1)
Basophils Relative: 1 %
Eosinophils Absolute: 0.1 10*3/uL (ref 0.0–0.5)
Eosinophils Relative: 2 %
HCT: 39.7 % (ref 39.0–52.0)
Hemoglobin: 14 g/dL (ref 13.0–17.0)
Immature Granulocytes: 0 %
Lymphocytes Relative: 19 %
Lymphs Abs: 0.8 10*3/uL (ref 0.7–4.0)
MCH: 32.3 pg (ref 26.0–34.0)
MCHC: 35.3 g/dL (ref 30.0–36.0)
MCV: 91.5 fL (ref 80.0–100.0)
Monocytes Absolute: 0.2 10*3/uL (ref 0.1–1.0)
Monocytes Relative: 6 %
Neutro Abs: 2.9 10*3/uL (ref 1.7–7.7)
Neutrophils Relative %: 72 %
Platelet Count: 51 10*3/uL — ABNORMAL LOW (ref 150–400)
RBC: 4.34 MIL/uL (ref 4.22–5.81)
RDW: 13.7 % (ref 11.5–15.5)
WBC Count: 4 10*3/uL (ref 4.0–10.5)
nRBC: 0 % (ref 0.0–0.2)

## 2022-10-02 LAB — CMP (CANCER CENTER ONLY)
ALT: 18 U/L (ref 0–44)
AST: 23 U/L (ref 15–41)
Albumin: 4.2 g/dL (ref 3.5–5.0)
Alkaline Phosphatase: 94 U/L (ref 38–126)
Anion gap: 6 (ref 5–15)
BUN: 10 mg/dL (ref 8–23)
CO2: 26 mmol/L (ref 22–32)
Calcium: 10 mg/dL (ref 8.9–10.3)
Chloride: 107 mmol/L (ref 98–111)
Creatinine: 0.73 mg/dL (ref 0.61–1.24)
GFR, Estimated: >60 mL/min (ref >60)
Glucose, Bld: 89 mg/dL (ref 70–99)
Potassium: 4.1 mmol/L (ref 3.5–5.1)
Sodium: 139 mmol/L (ref 135–145)
Total Bilirubin: 1.2 mg/dL (ref 0.3–1.2)
Total Protein: 7.6 g/dL (ref 6.5–8.1)

## 2022-10-02 LAB — PROTIME-INR
INR: 1.2 (ref 0.8–1.2)
Prothrombin Time: 14.7 seconds (ref 11.4–15.2)

## 2022-10-08 ENCOUNTER — Encounter: Payer: Self-pay | Admitting: Pulmonary Disease

## 2022-10-08 ENCOUNTER — Ambulatory Visit: Payer: Medicare Other | Admitting: Pulmonary Disease

## 2022-10-08 VITALS — BP 124/58 | HR 60 | Temp 98.0°F | Ht 72.0 in | Wt 208.8 lb

## 2022-10-08 DIAGNOSIS — J849 Interstitial pulmonary disease, unspecified: Secondary | ICD-10-CM | POA: Diagnosis not present

## 2022-10-08 DIAGNOSIS — Z23 Encounter for immunization: Secondary | ICD-10-CM

## 2022-10-08 NOTE — Progress Notes (Signed)
Francisco [name]    [id number]    05/05/1956  Primary Care Physician:[place], Amy E, NP  Referring Physician: Yvonna Alanis, NP [address],  [place] [address]  Chief complaint:  Follow-up for IPF Started Esbriet March 3648  HPI: 66 year old with history of allergies, hepatitis C, cirrhosis referred for evaluation of abnormal finding seen on CT scan. Screening CT in early 2022 showed mild subpleural reticulation.  Follow-up high-resolution CT in July 2022 showed pulmonary fibrosis with honeycombing and has been referred here for further evaluation next Has complaints of chest congestion, no dyspnea, has small joint pain, stiffness Has strong family history of CTD with rheumatoid arthritis in father and ankylosing spondylitis and brother He has consulted with Dr. Benjamine Mola, rheumatology in February 2023 who does not feel there is any evidence of connective tissue disease.  He has history of cirrhosis secondary to hepatitis C which was treated with Harvoni.  Case was discussed at multidisciplinary conference on 09/02/2021 and CT findings was consistent with UIP fibrosis.  He has started Esbriet in March 2023.   Pets: No pets Occupation: Worked as a Freight forwarder for a [employer], community.  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  No mold, hot tub, Jacuzzi.  No feather pillows or comforters.  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  Outpatient Encounter Medications as of 10/08/2022  Medication Sig   albuterol (VENTOLIN HFA) 108 (90 Base) MCG/ACT inhaler TAKE 2 PUFFS BY MOUTH EVERY 6 HOURS AS NEEDED FOR WHEEZE OR  SHORTNESS OF BREATH   amoxicillin (AMOXIL) 500 MG capsule Take 500 mg by mouth 3 (three) times daily.   Fluticasone-Umeclidin-Vilant (TRELEGY ELLIPTA) 200-62.5-25 MCG/ACT AEPB Inhale 1 puff into the lungs daily.   ibuprofen (ADVIL) 200 MG tablet Take 200 mg by mouth as needed.   levETIRAcetam (KEPPRA) 500 MG tablet Take 1 tablet (500 mg total) by mouth 2 (two) times daily.   methocarbamol (ROBAXIN) 500 MG tablet TAKE 0.5 TABLETS (250 MG TOTAL) BY MOUTH 4 (FOUR) TIMES DAILY.   Multiple Vitamins-Minerals (MULTIVITAMIN WITH MINERALS) tablet Take 1 tablet by mouth daily.   No facility-administered encounter medications on file as of 10/08/2022.    Physical Exam: Gen:      No acute distress HEENT:  EOMI, sclera anicteric Neck:     No masses; no thyromegaly Lungs:    Bibasal crackles CV:         Regular rate and rhythm; no murmurs Abd:      + bowel sounds; soft, non-tender; no palpable masses, no distension Ext:    No edema; adequate peripheral perfusion Skin:      Warm and dry; no rash Neuro: alert and oriented x 3 Psych: normal mood and affect   Data Reviewed: Imaging: CT lung cancer screening 04/05/2021-subpleural reticulation, calcified granuloma  High-resolution CT 06/27/2021-mild groundglass attenuation, subpleural reticulation with honeycombing most evident at the anterior aspect of the upper lobe, advanced cirrhosis  High-resolution CT chest 09/02/2021-slight progression pulmonary fibrosis in UIP pattern, cirrhosis I have reviewed the images personally  PFTs: 01/08/2022 FVC 4.49 [90%], FEV1 3.66 [98%], F/F 81, TLC 5.92 [79%], DLCO 15.19 [53%] Minimal restriction, moderate-severe diffusion defect  Labs:  CTD serologies 08/18/2021-ANA 1:80, cytoplasmic, rheumatoid factor 16, and anti Jo 1-20  Assessment:  Follow-up for interstitial lung disease Case reviewed at multidisciplinary conference in September 2022 CT shows moderate patchy reticulation with traction bronchiectasis,  honeycombing with basilar predominance Although there is increased disease in the anterior aspect of the upper lobe this process is felt to be more bullous emphysema and not honeycombing.  Pattern is still consistent with UIP by ATS criteria and this could be IPF in the right clinical setting especially given significant clubbing  He does have strong family history of connective tissue disease with positive ANA, rheumatoid factor and Jo 1.  Rheumatology evaluation noted with no evidence of autoimmune process No significant exposures noted  Follow CT reviewed with slight progression which is expected for the disease.  Continue Esbriet.  Monitor labs He has history of hepatitis C, cirrhosis and will need close monitoring.  His LFTs from earlier this month are within normal limits.  Emphysema He has significant emphysema on CT scan though no obstruction on spirometry.  Suspect that he does not have obstruction due to combined presence of emphysema and pulmonary fibrosis Trelegy has helped and we will send in a prescription to continue therapy  Plan/Recommendations: Continue Esbriet, check labs for monitoring Continue Trelegy Follow-up in 6 months with PFTs  Marshell Garfinkel MD [facility] 10/08/2022, 8:48 AM  CC: Yvonna Alanis, NP

## 2022-10-08 NOTE — Patient Instructions (Signed)
I am glad you are doing well with your breathing His CT shows slight progression which is expected for the disease Continue Esbriet Follow-up in 6 months with PFTs.

## 2022-10-09 NOTE — Progress Notes (Signed)
HEMATOLOGY/ONCOLOGY PHONE VISIT NOTE  Date of Service: .10/02/2022   Patient Care Team: Yvonna Alanis, NP as PCP - General (Adult Health Nurse Practitioner) Pieter Partridge, DO as Consulting Physician (Neurology)  CHIEF COMPLAINTS/PURPOSE OF CONSULTATION:  Follow-up for thrombocytopenia  HISTORY OF PRESENTING ILLNESS:   Francisco [name] is a wonderful 66 y.o. male who has been referred to [name] by Windell Moulding, NP for evaluation and management of thrombocytopenia. The pt reports that he is doing well overall.  The pt reports that he has had low Plt counts since 2018. The pt notes that he sees Dr. Carlean Purl and has done four bandings and nine endoscopies total. The pt has a history of liver cirrhosis and denies any known history of clotting disorder. The pt notes that the Hepatitis C he had was due to a blood transfusion in the 1980s and was undetectable within 5 weeks of the 12 week treatment. The pt notes he quit alcohol in 2015 and they discovered the Hepatitis and cirrhosis in 2016. The pt notes that they believe the alcohol exacerbated the cirrhosis and played a role in this. The pt notes that he was getting ultrasounds of the liver, but has not gotten one recently due to the pandemic. The pt notes that he wishes to get another one sometime soon to observe the changes. The pt notes that he has been on Keppra since Fall 2018. The pt notes that his mother and sister also have epilepsy. The pt notes he takes Advil once a month and Tylenol 3-4x a month, but denies needing them on a regular basis. The pt notes his diet and food intake has been very stable.  The pt notes no recent issues and was referred here due to a routine checkup. The pt note he did not have a PCP prior and this was a part of that visit. The pt notes that he lost 25 pounds within a span of 6 months after he retired. The pt notes that he believes he has been eating better, less stress, and more active.  Lab results 03/27/2021 of  CBC w/diff and CMP is as follows: all values are WNL except for WBC of 3.2K, Plt of 55K.  On review of systems, pt reports intermittent neck muscle pain and denies bleeding issues, decreased appetite, imbalanced diet, bloody stools, sudden weight changes, infection issues, cough, antibiotic usage, changes in bowel habits, and any other symptoms.  INTERVAL HISTORY  .Francisco [name] is here for 30-monthfollow-up of his thrombocytopenia after his last clinic visit about a year ago. He notes no acute new bleeding issues since his last visit.  He continues to follow with Dr. MVaughan Brownerand pulmonary for evaluation and management of idiopathic pulmonary fibrosis  He continues to follow-up with his neurologist Dr. AMetta Clinesfor his primary generalized epilepsy/drop attacks and continues to be on Keppra for this.  No significant use of NSAIDs reported Continue to follow with gastroenterology for evaluation and management of his liver cirrhosis.  Labs done today on 10/02/2022 were discussed with him in detail.  CBC shows normal WBC count of 4k, normal hemoglobin of 14 and a low platelet count of 51k which is slightly low from his baseline of about 60k in the context of new medication for his IPF and use of Keppra. He denies using any significant NSAIDs or other medications over-the-counter. Denies significant alcohol use. Denies significant bleeding issues.  MEDICAL HISTORY:  Past Medical History:  Diagnosis Date  Allergy to [place] pig dander    Arthritis    "hands and knees the most; mild" (03/15/2017)   Benign neoplasm of colon 07/19/2014   07/19/14 - 2 adenomas - repeat colonoscopy 2020    Cirrhosis ([place])    Diverticulosis 2008   Epilepsy ([place])    Esophageal varices with bleeding ([place]) 07/19/2014   Falls related to headaches    Fatty liver 2011   Abd [name]   Headaches thought due to old head injury    Hepatic cirrhosis due to chronic hepatitis C infection ([place]) and some component of  alcohol use 07/19/2014   Hepatitis C    History of blood transfusion 1987   "related to the zygomatic arch fracture"   Iron deficiency anemia secondary to blood loss (chronic) 07/19/2014   Portal hypertensive gastropathy ([place]) 07/19/2014   Scoliosis    Seizures ([place])    Torn rotator cuff     SURGICAL HISTORY: Past Surgical History:  Procedure Laterality Date   ABDOMINAL SURGERY     Knife puncture to Stomach   COLONOSCOPY  2008   COLONOSCOPY N/A 07/19/2014   Procedure: COLONOSCOPY;  Surgeon: Gatha Mayer, MD;  Location: [facility] ENDOSCOPY;  Service: Endoscopy;  Laterality: N/A;   COLONOSCOPY WITH PROPOFOL N/A 06/19/2021   Procedure: COLONOSCOPY WITH PROPOFOL;  Surgeon: Gatha Mayer, MD;  Location: [facility] ENDOSCOPY;  Service: Endoscopy;  Laterality: N/A;   ESOPHAGEAL BANDING  04/14/2017   Procedure: ESOPHAGEAL BANDING;  Surgeon: Gatha Mayer, MD;  Location: [facility] ENDOSCOPY;  Service: Endoscopy;;   ESOPHAGOGASTRODUODENOSCOPY N/A 07/19/2014   Procedure: ESOPHAGOGASTRODUODENOSCOPY (EGD);  Surgeon: Gatha Mayer, MD;  Location: [facility] ENDOSCOPY;  Service: Endoscopy;  Laterality: N/A;   ESOPHAGOGASTRODUODENOSCOPY N/A 09/05/2014   Procedure: ESOPHAGOGASTRODUODENOSCOPY (EGD);  Surgeon: Gatha Mayer, MD;  Location: [facility] ENDOSCOPY;  Service: Endoscopy;  Laterality: N/A;   ESOPHAGOGASTRODUODENOSCOPY N/A 10/17/2014   Procedure: ESOPHAGOGASTRODUODENOSCOPY (EGD);  Surgeon: Gatha Mayer, MD;  Location: [facility] ENDOSCOPY;  Service: Endoscopy;  Laterality: N/A;   ESOPHAGOGASTRODUODENOSCOPY N/A 12/19/2014   Procedure: ESOPHAGOGASTRODUODENOSCOPY (EGD);  Surgeon: Gatha Mayer, MD;  Location: [facility] ENDOSCOPY;  Service: Endoscopy;  Laterality: N/A;   ESOPHAGOGASTRODUODENOSCOPY N/A 03/16/2017   Procedure: ESOPHAGOGASTRODUODENOSCOPY (EGD);  Surgeon: Ladene Artist, MD;  Location[facility] ENDOSCOPY;  Service: Endoscopy;  Laterality: N/A;   ESOPHAGOGASTRODUODENOSCOPY (EGD) WITH PROPOFOL N/A 11/21/2015   Procedure:  ESOPHAGOGASTRODUODENOSCOPY (EGD) WITH PROPOFOL;  Surgeon: Gatha Mayer, MD;  Location: [facility];  Service: Endoscopy;  Laterality: N/A;   ESOPHAGOGASTRODUODENOSCOPY (EGD) WITH PROPOFOL N/A 04/14/2017   Procedure: ESOPHAGOGASTRODUODENOSCOPY (EGD) WITH PROPOFOL;  Surgeon: Gatha Mayer, MD;  Location: [facility] ENDOSCOPY;  Service: Endoscopy;  Laterality: N/A;   ESOPHAGOGASTRODUODENOSCOPY (EGD) WITH PROPOFOL N/A 07/23/2017   Procedure: ESOPHAGOGASTRODUODENOSCOPY (EGD) WITH PROPOFOL;  Surgeon: Gatha Mayer, MD;  Location: [facility] ENDOSCOPY;  Service: Endoscopy;  Laterality: N/A;   ESOPHAGOGASTRODUODENOSCOPY (EGD) WITH PROPOFOL N/A 06/19/2021   Procedure: ESOPHAGOGASTRODUODENOSCOPY (EGD) WITH PROPOFOL;  Surgeon: Gatha Mayer, MD;  Location: [facility] ENDOSCOPY;  Service: Endoscopy;  Laterality: N/A;   FRACTURE SURGERY Right    cheek   GASTRIC VARICES BANDING N/A 10/17/2014   Procedure: GASTRIC VARICES BANDING;  Surgeon: Gatha Mayer, MD;  Location: [facility] ENDOSCOPY;  Service: Endoscopy;  Laterality: N/A;   ORIF ZYGOMATIC FRACTURE Right 1987   RADIUS SYNOSTOSIS PROXIMAL RESECTION W/ ALLOGRAFT Right 1980s   at the elbow   THYROIDECTOMY, PARTIAL Left 2012    SOCIAL HISTORY: Social History   Socioeconomic History   Marital status:  Divorced    Spouse name: Not on file   Number of children: 1   Years of education: some college   Highest education level: Not on file  Occupational History   Occupation: Secretary/administrator- retired    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  [employer]  Tobacco Use   Smoking status: Every Day    Packs/day: 0.50    Years: 47.00    Total pack years: 23.50    Types: Cigarettes    Start date: 05/08/1974    Last attempt to quit: 03/04/2022    Years since quitting: 0.6   Smokeless tobacco: Never   Tobacco comments:    Smokes 3-4 cigs a day 10/08/22  Vaping Use   Vaping Use: Never used  Substance and Sexual Activity   Alcohol use: No    Comment: 03/15/2017 "used to drink  considerably; quit 08/27/14"   Drug use: No   Sexual activity: Not Currently  Other Topics Concern   Not on file  Social History Narrative   Divorced, one daughter   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 Lives with his dog in a 1 story home Father and his dog moved 2020.    Right handed   One story home         Marital status:   Divorced                            What year were you married ? 1990      Highest Level of education completed: 2/1/2 Years college      Current or past profession: Health and safety inspector       Do you exercise?   Yes                           Type & how often Walk every day  ( 1/2-2 miles) [employer] work      ADVANCED DIRECTIVES (Please bring copies)      Do you have a living will? Yes      Do you have a DNR form?   Yes                    If not, do you want to discuss one?       Do you have signed POA?HPOA forms? Yes                If so, please bring to your appointment      FUNCTIONAL STATUS- To be completed by Spouse / child / Staff       Do you have difficulty bathing or dressing yourself ?  No      Do you have difficulty preparing food or eating ?  No      Do you have difficulty managing your mediation ?  No      Do you have difficulty managing your finances ?  No      Do you have difficulty affording your medication ?  No      Social Determinants of Radio broadcast assistant Strain: Not on file  Food Insecurity: Not on file  Transportation Needs: Not on file  Physical Activity: Not on file  Stress: Not on file  Social Connections: Not on file  Intimate Partner Violence: Not on file    FAMILY HISTORY: Family History  Problem Relation Age of Onset   Breast cancer Mother 46   Crohn's disease Father 74   Arthritis/Rheumatoid Father    Hearing loss Father    Epilepsy Sister 6   Ankylosing spondylitis Brother 64    Heart disease Maternal Grandmother    Alcohol abuse Maternal Grandfather    Cancer Paternal Grandfather        type unknown, mets   Alcohol abuse Maternal Uncle        several   Depression Daughter 20    ALLERGIES:  is allergic to naproxen, nsaids, and other.  MEDICATIONS:  Current Outpatient Medications  Medication Sig Dispense Refill   albuterol (VENTOLIN HFA) 108 (90 Base) MCG/ACT inhaler TAKE 2 PUFFS BY MOUTH EVERY 6 HOURS AS NEEDED FOR WHEEZE OR SHORTNESS OF BREATH 18 each 5   amoxicillin (AMOXIL) 500 MG capsule Take 500 mg by mouth 3 (three) times daily.     Fluticasone-Umeclidin-Vilant (TRELEGY ELLIPTA) 200-62.5-25 MCG/ACT AEPB Inhale 1 puff into the lungs daily. 60 each 5   ibuprofen (ADVIL) 200 MG tablet Take 200 mg by mouth as needed.     levETIRAcetam (KEPPRA) 500 MG tablet Take 1 tablet (500 mg total) by mouth 2 (two) times daily. 60 tablet 5   methocarbamol (ROBAXIN) 500 MG tablet TAKE 0.5 TABLETS (250 MG TOTAL) BY MOUTH 4 (FOUR) TIMES DAILY. 60 tablet 5   Multiple Vitamins-Minerals (MULTIVITAMIN WITH MINERALS) tablet Take 1 tablet by mouth daily.     No current facility-administered medications for this visit.    REVIEW OF SYSTEMS:   10 Point review of Systems was done is negative except as noted above.  PHYSICAL EXAMINATION: ECOG PERFORMANCE STATUS: 1 - Symptomatic but completely ambulatory  . Vitals:   10/02/22 1302  BP: 128/75  Pulse: 60  Resp: 17  Temp: 98.1 F (36.7 C)  SpO2: 98%   Filed Weights   10/02/22 1302  Weight: 208 lb 8 oz (94.6 kg)   .Body mass index is 28.28 kg/m.  .. GENERAL:alert, in no acute distress and comfortable SKIN: no acute rashes, no significant lesions EYES: conjunctiva are pink and non-injected, sclera anicteric OROPHARYNX: MMM, no exudates, no oropharyngeal erythema or ulceration NECK: supple, no JVD LYMPH:  no palpable lymphadenopathy in the cervical, axillary or inguinal regions LUNGS: clear to auscultation b/l  with normal respiratory effort HEART: regular rate & rhythm ABDOMEN:  normoactive bowel sounds , non tender, not distended. Extremity: no pedal edema PSYCH: alert & oriented x 3 with fluent speech NEURO: no focal motor/sensory deficits    LABORATORY DATA:  I have reviewed the data as listed  .    Latest Ref Rng & Units 10/02/2022   12:42 PM 08/20/2022    9:59 AM 10/03/2021   12:30 PM  CBC  WBC 4.0 - 10.5 K/uL 4.0  3.6  3.2   Hemoglobin 13.0 - 17.0 g/dL 14.0  15.0  13.9   Hematocrit 39.0 - 52.0 % 39.7  44.4  40.1   Platelets 150 - 400 K/uL 51  57  67     .    Latest Ref Rng & Units 10/02/2022   12:42 PM 08/31/2022   12:20 PM 08/26/2022   11:21 AM  CMP  Glucose 70 - 99 mg/dL 89   76   BUN 8 - 23 mg/dL 10   13   Creatinine 0.61 - 1.24 mg/dL 0.73   0.72  Sodium 135 - 145 mmol/L 139   138   Potassium 3.5 - 5.1 mmol/L 4.1   4.2   Chloride 98 - 111 mmol/L 107   105   CO2 22 - 32 mmol/L 26   28   Calcium 8.9 - 10.3 mg/dL 10.0  9.8  10.6   Total Protein 6.5 - 8.1 g/dL 7.6     Total Bilirubin 0.3 - 1.2 mg/dL 1.2     Alkaline Phos 38 - 126 U/L 94     AST 15 - 41 U/L 23     ALT 0 - 44 U/L 18      Component     Latest Ref Rng & Units 04/01/2021  Folate, Hemolysate     Not Estab. ng/mL 362.0  HCT     37.5 - 51.0 % 42.6  Folate, RBC     >498 ng/mL 850  Prothrombin Time     11.4 - 15.2 seconds 14.8  INR     0.8 - 1.2 1.2  Vitamin B12     180 - 914 pg/mL 221  Ferritin     24 - 336 ng/mL 129  Fibrinogen     210 - 475 mg/dL 357    RADIOGRAPHIC STUDIES: I have personally reviewed the radiological images as listed and agreed with the findings in the report.  [name] abd 05/26/2021: IMPRESSION: Changes of cirrhosis of the liver without focal mass.   Cholelithiasis without complicating factors.   Changes consistent with splenomegaly.     Electronically Signed   By: Inez Catalina M.D.   On: 05/27/2021 10:15   ASSESSMENT & PLAN:   66 yo with   1) Thrombocytopenia  related to liver cirrhosis with hypersplenism #2 history of previous hep C and alcohol abuse #3 history of esophageal varices and portal hypertensive gastropathy #4 IPF following with Dr. Vaughan Browner #5 history of generalized epilepsy/drop attacks following with Dr. Tomi Likens and is currently on [place]: - Labs done today on 10/02/2022 were discussed with him in detail.  CBC shows normal WBC count of 4k, normal hemoglobin of 14 and a low platelet count of 51k which is slightly low from his baseline of about 60k in the context of new medication for his IPF and use of Keppra. He denies using any significant NSAIDs or other medications over-the-counter. Denies significant alcohol use. Denies significant bleeding issues. -Continue follow-up with gastroenterology for management of his liver cirrhosis and related complications.  He will continue to need [place] screening every 6 months with his gastroenterologist. -Continue vitamin B complex Minimize medications causing thrombocytopenia No indication to initiate treatment for his thrombocytopenia at this time.  FOLLOW UP: RTC with Dr Irene Limbo with labs in 12 months   The total time spent in the appointment was 20 minutes*.  All of the patient's questions were answered with apparent satisfaction. The patient knows to call the clinic with any problems, questions or concerns.   Sullivan Lone MD MS AAHIVMS [facility] Hematology/Oncology Physician [facility]  .*Total Encounter Time as defined by the Centers for Medicare and Medicaid Services includes, in addition to the face-to-face time of a patient visit (documented in the note above) non-face-to-face time: obtaining and reviewing outside history, ordering and reviewing medications, tests or procedures, care coordination (communications with other health care professionals or caregivers) and documentation in the medical record.

## 2022-10-15 ENCOUNTER — Encounter (HOSPITAL_COMMUNITY): Payer: Self-pay | Admitting: Internal Medicine

## 2022-10-22 ENCOUNTER — Ambulatory Visit (HOSPITAL_COMMUNITY): Payer: Medicare Other | Admitting: Anesthesiology

## 2022-10-22 ENCOUNTER — Encounter (HOSPITAL_COMMUNITY)
Admission: RE | Disposition: A | Discharge status: Home / Self Care | Payer: Self-pay | Source: Home / Self Care | Attending: Internal Medicine

## 2022-10-22 ENCOUNTER — Ambulatory Visit (HOSPITAL_COMMUNITY)
Admission: RE | Admit: 2022-10-22 | Discharge: 2022-10-22 | Disposition: A | Discharge status: Home / Self Care | Payer: Medicare Other | Attending: Internal Medicine | Admitting: Internal Medicine

## 2022-10-22 ENCOUNTER — Other Ambulatory Visit: Payer: Self-pay

## 2022-10-22 ENCOUNTER — Ambulatory Visit (HOSPITAL_BASED_OUTPATIENT_CLINIC_OR_DEPARTMENT_OTHER): Payer: Medicare Other | Admitting: Anesthesiology

## 2022-10-22 ENCOUNTER — Encounter (HOSPITAL_COMMUNITY): Payer: Self-pay | Admitting: Internal Medicine

## 2022-10-22 DIAGNOSIS — K3189 Other diseases of stomach and duodenum: Secondary | ICD-10-CM

## 2022-10-22 DIAGNOSIS — B182 Chronic viral hepatitis C: Secondary | ICD-10-CM | POA: Diagnosis not present

## 2022-10-22 DIAGNOSIS — K7460 Unspecified cirrhosis of liver: Secondary | ICD-10-CM

## 2022-10-22 DIAGNOSIS — R569 Unspecified convulsions: Secondary | ICD-10-CM | POA: Diagnosis not present

## 2022-10-22 DIAGNOSIS — I8511 Secondary esophageal varices with bleeding: Secondary | ICD-10-CM

## 2022-10-22 DIAGNOSIS — K766 Portal hypertension: Secondary | ICD-10-CM

## 2022-10-22 DIAGNOSIS — I8510 Secondary esophageal varices without bleeding: Secondary | ICD-10-CM

## 2022-10-22 DIAGNOSIS — F17210 Nicotine dependence, cigarettes, uncomplicated: Secondary | ICD-10-CM | POA: Diagnosis not present

## 2022-10-22 HISTORY — PX: ESOPHAGOGASTRODUODENOSCOPY (EGD) WITH PROPOFOL: SHX5813

## 2022-10-22 SURGERY — ESOPHAGOGASTRODUODENOSCOPY (EGD) WITH PROPOFOL
Anesthesia: Monitor Anesthesia Care

## 2022-10-22 MED ORDER — LIDOCAINE 2% (20 MG/ML) 5 ML SYRINGE
INTRAMUSCULAR | Status: DC | PRN
  Administered 2022-10-22: 80 mg via INTRAVENOUS

## 2022-10-22 MED ORDER — LACTATED RINGERS IV SOLN: INTRAVENOUS | Status: DC | PRN

## 2022-10-22 MED ORDER — SODIUM CHLORIDE 0.9 % IV SOLN: INTRAVENOUS | Status: DC

## 2022-10-22 MED ORDER — PROPOFOL 500 MG/50ML IV EMUL
INTRAVENOUS | Status: AC
  Filled 2022-10-22: qty 50

## 2022-10-22 MED ORDER — PROPOFOL 500 MG/50ML IV EMUL
INTRAVENOUS | Status: DC | PRN
  Administered 2022-10-22: 125 ug/kg/min via INTRAVENOUS

## 2022-10-22 MED ORDER — PROPOFOL 10 MG/ML IV BOLUS
INTRAVENOUS | Status: DC | PRN
  Administered 2022-10-22: 20 mg via INTRAVENOUS
  Administered 2022-10-22 (×2): 40 mg via INTRAVENOUS

## 2022-10-22 SURGICAL SUPPLY — 15 items

## 2022-10-22 NOTE — Transfer of Care (Signed)
Immediate Anesthesia Transfer of Care Note  Patient: Francisco [name]  Procedure(s) Performed: ESOPHAGOGASTRODUODENOSCOPY (EGD) WITH PROPOFOL  Patient Location: PACU  Anesthesia Type:MAC  Level of Consciousness: awake, alert , and oriented  Airway & Oxygen Therapy: Patient Spontanous Breathing and Patient connected to face mask oxygen  Post-op Assessment: Report given to RN and Post -op Vital signs reviewed and stable  Post vital signs: Reviewed and stable  Last Vitals:  Vitals Value Taken Time  BP 103/52 10/22/22 1008  Temp 36.3 C 10/22/22 1008  Pulse 54 10/22/22 1009  Resp 18 10/22/22 1009  SpO2 100 % 10/22/22 1009  Vitals shown include unvalidated device data.  Last Pain:  Vitals:   10/22/22 1008  TempSrc: Oral  PainSc:          Complications: No notable events documented.

## 2022-10-22 NOTE — H&P (Addendum)
[facility] History and Physical   Primary Care Physician:  Yvonna Alanis, NP   Reason for Procedure:   Esophageal varices  Plan:    EGD     HPI: Francisco [name] is a 66 y.o. male here for surveillance of esophageal varices   Past Medical History:  Diagnosis Date   Allergy to [place] pig dander    Arthritis    "hands and knees the most; mild" (03/15/2017)   Benign neoplasm of colon 07/19/2014   07/19/14 - 2 adenomas - repeat colonoscopy 2020    Cirrhosis ([place])    Diverticulosis 2008   Epilepsy ([place])    Esophageal varices with bleeding ([place]) 07/19/2014   Falls related to headaches    Fatty liver 2011   Abd [name]   Headaches thought due to old head injury    Hepatic cirrhosis due to chronic hepatitis C infection ([place]) and some component of alcohol use 07/19/2014   Hepatitis C    History of blood transfusion 1987   "related to the zygomatic arch fracture"   Iron deficiency anemia secondary to blood loss (chronic) 07/19/2014   Portal hypertensive gastropathy ([place]) 07/19/2014   Scoliosis    Seizures ([place])    Torn rotator cuff     Past Surgical History:  Procedure Laterality Date   ABDOMINAL SURGERY     Knife puncture to Stomach   COLONOSCOPY  2008   COLONOSCOPY N/A 07/19/2014   Procedure: COLONOSCOPY;  Surgeon: Gatha Mayer, MD;  Location: [place];  Service: Endoscopy;  Laterality: N/A;   COLONOSCOPY WITH PROPOFOL N/A 06/19/2021   Procedure: COLONOSCOPY WITH PROPOFOL;  Surgeon: Gatha Mayer, MD;  Location: [facility] ENDOSCOPY;  Service: Endoscopy;  Laterality: N/A;   ESOPHAGEAL BANDING  04/14/2017   Procedure: ESOPHAGEAL BANDING;  Surgeon: Gatha Mayer, MD;  Location: [facility] ENDOSCOPY;  Service: Endoscopy;;   ESOPHAGOGASTRODUODENOSCOPY N/A 07/19/2014   Procedure: ESOPHAGOGASTRODUODENOSCOPY (EGD);  Surgeon: Gatha Mayer, MD;  Location: [facility] ENDOSCOPY;  Service: Endoscopy;  Laterality: N/A;   ESOPHAGOGASTRODUODENOSCOPY N/A 09/05/2014   Procedure:  ESOPHAGOGASTRODUODENOSCOPY (EGD);  Surgeon: Gatha Mayer, MD;  Location: [facility];  Service: Endoscopy;  Laterality: N/A;   ESOPHAGOGASTRODUODENOSCOPY N/A 10/17/2014   Procedure: ESOPHAGOGASTRODUODENOSCOPY (EGD);  Surgeon: Gatha Mayer, MD;  Location: [facility];  Service: Endoscopy;  Laterality: N/A;   ESOPHAGOGASTRODUODENOSCOPY N/A 12/19/2014   Procedure: ESOPHAGOGASTRODUODENOSCOPY (EGD);  Surgeon: Gatha Mayer, MD;  Location: [facility];  Service: Endoscopy;  Laterality: N/A;   ESOPHAGOGASTRODUODENOSCOPY N/A 03/16/2017   Procedure: ESOPHAGOGASTRODUODENOSCOPY (EGD);  Surgeon: Ladene Artist, MD;  Location: [facility] ENDOSCOPY;  Service: Endoscopy;  Laterality: N/A;   ESOPHAGOGASTRODUODENOSCOPY (EGD) WITH PROPOFOL N/A 11/21/2015   Procedure: ESOPHAGOGASTRODUODENOSCOPY (EGD) WITH PROPOFOL;  Surgeon: Gatha Mayer, MD;  Location: [place];  Service: Endoscopy;  Laterality: N/A;   ESOPHAGOGASTRODUODENOSCOPY (EGD) WITH PROPOFOL N/A 04/14/2017   Procedure: ESOPHAGOGASTRODUODENOSCOPY (EGD) WITH PROPOFOL;  Surgeon: Gatha Mayer, MD;  Location: [facility];  Service: Endoscopy;  Laterality: N/A;   ESOPHAGOGASTRODUODENOSCOPY (EGD) WITH PROPOFOL N/A 07/23/2017   Procedure: ESOPHAGOGASTRODUODENOSCOPY (EGD) WITH PROPOFOL;  Surgeon: Gatha Mayer, MD;  Location: [facility];  Service: Endoscopy;  Laterality: N/A;   ESOPHAGOGASTRODUODENOSCOPY (EGD) WITH PROPOFOL N/A 06/19/2021   Procedure: ESOPHAGOGASTRODUODENOSCOPY (EGD) WITH PROPOFOL;  Surgeon: Gatha Mayer, MD;  Location: [facility];  Service: Endoscopy;  Laterality: N/A;   FRACTURE SURGERY Right    cheek   GASTRIC VARICES BANDING N/A 10/17/2014   Procedure: GASTRIC VARICES BANDING;  Surgeon: Glendell Docker  Simonne Maffucci, MD;  Location: [facility];  Service: Endoscopy;  Laterality: N/A;   ORIF ZYGOMATIC FRACTURE Right 1987   RADIUS SYNOSTOSIS PROXIMAL RESECTION W/ ALLOGRAFT Right 1980s   at the elbow   THYROIDECTOMY, PARTIAL Left 2012    Prior  to Admission medications   Medication Sig Start Date End Date Taking? Authorizing Provider  Fluticasone-Umeclidin-Vilant (TRELEGY ELLIPTA) 200-62.5-25 MCG/ACT AEPB Inhale 1 puff into the lungs daily. 06/09/22  Yes Mannam, Praveen, MD  levETIRAcetam (KEPPRA) 500 MG tablet Take 1 tablet (500 mg total) by mouth 2 (two) times daily. 04/21/22  Yes Jaffe, Adam R, DO  methocarbamol (ROBAXIN) 500 MG tablet TAKE 0.5 TABLETS (250 MG TOTAL) BY MOUTH 4 (FOUR) TIMES DAILY. 06/16/22  Yes Fargo, Amy E, NP  Multiple Vitamins-Minerals (MULTIVITAMIN WITH MINERALS) tablet Take 1 tablet by mouth daily.   Yes [provider]  Pirfenidone (ESBRIET) 801 MG TABS Take 801 mg by mouth 3 (three) times daily.   Yes [provider]  albuterol (VENTOLIN HFA) 108 (90 Base) MCG/ACT inhaler TAKE 2 PUFFS BY MOUTH EVERY 6 HOURS AS NEEDED FOR WHEEZE OR SHORTNESS OF BREATH 05/08/22   Fargo, Amy E, NP    No current facility-administered medications for this encounter.   Facility-Administered Medications Ordered in Other Encounters  Medication Dose Route Frequency Provider Last Rate Last Admin   lactated ringers infusion   Intravenous Continuous PRN Williford, Jinger Neighbors, CRNA   New Bag at 10/22/22 0910    Allergies as of 08/26/2022 - Review Complete 08/26/2022  Allergen Reaction Noted   Naproxen Other (See Comments) 01/30/2016   Nsaids  04/09/2017   Other  02/16/2022    Family History  Problem Relation Age of Onset   Breast cancer Mother 17   Crohn's disease Father 105   Arthritis/Rheumatoid Father    Hearing loss Father    Epilepsy Sister 63   Ankylosing spondylitis Brother 56   Heart disease Maternal Grandmother    Alcohol abuse Maternal Grandfather    Cancer Paternal Grandfather        type unknown, mets   Alcohol abuse Maternal Uncle        several   Depression Daughter 45    Social History   Socioeconomic History   Marital status: Divorced    Spouse name: Not on file   Number of children: 1    Years of education: some college   Highest education level: Not on file  Occupational History   Occupation: Secretary/administrator- retired    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  Tobacco Use   Smoking status: Every Day    Packs/day: 0.50    Years: 47.00    Total pack years: 23.50    Types: Cigarettes    Start date: 05/08/1974    Last attempt to quit: 03/04/2022    Years since quitting: 0.6   Smokeless tobacco: Never   Tobacco comments:    Smokes 3-4 cigs a day 10/08/22  Vaping Use   Vaping Use: Never used  Substance and Sexual Activity   Alcohol use: No    Comment: 03/15/2017 "used to drink considerably; quit 08/27/14"   Drug use: No   Sexual activity: Not Currently  Other Topics Concern   Not on file  Social History Narrative   Divorced, one daughter   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 Lives with his dog in a 1 story home Father and his dog moved 2020.    Right handed   One story home         Marital status:   Divorced                            What year were you married ? 1990      Highest Level of education completed: 2/1/2 Years college      Current or past profession: Health and safety inspector       Do you exercise?   Yes                           Type & how often Walk every day  ( 1/2-2 miles) [employer] work      ADVANCED DIRECTIVES (Please bring copies)      Do you have a living will? Yes      Do you have a DNR form?   Yes                    If not, do you want to discuss one?       Do you have signed POA?HPOA forms? Yes                If so, please bring to your appointment      FUNCTIONAL STATUS- To be completed by Spouse / child / Staff       Do you have difficulty bathing or dressing yourself ?  No      Do you have difficulty preparing food or eating ?  No      Do you have difficulty managing your mediation ?  No      Do  you have difficulty managing your finances ?  No      Do you have difficulty affording your medication ?  No      Social Determinants of Radio broadcast assistant Strain: Not on file  Food Insecurity: Not on file  Transportation Needs: Not on file  Physical Activity: Not on file  Stress: Not on file  Social Connections: Not on file  Intimate Partner Violence: Not on file    Review of Systems:  All other review of systems negative except as mentioned in the HPI.  Physical Exam: Vital signs BP (!) 118/59   Temp (!) 97.3 F (36.3 C) (Temporal)   Resp 16   Ht 6' (1.829 m)   Wt 93 kg   SpO2 98%   BMI 27.80 kg/m   General:   Alert,  Well-developed, well-nourished, pleasant and cooperative in NAD Lungs:  Clear throughout to auscultation.   Heart:  Regular rate and rhythm; no murmurs, clicks, rubs,  or gallops. Abdomen:  Soft, nontender and nondistended. Normal bowel sounds.  Small soft reducible umbilical hernia Neuro/Psych:  Alert and cooperative. Normal mood and affect. A and O x 3   '@Francisco [name]'$  Simonne Maffucci, MD, [facility] [phone] (pager) 10/22/2022 9:44 AM@

## 2022-10-22 NOTE — Anesthesia Preprocedure Evaluation (Addendum)
Anesthesia Evaluation  Patient identified by MRN, date of birth, ID band Patient awake    Reviewed: Allergy & Precautions, NPO status , Patient's Chart, lab work & pertinent test results  Airway Mallampati: II  TM Distance: >3 FB Neck ROM: Full    Dental  (+) Dental Advisory Given, Partial Upper   Pulmonary Current Smoker and Patient abstained from smoking.   Pulmonary exam normal breath sounds clear to auscultation       Cardiovascular negative cardio ROS Normal cardiovascular exam Rhythm:Regular Rate:Normal     Neuro/Psych  Headaches, Seizures -,   negative psych ROS   GI/Hepatic ,,,(+) Cirrhosis   Esophageal Varices    , Hepatitis -, CPortal hypertensive gastropathy   Endo/Other  negative endocrine ROS    Renal/GU negative Renal ROS     Musculoskeletal  (+) Arthritis ,    Abdominal   Peds  Hematology negative hematology ROS (+)   Anesthesia Other Findings Day of surgery medications reviewed with the patient.  Reproductive/Obstetrics                             Anesthesia Physical Anesthesia Plan  ASA: 3  Anesthesia Plan: MAC   Post-op Pain Management:    Induction: Intravenous  PONV Risk Score and Plan: 0 and Propofol infusion  Airway Management Planned: Nasal Cannula and Natural Airway  Additional Equipment:   Intra-op Plan:   Post-operative Plan:   Informed Consent: I have reviewed the patients History and Physical, chart, labs and discussed the procedure including the risks, benefits and alternatives for the proposed anesthesia with the patient or authorized representative who has indicated his/her understanding and acceptance.     Dental advisory given  Plan Discussed with: CRNA and Anesthesiologist  Anesthesia Plan Comments:        Anesthesia Quick Evaluation

## 2022-10-22 NOTE — Discharge Instructions (Addendum)
The varices look the same and so does the stomach (portal gastropathy).    I am checking with Dr. Vaughan Browner to see if trying carvedilol is ok to use in your situation (this is the medication to reduce chances of varices vbleeding).  Once I hear back from him will let you know.  I appreciate the opportunity to care for you. Gatha Mayer, MD, FACG  YOU HAD AN ENDOSCOPIC PROCEDURE TODAY: Refer to the procedure report and other information in the discharge instructions given to you for any specific questions about what was found during the examination. If this information does not answer your questions, please call Dr. Celesta Aver office at [phone] to clarify.   YOU SHOULD EXPECT: Some feelings of bloating in the abdomen. Passage of more gas than usual. Walking can help get rid of the air that was put into your GI tract during the procedure and reduce the bloating. If you had a lower endoscopy (such as a colonoscopy or flexible sigmoidoscopy) you may notice spotting of blood in your stool or on the toilet paper. Some abdominal soreness may be present for a day or two, also.  DIET: Your first meal following the procedure should be a light meal and then it is ok to progress to your normal diet. A half-sandwich or bowl of soup is an example of a good first meal. Heavy or fried foods are harder to digest and may make you feel nauseous or bloated. Drink plenty of fluids but you should avoid alcoholic beverages for 24 hours.   ACTIVITY: Your care partner should take you home directly after the procedure. You should plan to take it easy, moving slowly for the rest of the day. You can resume normal activity the day after the procedure however YOU SHOULD NOT DRIVE, use power tools, machinery or perform tasks that involve climbing or major physical exertion for 24 hours (because of the sedation medicines used during the test).   SYMPTOMS TO REPORT IMMEDIATELY: A gastroenterologist can be reached at any hour.  Please call [phone]  for any of the following symptoms:   Following upper endoscopy (EGD, EUS, ERCP, esophageal dilation) Vomiting of blood or coffee ground material  New, significant abdominal pain  New, significant chest pain or pain under the shoulder blades  Painful or persistently difficult swallowing  New shortness of breath  Black, tarry-looking or red, bloody stools

## 2022-10-22 NOTE — Op Note (Signed)
[facility] Patient Name: Francisco [name] Procedure Date: 10/22/2022 MRN: [id number] Attending MD: Gatha Mayer , MD, [id number] Date of Birth: [birth year] CSN: [id number] Age: 66 Admit Type: Outpatient Procedure:                Upper GI endoscopy Indications:              Esophageal varices, Follow-up of esophageal varices Providers:                Gatha Mayer, MD, Daine Gravel, RN, Mikey College, RN, Darliss Cheney, Technician, [facility], CRNA Referring MD:              Medicines:                Monitored Anesthesia Care Complications:            No immediate complications. Estimated Blood Loss:     Estimated blood loss: none. Procedure:                Pre-Anesthesia Assessment:                           - Prior to the procedure, a History and Physical                            was performed, and patient medications and                            allergies were reviewed. The patient's tolerance of                            previous anesthesia was also reviewed. The risks                            and benefits of the procedure and the sedation                            options and risks were discussed with the patient.                            All questions were answered, and informed consent                            was obtained. Prior Anticoagulants: The patient has                            taken no anticoagulant or antiplatelet agents. ASA                            Grade Assessment: III - A patient with severe  systemic disease. After reviewing the risks and                            benefits, the patient was deemed in satisfactory                            condition to undergo the procedure.                           After obtaining informed consent, the endoscope was                            passed under direct vision. Throughout the                             procedure, the patient's blood pressure, pulse, and                            oxygen saturations were monitored continuously. The                            GIF-H190 ([id number]) Olympus endoscope was introduced                            through the mouth, and advanced to the second part                            of duodenum. The upper GI endoscopy was                            accomplished without difficulty. The patient                            tolerated the procedure well. Scope In: Scope Out: Findings:      Grade I, small (< 5 mm) varices were found in the lower third of the       esophagus. They were 4 mm in largest diameter.      Moderate portal hypertensive gastropathy was found in the entire       examined stomach.      The examined duodenum was normal.      The cardia and gastric fundus were normal on retroflexion.      The exam was otherwise without abnormality. Impression:               - Grade I and small (< 5 mm) esophageal varices.                           - Portal hypertensive gastropathy.                           - Normal examined duodenum.                           - The examination was otherwise normal.                           -  No specimens collected. Moderate Sedation:      Not Applicable - Patient had care per Anesthesia. Recommendation:           - Patient has a contact number available for                            emergencies. The signs and symptoms of potential                            delayed complications were discussed with the                            patient. Return to normal activities tomorrow.                            Written discharge instructions were provided to the                            patient.                           - Resume previous diet.                           - Continue present medications.                           - Will consider carvedilol if ok with pulmonary                           He had banding of varices  2015 w/ prior melena but                            no clear evidence that they were bleeding then so I                            think ok to try carvediolol and forgo surveillance                            EGD Procedure Code(s):        --- Professional ---                           [phone], Esophagogastroduodenoscopy, flexible,                            transoral; diagnostic, including collection of                            specimen(s) by brushing or washing, when performed                            (separate procedure) Diagnosis Code(s):        --- Professional ---  I85.00, Esophageal varices without bleeding                           K76.6, Portal hypertension                           K31.89, Other diseases of stomach and duodenum CPT copyright 2022 American Medical Association. All rights reserved. The codes documented in this report are preliminary and upon coder review may  be revised to meet current compliance requirements. Gatha Mayer, MD 10/22/2022 10:21:39 AM This report has been signed electronically. Number of Addenda: 0

## 2022-10-22 NOTE — Anesthesia Postprocedure Evaluation (Signed)
Anesthesia Post Note  Patient: Franciscojavier Wronski Saar  Procedure(s) Performed: ESOPHAGOGASTRODUODENOSCOPY (EGD) WITH PROPOFOL     Patient location during evaluation: Endoscopy Anesthesia Type: MAC Level of consciousness: oriented, awake and alert and awake Pain management: pain level controlled Vital Signs Assessment: post-procedure vital signs reviewed and stable Respiratory status: spontaneous breathing, nonlabored ventilation, respiratory function stable and patient connected to nasal cannula oxygen Cardiovascular status: blood pressure returned to baseline and stable Postop Assessment: no headache, no backache and no apparent nausea or vomiting Anesthetic complications: no   No notable events documented.  Last Vitals:  Vitals:   10/22/22 1020 10/22/22 1027  BP: 105/63 106/63  Pulse: (!) 56 (!) 55  Resp: 14 13  Temp:    SpO2: 96% 99%    Last Pain:  Vitals:   10/22/22 1027  TempSrc:   PainSc: 0-No pain                 Santa Lighter

## 2022-10-23 ENCOUNTER — Ambulatory Visit: Payer: Medicare Other | Admitting: Neurology

## 2022-10-25 ENCOUNTER — Encounter (HOSPITAL_COMMUNITY): Payer: Self-pay | Admitting: Internal Medicine

## 2022-10-29 ENCOUNTER — Telehealth: Payer: Self-pay | Admitting: Internal Medicine

## 2022-10-29 MED ORDER — CARVEDILOL 3.125 MG PO TABS: 3.1250 mg | ORAL_TABLET | Freq: Two times a day (BID) | ORAL | 0 refills | Status: DC

## 2022-10-29 NOTE — Telephone Encounter (Signed)
Please contact patient and explained that Dr. Vaughan Browner of pulmonary was okay with starting carvedilol to try to prevent bleeding from esophageal varices as I discussed with Elta Guadeloupe after his EGD last week.  I have sent  prescription at the starting dose which can be sent in and he needs to come in in about a week for a pulse and blood pressure check and then I will advise on sticking with that dose versus increasing it.  I have only ordered 1 months worth

## 2022-10-30 NOTE — Telephone Encounter (Signed)
Pt made aware of Dr. Carlean Purl recommendations: Pt was scheduled for a Pulse/BP check next Friday: 11/06/2022 at 1:00 PM: Pt made aware Pt verbalized understanding with all questions answered.

## 2022-11-02 ENCOUNTER — Ambulatory Visit (INDEPENDENT_AMBULATORY_CARE_PROVIDER_SITE_OTHER): Payer: Medicare Other | Admitting: Neurology

## 2022-11-02 DIAGNOSIS — R55 Syncope and collapse: Secondary | ICD-10-CM | POA: Diagnosis not present

## 2022-11-02 DIAGNOSIS — G40309 Generalized idiopathic epilepsy and epileptic syndromes, not intractable, without status epilepticus: Secondary | ICD-10-CM

## 2022-11-06 ENCOUNTER — Telehealth: Payer: Self-pay | Admitting: Internal Medicine

## 2022-11-06 ENCOUNTER — Ambulatory Visit: Payer: Medicare Other | Admitting: Internal Medicine

## 2022-11-06 MED ORDER — CARVEDILOL 3.125 MG PO TABS: 3.1250 mg | ORAL_TABLET | Freq: Two times a day (BID) | ORAL | 3 refills | Status: DC

## 2022-11-06 NOTE — Telephone Encounter (Signed)
Spoke to patient about blood pressure and pulse check now on carvedilol.  He feels fine.  We will continue this dose long-term prescription sent in, this is for variceal bleeding prophylaxis.

## 2022-11-17 NOTE — Procedures (Signed)
Patient's Name: Francisco [name] MRN: [id number] Date of Birth: 10/19/1956   Ordering Provider: Metta Clines, MD DX CODE(s): R55, G40.309 EXAM DURATION: 46 hours   CLINICAL HISTORY: This is a 66 year old man with episodes of dizziness, unable to move his legs. History of drop attacks. EEG for classification.   MEDICATION(s): Levetiracetam, Methocarbamol, Carvedilol, Esbriet   TECHNICAL DESCRIPTION: Long-Term EEG with Video was monitored intermittently by a qualified EEG technologist for the entirety of the recording; quality check-ins were performed at a minimum of every two hours, checking, and documenting real-time data and video to assure the integrity and quality of the recording (e.g., camera position, electrode integrity and impedance), and identify the need for maintenance. For intermittent monitoring, an EEG Technologist monitored no more than 12 patients concurrently. Video was being recorded at least 80% of the time during the study duration, unless otherwise noted in an Exception Statement.   At the end of the recording, the EEG Technologist generates a technical description, which is the EEG Technologist's written documentation of the reviewed video-EEG data, including technical interventions and these elements: reviewing raw EEG/VEEG data and events and automated detection as well as patient pushbutton event activations; and annotating, editing, and archiving EEG/VEEG data for review by the physician or other qualified healthcare professional. For review, the Video EEG recording can be visualized in all standard types of montages, 16 channels and greater, and playbacks include digital high frequency filters previously noted. The Video EEG has been notated with patient typical symptom events at the direction of the patient by depressing a push button mounted on a waist worn Lifelines EEG recording device. Digital spike and seizure detection software was used to identify potential abnormalities  in the EEG, and alerts were reviewed and annotated by the technologist in the [employer] EEG Review software. Video EEG and report are notated with events that were determined to be of significance by the digital analysis software showing spike and seizure detections.   SET-UP TECH: Jerene Pitch   RECORDING SET-UP DATE: 10/31/2022 1:40PM RECORDING TAKE-DOWN DATE: 10/02/2022 12:31PM    SPIKE AND SEIZURE ANALYSIS AND REVIEW: Spike and seizure detection software alerts have been reviewed by a Production designer, theatre/television/film. Some of these alerts appear to have clinical significance.  PUSH BUTTON EVENTS: A patient diary was maintained. The patient did not press button, nor did they describe any typical symptoms. 3 button presses were test, accidental, or monitoring tech driven (Resets).     DESCRIPTION OF RECORDING: During maximal wakefulness, the background activity consisted of a symmetric 8 Hz posterior dominant rhythm that was reactive to eye opening and eye closure. The background is symmetric. There were no epileptiform discharges or electrographic seizures seen in wakefulness.   During the recording, the patient progresses through wakefulness, drowsiness, and sleep. Vertex waves and sleep spindles were seen. There were no epileptiform discharges or focal slowing seen.  There were frequent extrasystolic beats on EKG lead.   PUSH BUTTON EVENTS: There were no push button events.     IMPRESSION: This 46-hour ambulatory video EEG study is normal.    CLINICAL CORRELATION: A normal EEG does not exclude a clinical diagnosis of epilepsy. Typical events were not captured. If further clinical questions remain, inpatient video EEG monitoring may be helpful.

## 2022-11-21 ENCOUNTER — Other Ambulatory Visit: Payer: Self-pay | Admitting: Neurology

## 2023-01-11 ENCOUNTER — Telehealth: Payer: Self-pay

## 2023-01-11 ENCOUNTER — Other Ambulatory Visit: Payer: Self-pay

## 2023-01-11 DIAGNOSIS — B182 Chronic viral hepatitis C: Secondary | ICD-10-CM

## 2023-01-11 NOTE — Telephone Encounter (Signed)
Pt made aware of personal reminder received in Epic that Dr. Carlean Purl want for his to repeat the [name] in 6 months: Pt scheduled for 01/14/2023 at 9:30 AM at [facility]. Pt to arrive at 9:00 AM. Pt to have no thing to eat or drink past midnight. Pt made aware: Pt verbalized understanding with all questions answered.

## 2023-01-11 NOTE — Telephone Encounter (Signed)
Message Received: 3 days ago Gillermina Hu, RN  Gillermina Hu, RN Gatha Mayer, MD  Gillermina Hu, RN Let him know that things are stable on the [name] - no signs of tumors - good news  He needs:  1) Reminder to repeat this [name] test in 6 mos 2) Office visit me non-urgent next available 3) Let him know that I put him on our hospital procedure list - he is due for a repeat EGD which we will work on setting up when possible laterthis year  Sent 07/08/2022

## 2023-01-14 ENCOUNTER — Ambulatory Visit (HOSPITAL_COMMUNITY)
Admission: RE | Admit: 2023-01-14 | Discharge: 2023-01-14 | Disposition: A | Discharge status: Home / Self Care | Payer: Medicare Other | Source: Ambulatory Visit | Attending: Internal Medicine | Admitting: Internal Medicine

## 2023-01-14 DIAGNOSIS — R161 Splenomegaly, not elsewhere classified: Secondary | ICD-10-CM | POA: Diagnosis not present

## 2023-01-14 DIAGNOSIS — B182 Chronic viral hepatitis C: Secondary | ICD-10-CM

## 2023-01-14 DIAGNOSIS — K7460 Unspecified cirrhosis of liver: Secondary | ICD-10-CM | POA: Insufficient documentation

## 2023-01-14 DIAGNOSIS — K8020 Calculus of gallbladder without cholecystitis without obstruction: Secondary | ICD-10-CM | POA: Diagnosis not present
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
## 2023-01-22 ENCOUNTER — Ambulatory Visit: Payer: Medicare Other | Admitting: Neurology

## 2023-01-22 ENCOUNTER — Encounter: Payer: Self-pay | Admitting: Neurology

## 2023-01-22 VITALS — BP 110/70 | HR 94 | Resp 20 | Ht 72.0 in | Wt 209.0 lb

## 2023-01-22 DIAGNOSIS — G40309 Generalized idiopathic epilepsy and epileptic syndromes, not intractable, without status epilepticus: Secondary | ICD-10-CM

## 2023-01-22 MED ORDER — LEVETIRACETAM 500 MG PO TABS: 500.0000 mg | ORAL_TABLET | Freq: Two times a day (BID) | ORAL | 3 refills | Status: DC

## 2023-01-22 NOTE — Patient Instructions (Signed)
Continue Keppra '500mg'$  twice daily

## 2023-03-11 ENCOUNTER — Encounter: Payer: Self-pay | Admitting: Orthopedic Surgery

## 2023-03-11 ENCOUNTER — Ambulatory Visit (INDEPENDENT_AMBULATORY_CARE_PROVIDER_SITE_OTHER): Payer: Medicare Other | Admitting: Orthopedic Surgery

## 2023-03-11 VITALS — BP 122/74 | HR 58 | Temp 97.5°F | Ht 72.0 in | Wt 208.6 lb

## 2023-03-11 DIAGNOSIS — F419 Anxiety disorder, unspecified: Secondary | ICD-10-CM | POA: Diagnosis not present

## 2023-03-11 DIAGNOSIS — I8511 Secondary esophageal varices with bleeding: Secondary | ICD-10-CM

## 2023-03-11 DIAGNOSIS — D696 Thrombocytopenia, unspecified: Secondary | ICD-10-CM | POA: Diagnosis not present

## 2023-03-11 DIAGNOSIS — F32A Depression, unspecified: Secondary | ICD-10-CM

## 2023-03-11 DIAGNOSIS — J849 Interstitial pulmonary disease, unspecified: Secondary | ICD-10-CM | POA: Diagnosis not present

## 2023-03-11 DIAGNOSIS — K7460 Unspecified cirrhosis of liver: Secondary | ICD-10-CM

## 2023-03-11 DIAGNOSIS — G40309 Generalized idiopathic epilepsy and epileptic syndromes, not intractable, without status epilepticus: Secondary | ICD-10-CM

## 2023-03-11 DIAGNOSIS — E663 Overweight: Secondary | ICD-10-CM

## 2023-03-11 DIAGNOSIS — Z23 Encounter for immunization: Secondary | ICD-10-CM | POA: Diagnosis not present
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
## 2023-03-11 NOTE — Patient Instructions (Addendum)
Recommend RSV vaccine at local pharmacy  Also consider Tdap and shingles vaccine

## 2023-03-12 LAB — COMPLETE METABOLIC PANEL WITH GFR
AG Ratio: 1.3 (calc) (ref 1.0–2.5)
ALT: 16 U/L (ref 9–46)
AST: 21 U/L (ref 10–35)
Albumin: 4 g/dL (ref 3.6–5.1)
Alkaline phosphatase (APISO): 107 U/L (ref 35–144)
BUN/Creatinine Ratio: 14 (calc) (ref 6–22)
BUN: 9 mg/dL (ref 7–25)
CO2: 24 mmol/L (ref 20–32)
Calcium: 10 mg/dL (ref 8.6–10.3)
Chloride: 107 mmol/L (ref 98–110)
Creat: 0.63 mg/dL — ABNORMAL LOW (ref 0.70–1.35)
Globulin: 3.2 g/dL (calc) (ref 1.9–3.7)
Glucose, Bld: 86 mg/dL (ref 65–99)
Potassium: 4 mmol/L (ref 3.5–5.3)
Sodium: 139 mmol/L (ref 135–146)
Total Bilirubin: 1.2 mg/dL (ref 0.2–1.2)
Total Protein: 7.2 g/dL (ref 6.1–8.1)
eGFR: 104 mL/min/{1.73_m2} (ref >=60)

## 2023-03-12 LAB — CBC WITH DIFFERENTIAL/PLATELET
Absolute Monocytes: 340 cells/uL (ref 200–950)
Basophils Absolute: 22 cells/uL (ref 0–200)
Basophils Relative: 0.5 %
Eosinophils Absolute: 112 cells/uL (ref 15–500)
Eosinophils Relative: 2.6 %
HCT: 39.4 % (ref 38.5–50.0)
Hemoglobin: 13.9 g/dL (ref 13.2–17.1)
Lymphs Abs: 890 cells/uL (ref 850–3900)
MCH: 31.2 pg (ref 27.0–33.0)
MCHC: 35.3 g/dL (ref 32.0–36.0)
MCV: 88.5 fL (ref 80.0–100.0)
MPV: 9.4 fL (ref 7.5–12.5)
Monocytes Relative: 7.9 %
Neutro Abs: 2937 cells/uL (ref 1500–7800)
Neutrophils Relative %: 68.3 %
Platelets: 72 10*3/uL — ABNORMAL LOW (ref 140–400)
RBC: 4.45 10*6/uL (ref 4.20–5.80)
RDW: 13.6 % (ref 11.0–15.0)
Total Lymphocyte: 20.7 %
WBC: 4.3 10*3/uL (ref 3.8–10.8)

## 2023-03-18 ENCOUNTER — Telehealth: Payer: Self-pay | Admitting: Pulmonary Disease

## 2023-03-19 ENCOUNTER — Other Ambulatory Visit: Payer: Self-pay

## 2023-03-19 MED ORDER — TRELEGY ELLIPTA 200-62.5-25 MCG/ACT IN AEPB: 1.0000 | INHALATION_SPRAY | Freq: Every day | RESPIRATORY_TRACT | 5 refills | Status: DC

## 2023-03-19 NOTE — Telephone Encounter (Signed)
Front desk team received call from [facility] stating that it has been 10 months since pt's last Esbriet shipment. Per [employer] portal, an order for a 48-month supply was placed on 04/28/22 and was expected to be delivered on 04/30/22; pt would have been due for refill 07/2022.   There does not appear to any documentation that ongoing Esbriet therapy was discussed with pt directly in the most recent chart notes (specifically 10/08/22 with Dr. Vaughan Browner and 03/11/23 with Dr. Cleophas Dunker), however both providers appear to be under the impression that there has not been any interruption to therapy and there has been no talks of discontinuation of medication thus far.  Attempted to call pt to discuss, left VoiceMail requesting a return call. Direct office number provided. Will await f/u.

## 2023-03-19 NOTE — Telephone Encounter (Signed)
Pt returned call. He states he had initially discontinued the medication because he felt like it was not working for him, however he re-titrated himself back on to medication once he had a f/u appt and saw that HRCT scans showed otherwise. He then reached out to [facility] when he required a refill, which then prompted them to contact the office.   Contacted [facility] and provided clarification on the situation to a pharmacist. Explained that pt self-titrated starting by taking one (1) 801mg  tablet for seven days, then increased to one (1) 801mg  tablet twice daily for seven days, and then resumed regular maintenance dose of one (1) 801mg  tablet three times daily. The pharmacist agreed that, despite doing incorrect, the patient would be considered successfully titrated back to maintenance dose.  The order that the pt had initially place has since been canceled, however the pharmacist stated that he would put a note on the patient's chart that they were cleared for refill. He stated that the pt would need to call [facility] once more to schedule shipment. The remaining fill on file for patient is a 3 month supply, however the pt will be out of refills at this point.   Contacted pt and informed him that he will need to reach out to [facility] once more to schedule shipment, pt verbalized understanding.

## 2023-03-19 NOTE — Telephone Encounter (Signed)
Spoke with patient to verify pharmacy. Refill for Trelegy has been sent to pharmacy. Nothing further needed.

## 2023-03-30 NOTE — Telephone Encounter (Signed)
Trelegy prescription  was sent to [facility] 5/85/92 with 5 additional refills. I called patient to make sure he was receiving his Trelegy.  Patient stated he [facility].  Nothing further needed.

## 2023-04-01 ENCOUNTER — Ambulatory Visit (INDEPENDENT_AMBULATORY_CARE_PROVIDER_SITE_OTHER): Payer: Medicare Other | Admitting: Orthopedic Surgery

## 2023-04-01 ENCOUNTER — Encounter: Payer: Self-pay | Admitting: Orthopedic Surgery

## 2023-04-01 VITALS — BP 110/70 | HR 63 | Temp 97.4°F | Resp 16 | Ht 72.0 in | Wt 204.2 lb

## 2023-04-01 DIAGNOSIS — Z0000 Encounter for general adult medical examination without abnormal findings: Secondary | ICD-10-CM | POA: Diagnosis not present

## 2023-04-01 NOTE — Patient Instructions (Signed)
  [name] , Thank you for taking time to come for your Medicare Wellness Visit. I appreciate your ongoing commitment to your health goals. Please review the following plan we discussed and let me know if I can assist you in the future.   These are the goals we discussed:  Goals      Maintain Mobility and Function     Evidence-based guidance:  Acknowledge and validate impact of pain, loss of strength and potential disfigurement (hand osteoarthritis) on mental health and daily life, such as social isolation, anxiety, depression, impaired sexual relationship and   injury from falls.  Anticipate referral to physical or occupational therapy for assessment, therapeutic exercise and recommendation for adaptive equipment or assistive devices; encourage participation.  Assess impact on ability to perform activities of daily living, as well as engage in sports and leisure events or requirements of work or school.  Provide anticipatory guidance and reassurance about the benefit of exercise to maintain function; acknowledge and normalize fear that exercise may worsen symptoms.  Encourage regular exercise, at least 10 minutes at a time for 45 minutes per week; consider yoga, water exercise and proprioceptive exercises; encourage use of wearable activity tracker to increase motivation and adherence.  Encourage maintenance or resumption of daily activities, including employment, as pain allows and with minimal exposure to trauma.  Assist patient to advocate for adaptations to the work environment.  Consider level of pain and function, gender, age, lifestyle, patient preference, quality of life, readiness and ?ocapacity to benefit? when recommending patients for orthopaedic surgery consultation.  Explore strategies, such as changes to medication regimen or activity that enables patient to anticipate and manage flare-ups that increase deconditioning and disability.  Explore patient preferences; encourage  exposure to a broader range of activities that have been avoided for fear of experiencing pain.  Identify barriers to participation in therapy or exercise, such as pain with activity, anticipated or imagined pain.  Monitor postoperative joint replacement or any preexisting joint replacement for ongoing pain and loss of function; provide social support and encouragement throughout recovery.   Notes:         This is a list of the screening recommended for you and due dates:  Health Maintenance  Topic Date Due   DTaP/Tdap/Td vaccine (1 - Tdap) Never done   Zoster (Shingles) Vaccine (1 of 2) Never done   COVID-19 Vaccine (5 - 2023-24 season) 04/22/2029*   Flu Shot  07/22/2023   Screening for Lung Cancer  09/03/2023   Medicare Annual Wellness Visit  03/31/2024   Colon Cancer Screening  06/20/2031   Pneumonia Vaccine  Completed   Hepatitis C Screening: USPSTF Recommendation to screen - Ages 18-79 yo.  Completed   HPV Vaccine  Aged Out  *Topic was postponed. The date shown is not the original due date.

## 2023-04-01 NOTE — Progress Notes (Signed)
Subjective:   Francisco [name] is a 67 y.o. male who presents for Medicare Annual/Subsequent preventive examination.  Review of Systems     Cardiac Risk Factors include: hypertension;male gender;advanced age (>12men, >44 women)     Objective:    Today's Vitals   04/01/23 1000  BP: 110/70  Pulse: 63  Resp: 16  Temp: (!) 97.4 F (36.3 C)  SpO2: 95%  Weight: 204 lb 3.2 oz (92.6 kg)  Height: 6' (1.829 m)   Body mass index is 27.69 kg/m.     04/01/2023   10:06 AM 03/11/2023    1:10 PM 01/22/2023    2:02 PM 10/22/2022    8:31 AM 08/21/2022    8:36 AM 04/21/2022    1:42 PM 04/15/2022    3:29 PM  Advanced Directives  Does Patient Have a Medical Advance Directive? Yes Yes Yes Yes Yes Yes Yes  Type of Estate agent of [place];Living will Healthcare Power of [place];Living will  Healthcare Power of [place];Living will Living will;Healthcare Power of [employer] Power of [place];Living will Healthcare Power of Attorney  Does patient want to make changes to medical advance directive? No - Patient declined No - Patient declined     No - Patient declined  Copy of Healthcare Power of Attorney in Chart? Yes - validated most recent copy scanned in chart (See row information) Yes - validated most recent copy scanned in chart (See row information)  No - copy requested   Yes - validated most recent copy scanned in chart (See row information)    Current Medications (verified) Outpatient Encounter Medications as of 04/01/2023  Medication Sig   carvedilol (COREG) 3.125 MG tablet Take 1 tablet (3.125 mg total) by mouth 2 (two) times daily with a meal.   Fluticasone-Umeclidin-Vilant (TRELEGY ELLIPTA) 200-62.5-25 MCG/ACT AEPB Inhale 1 puff into the lungs daily.   levETIRAcetam (KEPPRA) 500 MG tablet Take 1 tablet (500 mg total) by mouth 2 (two) times daily.   Multiple Vitamins-Minerals (MULTIVITAMIN WITH MINERALS) tablet Take 1 tablet by mouth daily.   Pirfenidone  (ESBRIET) 801 MG TABS Take 801 mg by mouth 3 (three) times daily.   No facility-administered encounter medications on file as of 04/01/2023.    Allergies (verified) Other   History: Past Medical History:  Diagnosis Date   Allergy to [place] pig dander    Arthritis    "hands and knees the most; mild" (03/15/2017)   Benign neoplasm of colon 07/19/2014   07/19/14 - 2 adenomas - repeat colonoscopy 2020    Cirrhosis    Diverticulosis 2008   Epilepsy    Esophageal varices with bleeding 07/19/2014   Falls related to headaches    Fatty liver 2011   Abd [name]   Headaches thought due to old head injury    Hepatic cirrhosis due to chronic hepatitis C infection (HCC) and some component of alcohol use 07/19/2014   Hepatitis C    History of blood transfusion 1987   "related to the zygomatic arch fracture"   Iron deficiency anemia secondary to blood loss (chronic) 07/19/2014   Portal hypertensive gastropathy 07/19/2014   Scoliosis    Seizures    Torn rotator cuff    Past Surgical History:  Procedure Laterality Date   ABDOMINAL SURGERY     Knife puncture to Stomach   COLONOSCOPY  2008   COLONOSCOPY N/A 07/19/2014   Procedure: COLONOSCOPY;  Surgeon: Iva Boop, MD;  Location: [facility] ENDOSCOPY;  Service: Endoscopy;  Laterality: N/A;  COLONOSCOPY WITH PROPOFOL N/A 06/19/2021   Procedure: COLONOSCOPY WITH PROPOFOL;  Surgeon: Iva Boop, MD;  Location: [facility] ENDOSCOPY;  Service: Endoscopy;  Laterality: N/A;   ESOPHAGEAL BANDING  04/14/2017   Procedure: ESOPHAGEAL BANDING;  Surgeon: Iva Boop, MD;  Location: [facility] ENDOSCOPY;  Service: Endoscopy;;   ESOPHAGOGASTRODUODENOSCOPY N/A 07/19/2014   Procedure: ESOPHAGOGASTRODUODENOSCOPY (EGD);  Surgeon: Iva Boop, MD;  Location: [facility] ENDOSCOPY;  Service: Endoscopy;  Laterality: N/A;   ESOPHAGOGASTRODUODENOSCOPY N/A 09/05/2014   Procedure: ESOPHAGOGASTRODUODENOSCOPY (EGD);  Surgeon: Iva Boop, MD;  Location: [facility] ENDOSCOPY;  Service: Endoscopy;   Laterality: N/A;   ESOPHAGOGASTRODUODENOSCOPY N/A 10/17/2014   Procedure: ESOPHAGOGASTRODUODENOSCOPY (EGD);  Surgeon: Iva Boop, MD;  Location: [facility] ENDOSCOPY;  Service: Endoscopy;  Laterality: N/A;   ESOPHAGOGASTRODUODENOSCOPY N/A 12/19/2014   Procedure: ESOPHAGOGASTRODUODENOSCOPY (EGD);  Surgeon: Iva Boop, MD;  Location: [facility] ENDOSCOPY;  Service: Endoscopy;  Laterality: N/A;   ESOPHAGOGASTRODUODENOSCOPY N/A 03/16/2017   Procedure: ESOPHAGOGASTRODUODENOSCOPY (EGD);  Surgeon: Meryl Dare, MD;  Location: [facility] ENDOSCOPY;  Service: Endoscopy;  Laterality: N/A;   ESOPHAGOGASTRODUODENOSCOPY (EGD) WITH PROPOFOL N/A 11/21/2015   Procedure: ESOPHAGOGASTRODUODENOSCOPY (EGD) WITH PROPOFOL;  Surgeon: Iva Boop, MD;  Location: [facility] ENDOSCOPY;  Service: Endoscopy;  Laterality: N/A;   ESOPHAGOGASTRODUODENOSCOPY (EGD) WITH PROPOFOL N/A 04/14/2017   Procedure: ESOPHAGOGASTRODUODENOSCOPY (EGD) WITH PROPOFOL;  Surgeon: Iva Boop, MD;  Location: [facility] ENDOSCOPY;  Service: Endoscopy;  Laterality: N/A;   ESOPHAGOGASTRODUODENOSCOPY (EGD) WITH PROPOFOL N/A 07/23/2017   Procedure: ESOPHAGOGASTRODUODENOSCOPY (EGD) WITH PROPOFOL;  Surgeon: Iva Boop, MD;  Location: [facility] ENDOSCOPY;  Service: Endoscopy;  Laterality: N/A;   ESOPHAGOGASTRODUODENOSCOPY (EGD) WITH PROPOFOL N/A 06/19/2021   Procedure: ESOPHAGOGASTRODUODENOSCOPY (EGD) WITH PROPOFOL;  Surgeon: Iva Boop, MD;  Location: [facility] ENDOSCOPY;  Service: Endoscopy;  Laterality: N/A;   ESOPHAGOGASTRODUODENOSCOPY (EGD) WITH PROPOFOL N/A 10/22/2022   Procedure: ESOPHAGOGASTRODUODENOSCOPY (EGD) WITH PROPOFOL;  Surgeon: Iva Boop, MD;  Location: [facility] ENDOSCOPY;  Service: Gastroenterology;  Laterality: N/A;   FRACTURE SURGERY Right    cheek   GASTRIC VARICES BANDING N/A 10/17/2014   Procedure: GASTRIC VARICES BANDING;  Surgeon: Iva Boop, MD;  Location: [facility] ENDOSCOPY;  Service: Endoscopy;  Laterality: N/A;   ORIF ZYGOMATIC FRACTURE Right 1987   RADIUS  SYNOSTOSIS PROXIMAL RESECTION W/ ALLOGRAFT Right 1980s   at the elbow   THYROIDECTOMY, PARTIAL Left 2012   Family History  Problem Relation Age of Onset   Breast cancer Mother 70   Crohn's disease Father 59   Arthritis/Rheumatoid Father    Hearing loss Father    Epilepsy Sister 65   Ankylosing spondylitis Brother 61   Heart disease Maternal Grandmother    Alcohol abuse Maternal Grandfather    Cancer Paternal Grandfather        type unknown, mets   Alcohol abuse Maternal Uncle        several   Depression Daughter 105   Social History   Socioeconomic History   Marital status: Divorced    Spouse name: Not on file   Number of children: 1   Years of education: some college   Highest education level: Some college, no degree  Occupational History   Occupation: Publishing copy- retired    Associate Professor: [employer]    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  Tobacco Use   Smoking status: Every Day    Packs/day: 0.50    Years: 47.00    Additional pack years: 0.00    Total pack years: 23.50    Types: Cigarettes  Start date: 05/08/1974    Last attempt to quit: 03/04/2022    Years since quitting: 1.0   Smokeless tobacco: Never   Tobacco comments:    Smokes 3-4 cigs a day 10/08/22  Vaping Use   Vaping Use: Never used  Substance and Sexual Activity   Alcohol use: No    Comment: 03/15/2017 "used to drink considerably; quit 08/27/14"   Drug use: No   Sexual activity: Not Currently  Other Topics Concern   Not on file  Social History Narrative   Divorced, one daughter   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 Lives with his dog in a 1 story home Father and his dog moved 2020.    Right handed   One story home         Marital status:   Divorced                            What year were you married ? 1990      Highest Level of education completed: 2/1/2 Years college       Current or past profession: Art therapist       Do you exercise?   Yes                           Type & how often Walk every day  ( 1/2-2 miles) [employer] work      ADVANCED DIRECTIVES (Please bring copies)      Do you have a living will? Yes      Do you have a DNR form?   Yes                    If not, do you want to discuss one?       Do you have signed POA?HPOA forms? Yes                If so, please bring to your appointment      FUNCTIONAL STATUS- To be completed by Spouse / child / Staff       Do you have difficulty bathing or dressing yourself ?  No      Do you have difficulty preparing food or eating ?  No      Do you have difficulty managing your mediation ?  No      Do you have difficulty managing your finances ?  No      Do you have difficulty affording your medication ?  No      Social Determinants of Health   Financial Resource Strain: Low Risk  (04/01/2023)   Overall Financial Resource Strain (CARDIA)    Difficulty of Paying Living Expenses: Not hard at all  Food Insecurity: No Food Insecurity (04/01/2023)   Hunger Vital Sign    Worried About Running Out of Food in the Last Year: Never true    Ran Out of Food in the Last Year: Never true  Transportation Needs: No Transportation Needs (04/01/2023)   PRAPARE - Administrator, Civil Service (Medical): No    Lack of Transportation (Non-Medical): No  Physical Activity: Sufficiently Active (04/01/2023)   Exercise Vital Sign    Days of Exercise per Week: 7 days    Minutes of Exercise per Session:  60 min  Recent Concern: Physical Activity - Insufficiently Active (03/09/2023)   Exercise Vital Sign    Days of Exercise per Week: 2 days    Minutes of Exercise per Session: 40 min  Stress: Stress Concern Present (04/01/2023)   [employer] - Occupational Stress Questionnaire    Feeling of Stress : To some extent  Social Connections: Unknown (04/01/2023)   Social Connection and  Isolation Panel [NHANES]    Frequency of Communication with Friends and Family: More than three times a week    Frequency of Social Gatherings with Friends and Family: Twice a week    Attends Religious Services: Patient declined    Database administratorActive Member of Clubs or Organizations: No    Attends Engineer, structuralClub or Organization Meetings: Never    Marital Status: Divorced    Tobacco Counseling Ready to quit: Not Answered Counseling given: Not Answered Tobacco comments: Smokes 3-4 cigs a day 10/08/22   Clinical Intake:  Pre-visit preparation completed: No  Pain : No/denies pain     BMI - recorded: 27.69 Nutritional Status: BMI 25 -29 Overweight Nutritional Risks: None Diabetes: No  How often do you need to have someone help you when you read instructions, pamphlets, or other written materials from your doctor or pharmacy?: 1 - Never What is the last grade level you completed in school?: some college  Diabetic?No  Interpreter Needed?: No      Activities of Daily Living    04/01/2023   10:22 AM  In your present state of health, do you have any difficulty performing the following activities:  Hearing? 0  Vision? 0  Difficulty concentrating or making decisions? 0  Walking or climbing stairs? 0  Dressing or bathing? 0  Doing errands, shopping? 0  Preparing Food and eating ? N  Using the Toilet? N  In the past six months, have you accidently leaked urine? N  Do you have problems with loss of bowel control? N  Managing your Medications? N  Managing your Finances? N  Housekeeping or managing your Housekeeping? N    Patient Care Team: Octavia HeirFargo, Alleigh Mollica E, NP as PCP - General (Adult Health Nurse Practitioner) Drema DallasJaffe, Adam R, DO as Consulting Physician (Neurology)  Indicate any recent Medical Services you may have received from other than [facility] providers in the past year (date may be approximate).     Assessment:   This is a routine wellness examination for [employer].  Hearing/Vision  screen Hearing Screening - Comments:: No hearing concerns.  Vision Screening - Comments:: No vision concerns. Patient wears reading glasses. Patient hasn't ever been to eye Dr.   Lois Huxleyietary issues and exercise activities discussed: Current Exercise Habits: The patient does not participate in regular exercise at present, Exercise limited by: cardiac condition(s)   Goals Addressed             This Visit's Progress    Maintain Mobility and Function   On track    Evidence-based guidance:  Acknowledge and validate impact of pain, loss of strength and potential disfigurement (hand osteoarthritis) on mental health and daily life, such as social isolation, anxiety, depression, impaired sexual relationship and   injury from falls.  Anticipate referral to physical or occupational therapy for assessment, therapeutic exercise and recommendation for adaptive equipment or assistive devices; encourage participation.  Assess impact on ability to perform activities of daily living, as well as engage in sports and leisure events or requirements of work or school.  Provide anticipatory guidance and reassurance  about the benefit of exercise to maintain function; acknowledge and normalize fear that exercise may worsen symptoms.  Encourage regular exercise, at least 10 minutes at a time for 45 minutes per week; consider yoga, water exercise and proprioceptive exercises; encourage use of wearable activity tracker to increase motivation and adherence.  Encourage maintenance or resumption of daily activities, including employment, as pain allows and with minimal exposure to trauma.  Assist patient to advocate for adaptations to the work environment.  Consider level of pain and function, gender, age, lifestyle, patient preference, quality of life, readiness and ?ocapacity to benefit? when recommending patients for orthopaedic surgery consultation.  Explore strategies, such as changes to medication regimen or activity  that enables patient to anticipate and manage flare-ups that increase deconditioning and disability.  Explore patient preferences; encourage exposure to a broader range of activities that have been avoided for fear of experiencing pain.  Identify barriers to participation in therapy or exercise, such as pain with activity, anticipated or imagined pain.  Monitor postoperative joint replacement or any preexisting joint replacement for ongoing pain and loss of function; provide social support and encouragement throughout recovery.   Notes:        Depression Screen    04/01/2023   10:22 AM 04/01/2023   10:20 AM 04/01/2023   10:00 AM 02/21/2021    1:29 PM 06/26/2015    1:01 PM 05/02/2015    9:00 AM 01/31/2015    2:36 PM  PHQ 2/9 Scores  PHQ - 2 Score 0 0 0 0 0 0 0    Fall Risk    04/01/2023   10:21 AM 04/01/2023   10:00 AM 01/22/2023    2:02 PM 09/15/2022    1:18 PM 08/21/2022    8:36 AM  Fall Risk   Falls in the past year? 0 0 0 0 1  Number falls in past yr: 0 0 0 0 0  Injury with Fall? 0 0 0 0 0  Risk for fall due to : No Fall Risks No Fall Risks     Follow up Falls evaluation completed;Education provided;Falls prevention discussed Falls evaluation completed Falls evaluation completed      FALL RISK PREVENTION PERTAINING TO THE HOME:  Any stairs in or around the home? Yes  If so, are there any without handrails? No  Home free of loose throw rugs in walkways, pet beds, electrical cords, etc? Yes  Adequate lighting in your home to reduce risk of falls? Yes   ASSISTIVE DEVICES UTILIZED TO PREVENT FALLS:  Life alert? No  Use of a cane, walker or w/c? No  Grab bars in the bathroom? No  Shower chair or bench in shower? Yes  Elevated toilet seat or a handicapped toilet? Yes   TIMED UP AND GO:  Was the test performed? No .  Length of time to ambulate 10 feet: N/A sec.   Gait steady and fast without use of assistive device  Cognitive Function:    04/01/2023   10:02 AM 03/12/2022     1:00 PM  MMSE - Mini Mental State Exam  Orientation to time 5 5  Orientation to Place 5 5  Registration 3 3  Attention/ Calculation 5 5  Recall 3 3  Language- name 2 objects 2 2  Language- repeat 1 1  Language- follow 3 step command 3 3  Language- read & follow direction 1 1  Write a sentence 1 1  Copy design 1 1  Total score 30 30  Immunizations Immunization History  Administered Date(s) Administered   Fluad Quad(high Dose 65+) 10/08/2022   Hep A / Hep B 09/12/2014, 10/12/2014, 03/14/2015   Influenza,inj,Quad PF,6+ Mos 10/09/2014   Influenza-Unspecified 09/20/2021   PFIZER(Purple Top)SARS-COV-2 Vaccination 03/03/2020, 03/25/2020   PNEUMOCOCCAL CONJUGATE-20 03/11/2023   Pfizer Covid-19 Vaccine Bivalent Booster 42yrs & up 12/04/2020, 09/30/2021   Pneumococcal Conjugate-13 07/06/2017   Pneumococcal Polysaccharide-23 10/09/2014    TDAP status: Due, Education has been provided regarding the importance of this vaccine. Advised may receive this vaccine at local pharmacy or Health Dept. Aware to provide a copy of the vaccination record if obtained from local pharmacy or Health Dept. Verbalized acceptance and understanding.  Flu Vaccine status: Up to date  Pneumococcal vaccine status: Up to date  Covid-19 vaccine status: Completed vaccines  Qualifies for Shingles Vaccine? Yes   Zostavax completed No   Shingrix Completed?: No.    Education has been provided regarding the importance of this vaccine. Patient has been advised to call insurance company to determine out of pocket expense if they have not yet received this vaccine. Advised may also receive vaccine at local pharmacy or Health Dept. Verbalized acceptance and understanding.  Screening Tests Health Maintenance  Topic Date Due   DTaP/Tdap/Td (1 - Tdap) Never done   Zoster Vaccines- Shingrix (1 of 2) Never done   COVID-19 Vaccine (5 - 2023-24 season) 04/22/2029 (Originally 08/21/2022)   INFLUENZA VACCINE  07/22/2023    Lung Cancer Screening  09/03/2023   Medicare Annual Wellness (AWV)  03/31/2024   COLONOSCOPY (Pts 45-71yrs Insurance coverage will need to be confirmed)  06/20/2031   Pneumonia Vaccine 43+ Years old  Completed   Hepatitis C Screening  Completed   HPV VACCINES  Aged Out    Health Maintenance  Health Maintenance Due  Topic Date Due   DTaP/Tdap/Td (1 - Tdap) Never done   Zoster Vaccines- Shingrix (1 of 2) Never done    Colorectal cancer screening: Type of screening: Colonoscopy. Completed 2022. Repeat every 5 years  Lung Cancer Screening: (Low Dose CT Chest recommended if Age 50-80 years, 30 pack-year currently smoking OR have quit w/in 15years.) does qualify. > completed by pulmonary   Lung Cancer Screening Referral: no  Additional Screening:  Hepatitis C Screening: does not qualify; Completed   Vision Screening: Recommended annual ophthalmology exams for early detection of glaucoma and other disorders of the eye. Is the patient up to date with their annual eye exam?  Yes  Who is the provider or what is the name of the office in which the patient attends annual eye exams? No provider If pt is not established with a provider, would they like to be referred to a provider to establish care? No .   Dental Screening: Recommended annual dental exams for proper oral hygiene  Community Resource Referral / Chronic Care Management: CRR required this visit?  No   CCM required this visit?  No      Plan:     I have personally reviewed and noted the following in the patient's chart:   Medical and social history Use of alcohol, tobacco or illicit drugs  Current medications and supplements including opioid prescriptions. Patient is not currently taking opioid prescriptions. Functional ability and status Nutritional status Physical activity Advanced directives List of other physicians Hospitalizations, surgeries, and ER visits in previous 12 months Vitals Screenings to include  cognitive, depression, and falls Referrals and appointments  In addition, I have reviewed and discussed with patient certain preventive  protocols, quality metrics, and best practice recommendations. A written personalized care plan for preventive services as well as general preventive health recommendations were provided to patient.     Octavia Heir, NP   04/01/2023   Nurse Notes: Recommend Tdap, Shingrix and Prevnar 20 vaccines at local pharmacy

## 2023-07-16 ENCOUNTER — Telehealth: Payer: Self-pay

## 2023-07-16 NOTE — Telephone Encounter (Signed)
Received fax from [place] stating that pt is approved to continue receiving Esbriet through PAP in 2025. Sent to scan center for retention.

## 2023-07-23 ENCOUNTER — Other Ambulatory Visit: Payer: Self-pay

## 2023-07-23 DIAGNOSIS — K7460 Unspecified cirrhosis of liver: Secondary | ICD-10-CM

## 2023-07-31 ENCOUNTER — Ambulatory Visit (HOSPITAL_COMMUNITY)
Admission: RE | Admit: 2023-07-31 | Discharge: 2023-07-31 | Disposition: A | Discharge status: Home / Self Care | Payer: Medicare Other | Source: Ambulatory Visit | Attending: Internal Medicine | Admitting: Internal Medicine

## 2023-07-31 DIAGNOSIS — B182 Chronic viral hepatitis C: Secondary | ICD-10-CM | POA: Diagnosis present

## 2023-07-31 DIAGNOSIS — K8020 Calculus of gallbladder without cholecystitis without obstruction: Secondary | ICD-10-CM | POA: Diagnosis not present

## 2023-07-31 DIAGNOSIS — K7460 Unspecified cirrhosis of liver: Secondary | ICD-10-CM | POA: Insufficient documentation

## 2023-07-31 DIAGNOSIS — K7689 Other specified diseases of liver: Secondary | ICD-10-CM | POA: Diagnosis not present

## 2023-07-31 DIAGNOSIS — R161 Splenomegaly, not elsewhere classified: Secondary | ICD-10-CM | POA: Diagnosis not present

## 2023-08-06 ENCOUNTER — Ambulatory Visit (INDEPENDENT_AMBULATORY_CARE_PROVIDER_SITE_OTHER): Payer: Medicare Other | Admitting: Pulmonary Disease

## 2023-08-06 DIAGNOSIS — J849 Interstitial pulmonary disease, unspecified: Secondary | ICD-10-CM

## 2023-08-06 LAB — PULMONARY FUNCTION TEST
DL/VA % pred: 58 %
DL/VA: 2.36 ml/min/mmHg/L
DLCO cor % pred: 56 %
DLCO cor: 15.85 ml/min/mmHg
DLCO unc % pred: 56 %
DLCO unc: 15.85 ml/min/mmHg
FEF 25-75 Post: 4.23 L/s
FEF 25-75 Pre: 3.77 L/s
FEF2575-%Change-Post: 12 %
FEF2575-%Pred-Post: 150 %
FEF2575-%Pred-Pre: 134 %
FEV1-%Change-Post: 3 %
FEV1-%Pred-Post: 101 %
FEV1-%Pred-Pre: 98 %
FEV1-Post: 3.67 L
FEV1-Pre: 3.55 L
FEV1FVC-%Change-Post: 1 %
FEV1FVC-%Pred-Pre: 108 %
FEV6-%Change-Post: 1 %
FEV6-%Pred-Post: 96 %
FEV6-%Pred-Pre: 95 %
FEV6-Post: 4.46 L
FEV6-Pre: 4.41 L
FEV6FVC-%Change-Post: 0 %
FEV6FVC-%Pred-Post: 104 %
FEV6FVC-%Pred-Pre: 105 %
FVC-%Change-Post: 1 %
FVC-%Pred-Post: 92 %
FVC-%Pred-Pre: 90 %
FVC-Post: 4.49 L
FVC-Pre: 4.42 L
Post FEV1/FVC ratio: 82 %
Post FEV6/FVC ratio: 99 %
Pre FEV1/FVC ratio: 81 %
Pre FEV6/FVC Ratio: 100 %
RV % pred: 35 %
RV: 0.89 L
TLC % pred: 79 %
TLC: 5.87 L

## 2023-08-06 NOTE — Patient Instructions (Signed)
Full PFT performed today. °

## 2023-08-06 NOTE — Progress Notes (Signed)
Full PFT performed today. °

## 2023-08-11 ENCOUNTER — Encounter: Payer: Self-pay | Admitting: Pulmonary Disease

## 2023-08-11 ENCOUNTER — Ambulatory Visit: Payer: Medicare Other | Admitting: Pulmonary Disease

## 2023-08-11 VITALS — BP 112/64 | HR 56 | Temp 98.0°F | Ht 72.0 in | Wt 203.6 lb

## 2023-08-11 DIAGNOSIS — J849 Interstitial pulmonary disease, unspecified: Secondary | ICD-10-CM

## 2023-08-11 DIAGNOSIS — Z5181 Encounter for therapeutic drug level monitoring: Secondary | ICD-10-CM

## 2023-08-11 LAB — COMPREHENSIVE METABOLIC PANEL
ALT: 21 U/L (ref 0–53)
AST: 27 U/L (ref 0–37)
Albumin: 4 g/dL (ref 3.5–5.2)
Alkaline Phosphatase: 96 U/L (ref 39–117)
BUN: 13 mg/dL (ref 6–23)
CO2: 27 mEq/L (ref 19–32)
Calcium: 10.1 mg/dL (ref 8.4–10.5)
Chloride: 108 mEq/L (ref 96–112)
Creatinine, Ser: 0.65 mg/dL (ref 0.40–1.50)
GFR: 97.55 mL/min (ref >60.00)
Glucose, Bld: 96 mg/dL (ref 70–99)
Potassium: 4 mEq/L (ref 3.5–5.1)
Sodium: 139 mEq/L (ref 135–145)
Total Bilirubin: 0.9 mg/dL (ref 0.2–1.2)
Total Protein: 7.3 g/dL (ref 6.0–8.3)

## 2023-08-11 NOTE — Progress Notes (Signed)
Francisco [name]    [id number]    1956/10/23  Primary Care Physician:Fargo, Amy E, NP  Referring Physician: Octavia Heir, NP [address] [place],  [place] [address]  Chief complaint:  Follow-up for IPF Started Esbriet March 2023  HPI: 67 year old with history of allergies, hepatitis C, cirrhosis referred for evaluation of abnormal finding seen on CT scan. Screening CT in early 2022 showed mild subpleural reticulation.  Follow-up high-resolution CT in July 2022 showed pulmonary fibrosis with honeycombing and has been referred here for further evaluation next Has complaints of chest congestion, no dyspnea, has small joint pain, stiffness Has strong family history of CTD with rheumatoid arthritis in father and ankylosing spondylitis and brother He has consulted with Dr. Dimple Casey, rheumatology in February 2023 who does not feel there is any evidence of connective tissue disease.  He has history of cirrhosis secondary to hepatitis C which was treated with Harvoni.  Case was discussed at multidisciplinary conference on 09/02/2021 and CT findings was consistent with UIP fibrosis.  He has started Esbriet in March 2023.   Pets: No pets Occupation: Worked as a Production designer, theatre/television/film for a [employer], [employer].  Prior to that he worked in [employer] ILD questionnaire 08/18/2021-no known exposures.  No mold, hot tub, Jacuzzi.  No feather pillows or comforters.  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  Breathing is stable without any issue  Outpatient Encounter Medications as of 08/11/2023  Medication Sig   carvedilol (COREG) 3.125 MG tablet Take 1 tablet (3.125 mg total) by mouth 2 (two)  times daily with a meal.   Fluticasone-Umeclidin-Vilant (TRELEGY ELLIPTA) 200-62.5-25 MCG/ACT AEPB Inhale 1 puff into the lungs daily.   levETIRAcetam (KEPPRA) 500 MG tablet Take 1 tablet (500 mg total) by mouth 2 (two) times daily.   Multiple Vitamins-Minerals (MULTIVITAMIN WITH MINERALS) tablet Take 1 tablet by mouth daily.   Pirfenidone (ESBRIET) 801 MG TABS Take 801 mg by mouth 3 (three) times daily.   No facility-administered encounter medications on file as of 08/11/2023.    Physical Exam: Gen:      No acute distress HEENT:  EOMI, sclera anicteric Neck:     No masses; no thyromegaly Lungs:    Bibasal crackles CV:         Regular rate and rhythm; no murmurs Abd:      + bowel sounds; soft, non-tender; no palpable masses, no distension Ext:    No edema; adequate peripheral perfusion Skin:      Warm and dry; no rash Neuro: alert and oriented x 3 Psych: normal mood and affect   Data Reviewed: Imaging: CT lung cancer screening 04/05/2021-subpleural reticulation, calcified granuloma  High-resolution CT 06/27/2021-mild groundglass attenuation, subpleural reticulation with honeycombing most evident at the anterior aspect of the upper lobe, advanced cirrhosis  High-resolution CT chest 09/02/2021-slight progression pulmonary fibrosis in UIP pattern, cirrhosis I have reviewed the images personally  PFTs: 01/08/2022 FVC 4.49 [90%], FEV1 3.66 [98%], F/F 81, TLC 5.92 [79%], DLCO 15.19 [53%] Minimal restriction, moderate-severe diffusion defect  08/06/2023 FVC 4.49 [92%], FEV1 3.67 [101%], F/F82, TLC 5.87 [79%], DLCO 15.85 [56%] Moderate diffusion capacity  Labs: CTD serologies 08/18/2021-ANA 1:80, cytoplasmic, rheumatoid factor 16, and anti Jo 1-20  Assessment:  Follow-up for interstitial lung disease Case reviewed at multidisciplinary conference in September 2022 CT shows moderate patchy reticulation with traction bronchiectasis, honeycombing with basilar predominance Although there is  increased disease in the anterior aspect of the upper lobe this process is felt to be more bullous emphysema and not honeycombing.  Pattern is still consistent with UIP by ATS criteria and this could be IPF in the right clinical setting especially given significant clubbing  He does have strong family history of connective tissue disease with positive ANA, rheumatoid factor and Jo 1.  Rheumatology evaluation noted with no evidence of autoimmune process No significant exposures noted  Continue Esbriet.  Monitor labs He has history of hepatitis C, cirrhosis and will need close monitoring.   Emphysema He has significant emphysema on CT scan though no obstruction on spirometry.  Suspect that he does not have obstruction due to combined presence of emphysema and pulmonary fibrosis Continue Trelegy  Plan/Recommendations: Continue Esbriet, check labs for monitoring Continue Trelegy Follow-up in 6 months after CT scan   Chilton Greathouse MD [facility] 08/11/2023, 11:36 AM  CC: Octavia Heir, NP

## 2023-08-11 NOTE — Patient Instructions (Signed)
We reviewed your PFTs today Continue using the Esbriet Will check comprehensive metabolic panel today for monitoring Will get a high-resolution CT in 6 months Follow-up in 6 months after CT scan

## 2023-08-25 ENCOUNTER — Other Ambulatory Visit: Payer: Self-pay | Admitting: Internal Medicine

## 2023-08-25 NOTE — Telephone Encounter (Signed)
Please advise Sir, thank you. 

## 2023-09-06 ENCOUNTER — Other Ambulatory Visit: Payer: Medicare Other

## 2023-09-06 DIAGNOSIS — I8511 Secondary esophageal varices with bleeding: Secondary | ICD-10-CM

## 2023-09-06 DIAGNOSIS — K7460 Unspecified cirrhosis of liver: Secondary | ICD-10-CM

## 2023-09-06 DIAGNOSIS — E663 Overweight: Secondary | ICD-10-CM

## 2023-09-07 LAB — CBC WITH DIFFERENTIAL/PLATELET
Absolute Monocytes: 288 {cells}/uL (ref 200–950)
Basophils Absolute: 22 {cells}/uL (ref 0–200)
Basophils Relative: 0.6 %
Eosinophils Absolute: 169 {cells}/uL (ref 15–500)
Eosinophils Relative: 4.7 %
HCT: 38.2 % — ABNORMAL LOW (ref 38.5–50.0)
Hemoglobin: 13.1 g/dL — ABNORMAL LOW (ref 13.2–17.1)
Lymphs Abs: 1008 {cells}/uL (ref 850–3900)
MCH: 30.8 pg (ref 27.0–33.0)
MCHC: 34.3 g/dL (ref 32.0–36.0)
MCV: 89.9 fL (ref 80.0–100.0)
MPV: 9.6 fL (ref 7.5–12.5)
Monocytes Relative: 8 %
Neutro Abs: 2113 {cells}/uL (ref 1500–7800)
Neutrophils Relative %: 58.7 %
Platelets: 69 10*3/uL — ABNORMAL LOW (ref 140–400)
RBC: 4.25 10*6/uL (ref 4.20–5.80)
RDW: 13.8 % (ref 11.0–15.0)
Total Lymphocyte: 28 %
WBC: 3.6 10*3/uL — ABNORMAL LOW (ref 3.8–10.8)

## 2023-09-07 LAB — COMPLETE METABOLIC PANEL WITH GFR
AG Ratio: 1.2 (calc) (ref 1.0–2.5)
ALT: 17 U/L (ref 9–46)
AST: 23 U/L (ref 10–35)
Albumin: 3.8 g/dL (ref 3.6–5.1)
Alkaline phosphatase (APISO): 104 U/L (ref 35–144)
BUN/Creatinine Ratio: 17 (calc) (ref 6–22)
BUN: 11 mg/dL (ref 7–25)
CO2: 24 mmol/L (ref 20–32)
Calcium: 9.5 mg/dL (ref 8.6–10.3)
Chloride: 108 mmol/L (ref 98–110)
Creat: 0.66 mg/dL — ABNORMAL LOW (ref 0.70–1.35)
Globulin: 3.3 g/dL (ref 1.9–3.7)
Glucose, Bld: 88 mg/dL (ref 65–99)
Potassium: 4 mmol/L (ref 3.5–5.3)
Sodium: 139 mmol/L (ref 135–146)
Total Bilirubin: 0.6 mg/dL (ref 0.2–1.2)
Total Protein: 7.1 g/dL (ref 6.1–8.1)
eGFR: 103 mL/min/{1.73_m2} (ref >=60)

## 2023-09-07 LAB — LIPID PANEL
Cholesterol: 140 mg/dL (ref <200)
HDL: 55 mg/dL (ref >=40)
LDL Cholesterol (Calc): 73 mg/dL
Non-HDL Cholesterol (Calc): 85 mg/dL (ref <130)
Total CHOL/HDL Ratio: 2.5 (calc) (ref <5.0)
Triglycerides: 50 mg/dL (ref <150)

## 2023-09-09 ENCOUNTER — Encounter: Payer: Self-pay | Admitting: Orthopedic Surgery

## 2023-09-09 ENCOUNTER — Ambulatory Visit (INDEPENDENT_AMBULATORY_CARE_PROVIDER_SITE_OTHER): Payer: Medicare Other | Admitting: Orthopedic Surgery

## 2023-09-09 VITALS — BP 120/68 | HR 64 | Temp 97.9°F | Resp 12 | Ht 72.0 in | Wt 201.4 lb

## 2023-09-09 DIAGNOSIS — Z122 Encounter for screening for malignant neoplasm of respiratory organs: Secondary | ICD-10-CM | POA: Diagnosis not present

## 2023-09-09 DIAGNOSIS — M25561 Pain in right knee: Secondary | ICD-10-CM

## 2023-09-09 DIAGNOSIS — D696 Thrombocytopenia, unspecified: Secondary | ICD-10-CM

## 2023-09-09 DIAGNOSIS — K7460 Unspecified cirrhosis of liver: Secondary | ICD-10-CM | POA: Diagnosis not present

## 2023-09-09 DIAGNOSIS — Z23 Encounter for immunization: Secondary | ICD-10-CM | POA: Diagnosis not present

## 2023-09-09 DIAGNOSIS — M542 Cervicalgia: Secondary | ICD-10-CM | POA: Diagnosis not present

## 2023-09-09 DIAGNOSIS — G40309 Generalized idiopathic epilepsy and epileptic syndromes, not intractable, without status epilepticus: Secondary | ICD-10-CM

## 2023-09-09 DIAGNOSIS — F17210 Nicotine dependence, cigarettes, uncomplicated: Secondary | ICD-10-CM | POA: Diagnosis not present

## 2023-09-09 DIAGNOSIS — G8929 Other chronic pain: Secondary | ICD-10-CM | POA: Diagnosis not present

## 2023-09-09 DIAGNOSIS — J849 Interstitial pulmonary disease, unspecified: Secondary | ICD-10-CM | POA: Diagnosis not present

## 2023-09-09 NOTE — Progress Notes (Signed)
Careteam: Patient Care Team: Octavia Heir, NP as PCP - General (Adult Health Nurse Practitioner) Drema Dallas, DO as Consulting Physician (Neurology)  Seen by: Hazle Nordmann, AGNP-C  PLACE OF SERVICE:  [facility]  Advanced Directive information    Allergies  Allergen Reactions   Other Shortness Of Breath    Israel Pig - Upper Respiratory     Chief Complaint  Patient presents with   Follow-up    6 month follow up     HPI: Patient is a 67 y.o. male seen today for medical management of chronic.   Continue to have chronic neck pain daily. Began in 03/2022. 04/16/2022 xray neck revealed narrowing C5-6, C6-7. No radiation of pain or numbness.   Chronic right knee pain x 6 months> began after fall. Wearing brace helps.Taking ibuprofen every 3 days due to low platelets. Requesting orthopedic evaluation.   ILD/ IPF/emphysema> followed by Dr. Isaiah Serge. Recent PFT noted moderate diffusion capacity. Remains on Esbriet and Trelegy. Smoking about 4 cigarettes daily> last CT r/o lung cancer 2022. Prevnar 20 given 02/2023.   No recent seizures> remains on Keppra.   H/o hepatic cirrhosis> treated with Harvoni.   Flu vaccine given today.    Review of Systems:  Review of Systems  Constitutional: Negative.   HENT: Negative.    Eyes: Negative.   Respiratory:  Negative for cough, sputum production, shortness of breath and wheezing.   Cardiovascular:  Negative for chest pain and leg swelling.  Gastrointestinal:  Negative for abdominal pain and blood in stool.  Genitourinary:  Negative for dysuria and hematuria.  Musculoskeletal:  Positive for joint pain and neck pain. Negative for falls.  Skin: Negative.   Neurological:  Negative for dizziness, sensory change, weakness and headaches.  Psychiatric/Behavioral:  Negative for depression and memory loss. The patient is not nervous/anxious and does not have insomnia.     Past Medical History:  Diagnosis Date   Allergy to Israel pig  dander    Arthritis    "hands and knees the most; mild" (03/15/2017)   Benign neoplasm of colon 07/19/2014   07/19/14 - 2 adenomas - repeat colonoscopy 2020    Cirrhosis (HCC)    Diverticulosis 2008   Epilepsy (HCC)    Esophageal varices with bleeding (HCC) 07/19/2014   Falls related to headaches    Fatty liver 2011   Abd [name]   Headaches thought due to old head injury    Hepatic cirrhosis due to chronic hepatitis C infection (HCC) and some component of alcohol use 07/19/2014   Hepatitis C    History of blood transfusion 1987   "related to the zygomatic arch fracture"   Iron deficiency anemia secondary to blood loss (chronic) 07/19/2014   Portal hypertensive gastropathy (HCC) 07/19/2014   Scoliosis    Seizures (HCC)    Torn rotator cuff    Past Surgical History:  Procedure Laterality Date   ABDOMINAL SURGERY     Knife puncture to Stomach   COLONOSCOPY  2008   COLONOSCOPY N/A 07/19/2014   Procedure: COLONOSCOPY;  Surgeon: Iva Boop, MD;  Location: [facility] ENDOSCOPY;  Service: Endoscopy;  Laterality: N/A;   COLONOSCOPY WITH PROPOFOL N/A 06/19/2021   Procedure: COLONOSCOPY WITH PROPOFOL;  Surgeon: Iva Boop, MD;  Location: [facility] ENDOSCOPY;  Service: Endoscopy;  Laterality: N/A;   ESOPHAGEAL BANDING  04/14/2017   Procedure: ESOPHAGEAL BANDING;  Surgeon: Iva Boop, MD;  Location: [facility] ENDOSCOPY;  Service: Endoscopy;;   ESOPHAGOGASTRODUODENOSCOPY N/A 07/19/2014  Procedure: ESOPHAGOGASTRODUODENOSCOPY (EGD);  Surgeon: Iva Boop, MD;  Location: [facility] ENDOSCOPY;  Service: Endoscopy;  Laterality: N/A;   ESOPHAGOGASTRODUODENOSCOPY N/A 09/05/2014   Procedure: ESOPHAGOGASTRODUODENOSCOPY (EGD);  Surgeon: Iva Boop, MD;  Location: [facility] ENDOSCOPY;  Service: Endoscopy;  Laterality: N/A;   ESOPHAGOGASTRODUODENOSCOPY N/A 10/17/2014   Procedure: ESOPHAGOGASTRODUODENOSCOPY (EGD);  Surgeon: Iva Boop, MD;  Location: [facility] ENDOSCOPY;  Service: Endoscopy;  Laterality: N/A;    ESOPHAGOGASTRODUODENOSCOPY N/A 12/19/2014   Procedure: ESOPHAGOGASTRODUODENOSCOPY (EGD);  Surgeon: Iva Boop, MD;  Location: [facility] ENDOSCOPY;  Service: Endoscopy;  Laterality: N/A;   ESOPHAGOGASTRODUODENOSCOPY N/A 03/16/2017   Procedure: ESOPHAGOGASTRODUODENOSCOPY (EGD);  Surgeon: Meryl Dare, MD;  Location: [facility] ENDOSCOPY;  Service: Endoscopy;  Laterality: N/A;   ESOPHAGOGASTRODUODENOSCOPY (EGD) WITH PROPOFOL N/A 11/21/2015   Procedure: ESOPHAGOGASTRODUODENOSCOPY (EGD) WITH PROPOFOL;  Surgeon: Iva Boop, MD;  Location: [facility] ENDOSCOPY;  Service: Endoscopy;  Laterality: N/A;   ESOPHAGOGASTRODUODENOSCOPY (EGD) WITH PROPOFOL N/A 04/14/2017   Procedure: ESOPHAGOGASTRODUODENOSCOPY (EGD) WITH PROPOFOL;  Surgeon: Iva Boop, MD;  Location: [facility] ENDOSCOPY;  Service: Endoscopy;  Laterality: N/A;   ESOPHAGOGASTRODUODENOSCOPY (EGD) WITH PROPOFOL N/A 07/23/2017   Procedure: ESOPHAGOGASTRODUODENOSCOPY (EGD) WITH PROPOFOL;  Surgeon: Iva Boop, MD;  Location: [facility] ENDOSCOPY;  Service: Endoscopy;  Laterality: N/A;   ESOPHAGOGASTRODUODENOSCOPY (EGD) WITH PROPOFOL N/A 06/19/2021   Procedure: ESOPHAGOGASTRODUODENOSCOPY (EGD) WITH PROPOFOL;  Surgeon: Iva Boop, MD;  Location: [facility] ENDOSCOPY;  Service: Endoscopy;  Laterality: N/A;   ESOPHAGOGASTRODUODENOSCOPY (EGD) WITH PROPOFOL N/A 10/22/2022   Procedure: ESOPHAGOGASTRODUODENOSCOPY (EGD) WITH PROPOFOL;  Surgeon: Iva Boop, MD;  Location: [facility] ENDOSCOPY;  Service: Gastroenterology;  Laterality: N/A;   FRACTURE SURGERY Right    cheek   GASTRIC VARICES BANDING N/A 10/17/2014   Procedure: GASTRIC VARICES BANDING;  Surgeon: Iva Boop, MD;  Location: [facility] ENDOSCOPY;  Service: Endoscopy;  Laterality: N/A;   ORIF ZYGOMATIC FRACTURE Right 1987   RADIUS SYNOSTOSIS PROXIMAL RESECTION W/ ALLOGRAFT Right 1980s   at the elbow   THYROIDECTOMY, PARTIAL Left 2012   Social History:   reports that he has been smoking cigarettes. He started smoking about 49 years  ago. He has a 23.9 pack-year smoking history. He has never used smokeless tobacco. He reports that he does not drink alcohol and does not use drugs.  Family History  Problem Relation Age of Onset   Breast cancer Mother 31   Crohn's disease Father 83   Arthritis/Rheumatoid Father    Hearing loss Father    Epilepsy Sister 64   Ankylosing spondylitis Brother 80   Heart disease Maternal Grandmother    Alcohol abuse Maternal Grandfather    Cancer Paternal Grandfather        type unknown, mets   Alcohol abuse Maternal Uncle        several   Depression Daughter 13    Medications: Patient's Medications  New Prescriptions   No medications on file  Previous Medications   CARVEDILOL (COREG) 3.125 MG TABLET    TAKE 1 TABLET BY MOUTH TWICE A DAY WITH A MEAL   FLUTICASONE-UMECLIDIN-VILANT (TRELEGY ELLIPTA) 200-62.5-25 MCG/ACT AEPB    Inhale 1 puff into the lungs daily.   LEVETIRACETAM (KEPPRA) 500 MG TABLET    Take 1 tablet (500 mg total) by mouth 2 (two) times daily.   MULTIPLE VITAMINS-MINERALS (MULTIVITAMIN WITH MINERALS) TABLET    Take 1 tablet by mouth daily.   PIRFENIDONE (ESBRIET) 801 MG TABS    Take 801 mg by mouth 3 (three) times daily.  Modified  Medications   No medications on file  Discontinued Medications   No medications on file    Physical Exam:  There were no vitals filed for this visit. There is no height or weight on file to calculate BMI. Wt Readings from Last 3 Encounters:  08/11/23 203 lb 9.6 oz (92.4 kg)  04/01/23 204 lb 3.2 oz (92.6 kg)  03/11/23 208 lb 9.6 oz (94.6 kg)    Physical Exam Vitals reviewed.  Constitutional:      General: He is not in acute distress. HENT:     Head: Normocephalic.  Eyes:     General:        Right eye: No discharge.        Left eye: No discharge.  Cardiovascular:     Rate and Rhythm: Normal rate and regular rhythm.     Pulses: Normal pulses.     Heart sounds: Normal heart sounds.  Pulmonary:     Effort: Pulmonary effort  is normal. No respiratory distress.     Breath sounds: Normal breath sounds. No wheezing.     Comments: clubbing Abdominal:     General: Bowel sounds are normal.     Palpations: Abdomen is soft.  Musculoskeletal:     Cervical back: Neck supple.     Right knee: Crepitus present. No swelling. Normal range of motion. Tenderness present over the MCL.     Right lower leg: No edema.     Left lower leg: No edema.  Skin:    General: Skin is warm.     Capillary Refill: Capillary refill takes less than 2 seconds.  Neurological:     General: No focal deficit present.     Mental Status: He is alert and oriented to person, place, and time.  Psychiatric:        Mood and Affect: Mood normal.     Labs reviewed: Basic Metabolic Panel: Recent Labs    03/11/23 1341 08/11/23 1155 09/06/23 0912  NA 139 139 139  K 4.0 4.0 4.0  CL 107 108 108  CO2 24 27 24   GLUCOSE 86 96 88  BUN 9 13 11   CREATININE 0.63* 0.65 0.66*  CALCIUM 10.0 10.1 9.5   Liver Function Tests: Recent Labs    10/02/22 1242 03/11/23 1341 08/11/23 1155 09/06/23 0912  AST 23 21 27 23   ALT 18 16 21 17   ALKPHOS 94  --  96  --   BILITOT 1.2 1.2 0.9 0.6  PROT 7.6 7.2 7.3 7.1  ALBUMIN 4.2  --  4.0  --    No results for input(s): "LIPASE", "AMYLASE" in the last 8760 hours. No results for input(s): "AMMONIA" in the last 8760 hours. CBC: Recent Labs    10/02/22 1242 03/11/23 1341 09/06/23 0912  WBC 4.0 4.3 3.6*  NEUTROABS 2.9 2,937 2,113  HGB 14.0 13.9 13.1*  HCT 39.7 39.4 38.2*  MCV 91.5 88.5 89.9  PLT 51* 72* 69*   Lipid Panel: Recent Labs    09/06/23 0912  CHOL 140  HDL 55  LDLCALC 73  TRIG 50  CHOLHDL 2.5   TSH: No results for input(s): "TSH" in the last 8760 hours. A1C: No results found for: "HGBA1C"   Assessment/Plan 1. Need for influenza vaccination - Flu Vaccine Trivalent High Dose (Fluad)  2. Chronic neck pain - began 2023 - 04/16/2022 narrowing C5-6, C6-7 - no radiation or numbness -  Ambulatory referral to Orthopedic Surgery  3. Chronic pain of right knee - began after  fall  - some MCL tenderness, creptius - limited pain interventions due to h/o cirrhosis and thrombocytopenia - Ambulatory referral to Orthopedic Surgery  4. Continuous dependence on cigarette smoking - admits to smoking 4 cigarettes daily - discussed cessation due to ILD/IPF/emphysema - CT CHEST LUNG CA SCREEN LOW DOSE W/O CM; Future  5. Screening for lung cancer - CT CHEST LUNG CA SCREEN LOW DOSE W/O CM; Future  6. Interstitial lung disease (HCC) - followed by pulmonary - recent PFT showed moderate diffusion capacity - cont Trelegy and Esbriet  7. Nonintractable generalized idiopathic epilepsy without status epilepticus (HCC) - followed by neurology - no recent seizures - cont Keppra  8. Cirrhosis of liver without ascites, unspecified hepatic cirrhosis type (HCC) - followed by Dr. Leone Payor - treated with Harvoni  9. Thrombocytopenia (HCC) - platelets 69 09/06/2023 - avoid NSAIDS  Total time: 36 minutes. Greater than 50% of total time spent doing patient education regarding health maintenance, smoking cessation, neck/knee pain, lab results and smoking cessation including symptom/medication management.   Next appt: Visit date not found  Isaiha Asare Scherry Ran  [facility] [phone]

## 2023-09-09 NOTE — Patient Instructions (Addendum)
Try Voltaren gel to right knee and neck> its just topical ibuprofen  Try heat to neck and ice to knee> 20 minute increments a few times daily   Ambulatory referral to orthopedics  CT for lung cancer screening made with [facility]  Consider getting tetanus (tdap), shingles (Shingrix) and covid vaccine this fall  Cut down on smoking

## 2023-09-18 ENCOUNTER — Telehealth: Payer: Medicare Other | Admitting: Family Medicine

## 2023-09-18 DIAGNOSIS — U071 COVID-19: Secondary | ICD-10-CM

## 2023-09-18 MED ORDER — NIRMATRELVIR/RITONAVIR (PAXLOVID)TABLET: 3.0000 | ORAL_TABLET | Freq: Two times a day (BID) | ORAL | 0 refills | Status: AC

## 2023-09-18 NOTE — Patient Instructions (Signed)

## 2023-09-18 NOTE — Progress Notes (Signed)
Virtual Visit Consent   [name], you are scheduled for a virtual visit with a [facility] provider today. Just as with appointments in the office, your consent must be obtained to participate. Your consent will be active for this visit and any virtual visit you may have with one of our providers in the next 365 days. If you have a MyChart account, a copy of this consent can be sent to you electronically.  As this is a virtual visit, video technology does not allow for your provider to perform a traditional examination. This may limit your provider's ability to fully assess your condition. If your provider identifies any concerns that need to be evaluated in person or the need to arrange testing (such as labs, EKG, etc.), we will make arrangements to do so. Although advances in technology are sophisticated, we cannot ensure that it will always work on either your end or our end. If the connection with a video visit is poor, the visit may have to be switched to a telephone visit. With either a video or telephone visit, we are not always able to ensure that we have a secure connection.  By engaging in this virtual visit, you consent to the provision of healthcare and authorize for your insurance to be billed (if applicable) for the services provided during this visit. Depending on your insurance coverage, you may receive a charge related to this service.  I need to obtain your verbal consent now. Are you willing to proceed with your visit today? Francisco [name] has provided verbal consent on 09/18/2023 for a virtual visit (video or telephone). Georgana Curio, FNP  Date: 09/18/2023 7:11 PM  Virtual Visit via Video Note   I, Georgana Curio, connected with  Francisco [name]  ([id number], 11-09-1956) on 09/18/23 at  7:30 PM EDT by a video-enabled telemedicine application and verified that I am speaking with the correct person using two identifiers.  Location: Patient: Virtual Visit Location  Patient: Home Provider: Virtual Visit Location Provider: Home Office   I discussed the limitations of evaluation and management by telemedicine and the availability of in person appointments. The patient expressed understanding and agreed to proceed.    History of Present Illness: Francisco [name] is a 67 y.o. who identifies as a male who was assigned male at birth, and is being seen today for positive covid testing today with sx starting Tuesday. He reports cough, body aches and fatigue. No fever. No resp distress. [name]  HPI: HPI  Problems:  Patient Active Problem List   Diagnosis Date Noted   Idiopathic pulmonary fibrosis (HCC) 02/16/2022   Positive ANA (antinuclear antibody) 02/16/2022   Rheumatoid factor positive 02/16/2022   Thrombocytopenia (HCC) 01/21/2022   Umbilical hernia without obstruction and without gangrene 05/14/2021   Bleeding esophageal varices (HCC)    Portal hypertension (HCC)    History of alcoholism (HCC)    History of hepatitis C    Repeated falls 08/31/2016   Hepatic cirrhosis (HCC) 01/31/2015   Portal hypertensive gastropathy (HCC) 07/19/2014   Hepatic cirrhosis due to chronic hepatitis C infection (HCC) and some component of alcohol use 07/19/2014   Personal history of colonic polyps 07/19/2014   Iron deficiency anemia due to chronic blood loss 07/19/2014    Allergies:  Allergies  Allergen Reactions   Other Shortness Of Breath    Israel Pig - Upper Respiratory    Medications:  Current Outpatient Medications:    carvedilol (COREG) 3.125 MG tablet, TAKE 1  TABLET BY MOUTH TWICE A DAY WITH A MEAL, Disp: 180 tablet, Rfl: 3   Fluticasone-Umeclidin-Vilant (TRELEGY ELLIPTA) 200-62.5-25 MCG/ACT AEPB, Inhale 1 puff into the lungs daily., Disp: 60 each, Rfl: 5   levETIRAcetam (KEPPRA) 500 MG tablet, Take 1 tablet (500 mg total) by mouth 2 (two) times daily., Disp: 180 tablet, Rfl: 3   Multiple Vitamins-Minerals (MULTIVITAMIN WITH MINERALS) tablet, Take 1 tablet by  mouth daily., Disp: , Rfl:    Pirfenidone (ESBRIET) 801 MG TABS, Take 801 mg by mouth 3 (three) times daily., Disp: , Rfl:   Observations/Objective: Patient is well-developed, well-nourished in no acute distress.  Resting comfortably  at home.  Head is normocephalic, atraumatic.  No labored breathing.  Speech is clear and coherent with logical content.  Patient is alert and oriented at baseline.    Assessment and Plan: 1. COVID  Increase fluids, humidifier at night, tylenol at night, UC if sx persist or worsen.   Follow Up Instructions: I discussed the assessment and treatment plan with the patient. The patient was provided an opportunity to ask questions and all were answered. The patient agreed with the plan and demonstrated an understanding of the instructions.  A copy of instructions were sent to the patient via MyChart unless otherwise noted below.     The patient was advised to call back or seek an in-person evaluation if the symptoms worsen or if the condition fails to improve as anticipated.  Time:  I spent 10 minutes with the patient via telehealth technology discussing the above problems/concerns.    Georgana Curio, FNP

## 2023-09-23 ENCOUNTER — Ambulatory Visit
Admission: RE | Admit: 2023-09-23 | Discharge: 2023-09-23 | Disposition: A | Discharge status: Home / Self Care | Payer: Medicare Other | Source: Ambulatory Visit | Attending: Orthopedic Surgery | Admitting: Orthopedic Surgery

## 2023-09-23 DIAGNOSIS — F17210 Nicotine dependence, cigarettes, uncomplicated: Secondary | ICD-10-CM

## 2023-09-23 DIAGNOSIS — Z87891 Personal history of nicotine dependence: Secondary | ICD-10-CM | POA: Diagnosis not present

## 2023-09-23 DIAGNOSIS — Z122 Encounter for screening for malignant neoplasm of respiratory organs: Secondary | ICD-10-CM

## 2023-09-27 ENCOUNTER — Ambulatory Visit (INDEPENDENT_AMBULATORY_CARE_PROVIDER_SITE_OTHER): Payer: Medicare Other | Admitting: Physician Assistant

## 2023-09-27 ENCOUNTER — Other Ambulatory Visit (INDEPENDENT_AMBULATORY_CARE_PROVIDER_SITE_OTHER): Payer: Medicare Other

## 2023-09-27 DIAGNOSIS — M25561 Pain in right knee: Secondary | ICD-10-CM | POA: Diagnosis not present

## 2023-09-27 DIAGNOSIS — M542 Cervicalgia: Secondary | ICD-10-CM | POA: Diagnosis not present

## 2023-09-27 MED ORDER — LIDOCAINE HCL 1 % IJ SOLN
3.0000 mL | INTRAMUSCULAR | Status: AC | PRN
  Administered 2023-09-27: 3 mL

## 2023-09-27 MED ORDER — METHYLPREDNISOLONE ACETATE 40 MG/ML IJ SUSP
40.0000 mg | INTRAMUSCULAR | Status: AC | PRN
  Administered 2023-09-27: 40 mg via INTRA_ARTICULAR

## 2023-09-27 NOTE — Progress Notes (Signed)
Office Visit Note   Patient: Francisco [name]           Date of Birth: [birth year]           MRN: [id number] Visit Date: 09/27/2023              Requested by: Octavia Heir, NP [phone] [address]. [address] [place],  [place] [address] PCP: Octavia Heir, NP   Assessment & Plan: Visit Diagnoses:  1. Right knee pain, unspecified chronicity   2. Neck pain     Plan:  Given the fact he has no radicular symptoms down either arm recommend physical therapy for his neck they will work on range of motion include modalities and home exercise program.  Regards to his right knee we will have therapy also work on quad strengthening.  They will include a home exercise program and modalities for the knee.  Discussed knee friendly exercises.  Have him follow-up with Dr. Magnus Ivan in 6 weeks to see how he is doing overall.  We did provide him with right knee aspiration and cortisone injection which she tolerated well today.  Follow-Up Instructions: Return in about 6 weeks (around 11/08/2023), or Dr. Magnus Ivan.   Orders:  Orders Placed This Encounter  Procedures   Large Joint Inj: R knee   XR KNEE 3 VIEW RIGHT   XR Cervical Spine 2 or 3 views   No orders of the defined types were placed in this encounter.     Procedures: Large Joint Inj: R knee on 09/27/2023 5:15 PM Indications: pain Details: 22 G 1.5 in needle, superolateral approach  Arthrogram: No  Medications: 3 mL lidocaine 1 %; 40 mg methylPREDNISolone acetate 40 MG/ML Aspirate: 5 mL yellow Outcome: tolerated well, no immediate complications Procedure, treatment alternatives, risks and benefits explained, specific risks discussed. Consent was given by the patient. Immediately prior to procedure a time out was called to verify the correct patient, procedure, equipment, support staff and site/side marked as required. Patient was prepped and draped in the usual sterile fashion.       Clinical Data: No additional findings.   Subjective: Chief  Complaint  Patient presents with   Neck - Pain   Right Knee - Pain    HPI Patient is a 67 year old male were seen today for the first time.  He comes in today with right knee pain and neck pain.  States that the knee pain has been ongoing for the past 4 to 5 months.  He does report that he fell about 4 to 5 months ago onto the patella directly.  States pain has somewhat improved.  He does wear a knee sleeve and this helps.  Rates his knee pain to be 3-4 out of 10 pain.  Notes a locking sensation and swelling in the knee at times.  States Voltaren gel helps and a copper fit knee sleeve helps.  Neck pain has been ongoing for about 1-1/2 years.  Mostly on the right side down to the shoulder.  No radicular symptoms down the arm.  He notes decreased range of motion when driving in regards to his neck.  Ranks pain to be 9 out of 10 pain at worst.  Describes pain as constant can be as low as 3 out of 10 but feels most the time is around 6 out of 10 pain.  He does take ibuprofen aspirin Tylenol.  He states that he watches cannot take too many NSAIDs due to history of low platelet count.  Has a history of epilepsy also history of hepatitis C.  Patient saw Dr. Dimple Casey in rheumatology in 2023 due to idiopathic pulmonary fibrosis and was found to have a positive ANA.  It was felt at that time that rheumatoid factor could persist in cases with previous chronic hepatitis C infection.  There is no further need at that point in time for rheumatologic workup.  Review of Systems  Constitutional:  Negative for chills and fever.  Musculoskeletal:  Positive for neck pain.     Objective: Vital Signs: There were no vitals taken for this visit.  Physical Exam Constitutional:      Appearance: He is normal weight. He is not ill-appearing or diaphoretic.  Cardiovascular:     Pulses: Normal pulses.  Pulmonary:     Effort: Pulmonary effort is normal.  Neurological:     Mental Status: He is alert and oriented to person,  place, and time.  Psychiatric:        Mood and Affect: Mood normal.     Ortho Exam Cervical spine: He has limited rotation to the left and right.  Discomfort with flexion extension cervical spine.  Positive Spurling's.  No periscapular pain bilaterally. Upper extremities: He has 5 out of 5 strength throughout the upper extremities against resistance.  Rotator cuff is without weakness bilaterally.  Sensation grossly intact bilateral hands.  Radial pulses are 2+ and equal symmetric bilaterally.  Full motor bilateral hands.  Bilateral knees: Good range of motion of both knees.  Right knee with significant patellofemoral crepitus with passive range of motion no instability valgus varus stressing of either knee.  Slight effusion right knee no abnormal warmth erythema of either knee.  McMurray's is negative bilaterally. Specialty Comments:  No specialty comments available.  Imaging: XR KNEE 3 VIEW RIGHT  Result Date: 09/27/2023 Right knee 2 views: Narrowing medial lateral joint line consistent with moderate arthritic changes.  Osteophytes off the lateral margin of the lateral femoral condyle.  Mild to moderate patellofemoral changes.  No acute fractures or acute findings.  Knee is well located.  Chondrocalcinosis noted.  XR Cervical Spine 2 or 3 views  Result Date: 09/27/2023 Cervical spine 2 views: Loss of lordotic curvature.  Degenerative changes with loss of disc space at C3-C4 and C5-6 C7.  Anterior osteophytes C3 3 C7.  No acute fractures no spondylolisthesis.  No acute findings.    PMFS History: Patient Active Problem List   Diagnosis Date Noted   Idiopathic pulmonary fibrosis (HCC) 02/16/2022   Positive ANA (antinuclear antibody) 02/16/2022   Rheumatoid factor positive 02/16/2022   Thrombocytopenia (HCC) 01/21/2022   Umbilical hernia without obstruction and without gangrene 05/14/2021   Bleeding esophageal varices (HCC)    Portal hypertension (HCC)    History of alcoholism (HCC)     History of hepatitis C    Repeated falls 08/31/2016   Hepatic cirrhosis (HCC) 01/31/2015   Portal hypertensive gastropathy (HCC) 07/19/2014   Hepatic cirrhosis due to chronic hepatitis C infection (HCC) and some component of alcohol use 07/19/2014   History of colonic polyps 07/19/2014   Iron deficiency anemia due to chronic blood loss 07/19/2014   Past Medical History:  Diagnosis Date   Allergy to [place] pig dander    Arthritis    "hands and knees the most; mild" (03/15/2017)   Benign neoplasm of colon 07/19/2014   07/19/14 - 2 adenomas - repeat colonoscopy 2020    Cirrhosis (HCC)    Diverticulosis 2008   Epilepsy (HCC)  Esophageal varices with bleeding (HCC) 07/19/2014   Falls related to headaches    Fatty liver 2011   Abd [name]   Headaches thought due to old head injury    Hepatic cirrhosis due to chronic hepatitis C infection (HCC) and some component of alcohol use 07/19/2014   Hepatitis C    History of blood transfusion 1987   "related to the zygomatic arch fracture"   Iron deficiency anemia secondary to blood loss (chronic) 07/19/2014   Portal hypertensive gastropathy (HCC) 07/19/2014   Scoliosis    Seizures (HCC)    Torn rotator cuff     Family History  Problem Relation Age of Onset   Breast cancer Mother 49   Crohn's disease Father 51   Arthritis/Rheumatoid Father    Hearing loss Father    Epilepsy Sister 87   Ankylosing spondylitis Brother 74   Heart disease Maternal Grandmother    Alcohol abuse Maternal Grandfather    Cancer Paternal Grandfather        type unknown, mets   Alcohol abuse Maternal Uncle        several   Depression Daughter 54    Past Surgical History:  Procedure Laterality Date   ABDOMINAL SURGERY     Knife puncture to Stomach   COLONOSCOPY  2008   COLONOSCOPY N/A 07/19/2014   Procedure: COLONOSCOPY;  Surgeon: Iva Boop, MD;  Location: [facility] ENDOSCOPY;  Service: Endoscopy;  Laterality: N/A;   COLONOSCOPY WITH PROPOFOL N/A 06/19/2021    Procedure: COLONOSCOPY WITH PROPOFOL;  Surgeon: Iva Boop, MD;  Location: [facility] ENDOSCOPY;  Service: Endoscopy;  Laterality: N/A;   ESOPHAGEAL BANDING  04/14/2017   Procedure: ESOPHAGEAL BANDING;  Surgeon: Iva Boop, MD;  Location: [facility] ENDOSCOPY;  Service: Endoscopy;;   ESOPHAGOGASTRODUODENOSCOPY N/A 07/19/2014   Procedure: ESOPHAGOGASTRODUODENOSCOPY (EGD);  Surgeon: Iva Boop, MD;  Location: [facility] ENDOSCOPY;  Service: Endoscopy;  Laterality: N/A;   ESOPHAGOGASTRODUODENOSCOPY N/A 09/05/2014   Procedure: ESOPHAGOGASTRODUODENOSCOPY (EGD);  Surgeon: Iva Boop, MD;  Location: [facility] ENDOSCOPY;  Service: Endoscopy;  Laterality: N/A;   ESOPHAGOGASTRODUODENOSCOPY N/A 10/17/2014   Procedure: ESOPHAGOGASTRODUODENOSCOPY (EGD);  Surgeon: Iva Boop, MD;  Location: [facility] ENDOSCOPY;  Service: Endoscopy;  Laterality: N/A;   ESOPHAGOGASTRODUODENOSCOPY N/A 12/19/2014   Procedure: ESOPHAGOGASTRODUODENOSCOPY (EGD);  Surgeon: Iva Boop, MD;  Location: [facility] ENDOSCOPY;  Service: Endoscopy;  Laterality: N/A;   ESOPHAGOGASTRODUODENOSCOPY N/A 03/16/2017   Procedure: ESOPHAGOGASTRODUODENOSCOPY (EGD);  Surgeon: Meryl Dare, MD;  Location: [facility] ENDOSCOPY;  Service: Endoscopy;  Laterality: N/A;   ESOPHAGOGASTRODUODENOSCOPY (EGD) WITH PROPOFOL N/A 11/21/2015   Procedure: ESOPHAGOGASTRODUODENOSCOPY (EGD) WITH PROPOFOL;  Surgeon: Iva Boop, MD;  Location: [facility] ENDOSCOPY;  Service: Endoscopy;  Laterality: N/A;   ESOPHAGOGASTRODUODENOSCOPY (EGD) WITH PROPOFOL N/A 04/14/2017   Procedure: ESOPHAGOGASTRODUODENOSCOPY (EGD) WITH PROPOFOL;  Surgeon: Iva Boop, MD;  Location: [facility] ENDOSCOPY;  Service: Endoscopy;  Laterality: N/A;   ESOPHAGOGASTRODUODENOSCOPY (EGD) WITH PROPOFOL N/A 07/23/2017   Procedure: ESOPHAGOGASTRODUODENOSCOPY (EGD) WITH PROPOFOL;  Surgeon: Iva Boop, MD;  Location: [facility] ENDOSCOPY;  Service: Endoscopy;  Laterality: N/A;   ESOPHAGOGASTRODUODENOSCOPY (EGD) WITH PROPOFOL N/A 06/19/2021   Procedure:  ESOPHAGOGASTRODUODENOSCOPY (EGD) WITH PROPOFOL;  Surgeon: Iva Boop, MD;  Location: [facility] ENDOSCOPY;  Service: Endoscopy;  Laterality: N/A;   ESOPHAGOGASTRODUODENOSCOPY (EGD) WITH PROPOFOL N/A 10/22/2022   Procedure: ESOPHAGOGASTRODUODENOSCOPY (EGD) WITH PROPOFOL;  Surgeon: Iva Boop, MD;  Location: [facility] ENDOSCOPY;  Service: Gastroenterology;  Laterality: N/A;   FRACTURE SURGERY Right    cheek   GASTRIC  VARICES BANDING N/A 10/17/2014   Procedure: GASTRIC VARICES BANDING;  Surgeon: Iva Boop, MD;  Location: [facility] ENDOSCOPY;  Service: Endoscopy;  Laterality: N/A;   ORIF ZYGOMATIC FRACTURE Right 1987   RADIUS SYNOSTOSIS PROXIMAL RESECTION W/ ALLOGRAFT Right 1980s   at the elbow   THYROIDECTOMY, PARTIAL Left 2012   Social History   Occupational History   Occupation: Publishing copy- retired    Associate Professor: [employer]    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  Tobacco Use   Smoking status: Every Day    Current packs/day: 0.00    Average packs/day: 0.5 packs/day for 47.8 years (23.9 ttl pk-yrs)    Types: Cigarettes    Start date: 05/08/1974    Last attempt to quit: 03/04/2022    Years since quitting: 1.5   Smokeless tobacco: Never   Tobacco comments:    Smokes about 4 cigarettes per day//pap 08/11/23  Vaping Use   Vaping status: Never Used  Substance and Sexual Activity   Alcohol use: No    Comment: 03/15/2017 "used to drink considerably; quit 08/27/14"   Drug use: No   Sexual activity: Not Currently

## 2023-09-29 ENCOUNTER — Other Ambulatory Visit: Payer: Self-pay | Admitting: Radiology

## 2023-09-29 DIAGNOSIS — M25561 Pain in right knee: Secondary | ICD-10-CM

## 2023-09-29 DIAGNOSIS — M542 Cervicalgia: Secondary | ICD-10-CM

## 2023-10-11 ENCOUNTER — Telehealth: Payer: Self-pay

## 2023-10-11 ENCOUNTER — Encounter: Payer: Self-pay | Admitting: Physical Therapy

## 2023-10-11 ENCOUNTER — Other Ambulatory Visit: Payer: Self-pay

## 2023-10-11 ENCOUNTER — Ambulatory Visit: Payer: Medicare Other | Admitting: Physical Therapy

## 2023-10-11 DIAGNOSIS — M6281 Muscle weakness (generalized): Secondary | ICD-10-CM | POA: Diagnosis not present

## 2023-10-11 DIAGNOSIS — M25561 Pain in right knee: Secondary | ICD-10-CM | POA: Diagnosis not present

## 2023-10-11 DIAGNOSIS — M542 Cervicalgia: Secondary | ICD-10-CM | POA: Diagnosis not present

## 2023-10-11 DIAGNOSIS — G8929 Other chronic pain: Secondary | ICD-10-CM

## 2023-10-11 NOTE — Telephone Encounter (Signed)
Cheryl from [facility] called with CT Chest Lung Cancer Screening results and states that patient has new 5.16mm right lower lobe nodule and they recommend 6 month follow up. She states that full report is on the way in Epic. Message routed to Abbey Chatters, NP due to PCP Octavia Heir, NP being out of office.

## 2023-10-11 NOTE — Telephone Encounter (Signed)
Yes in inbasket, will make sure to leave for Amy to review.
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
## 2023-10-11 NOTE — Telephone Encounter (Signed)
Noted  

## 2023-10-12 ENCOUNTER — Other Ambulatory Visit: Payer: Self-pay

## 2023-10-12 DIAGNOSIS — D696 Thrombocytopenia, unspecified: Secondary | ICD-10-CM

## 2023-10-13 ENCOUNTER — Inpatient Hospital Stay (HOSPITAL_BASED_OUTPATIENT_CLINIC_OR_DEPARTMENT_OTHER): Payer: Medicare Other | Admitting: Hematology

## 2023-10-13 ENCOUNTER — Inpatient Hospital Stay: Payer: Medicare Other | Attending: Hematology

## 2023-10-13 VITALS — BP 132/71 | HR 68 | Temp 98.0°F | Resp 18 | Wt 194.2 lb

## 2023-10-13 DIAGNOSIS — Z860101 Personal history of adenomatous and serrated colon polyps: Secondary | ICD-10-CM | POA: Insufficient documentation

## 2023-10-13 DIAGNOSIS — K7460 Unspecified cirrhosis of liver: Secondary | ICD-10-CM | POA: Diagnosis not present

## 2023-10-13 DIAGNOSIS — Z8261 Family history of arthritis: Secondary | ICD-10-CM | POA: Diagnosis not present

## 2023-10-13 DIAGNOSIS — D696 Thrombocytopenia, unspecified: Secondary | ICD-10-CM | POA: Diagnosis not present

## 2023-10-13 DIAGNOSIS — F17210 Nicotine dependence, cigarettes, uncomplicated: Secondary | ICD-10-CM | POA: Diagnosis not present

## 2023-10-13 DIAGNOSIS — D6959 Other secondary thrombocytopenia: Secondary | ICD-10-CM | POA: Insufficient documentation

## 2023-10-13 DIAGNOSIS — Z79899 Other long term (current) drug therapy: Secondary | ICD-10-CM | POA: Insufficient documentation

## 2023-10-13 DIAGNOSIS — M419 Scoliosis, unspecified: Secondary | ICD-10-CM | POA: Diagnosis not present

## 2023-10-13 DIAGNOSIS — Z9089 Acquired absence of other organs: Secondary | ICD-10-CM | POA: Insufficient documentation

## 2023-10-13 DIAGNOSIS — M791 Myalgia, unspecified site: Secondary | ICD-10-CM | POA: Diagnosis not present

## 2023-10-13 DIAGNOSIS — Z8379 Family history of other diseases of the digestive system: Secondary | ICD-10-CM | POA: Diagnosis not present

## 2023-10-13 DIAGNOSIS — F1010 Alcohol abuse, uncomplicated: Secondary | ICD-10-CM | POA: Diagnosis not present

## 2023-10-13 DIAGNOSIS — Z803 Family history of malignant neoplasm of breast: Secondary | ICD-10-CM | POA: Insufficient documentation

## 2023-10-13 DIAGNOSIS — J84112 Idiopathic pulmonary fibrosis: Secondary | ICD-10-CM | POA: Diagnosis not present

## 2023-10-13 DIAGNOSIS — G40409 Other generalized epilepsy and epileptic syndromes, not intractable, without status epilepticus: Secondary | ICD-10-CM | POA: Insufficient documentation

## 2023-10-13 DIAGNOSIS — D731 Hypersplenism: Secondary | ICD-10-CM | POA: Insufficient documentation

## 2023-10-13 DIAGNOSIS — Z8249 Family history of ischemic heart disease and other diseases of the circulatory system: Secondary | ICD-10-CM | POA: Insufficient documentation

## 2023-10-13 DIAGNOSIS — K760 Fatty (change of) liver, not elsewhere classified: Secondary | ICD-10-CM | POA: Insufficient documentation

## 2023-10-13 LAB — CMP (CANCER CENTER ONLY)
ALT: 18 U/L (ref 0–44)
AST: 25 U/L (ref 15–41)
Albumin: 4 g/dL (ref 3.5–5.0)
Alkaline Phosphatase: 88 U/L (ref 38–126)
Anion gap: 4 — ABNORMAL LOW (ref 5–15)
BUN: 12 mg/dL (ref 8–23)
CO2: 25 mmol/L (ref 22–32)
Calcium: 10.3 mg/dL (ref 8.9–10.3)
Chloride: 108 mmol/L (ref 98–111)
Creatinine: 0.68 mg/dL (ref 0.61–1.24)
GFR, Estimated: >60 mL/min (ref >60)
Glucose, Bld: 94 mg/dL (ref 70–99)
Potassium: 3.8 mmol/L (ref 3.5–5.1)
Sodium: 137 mmol/L (ref 135–145)
Total Bilirubin: 1.3 mg/dL — ABNORMAL HIGH (ref 0.3–1.2)
Total Protein: 7.5 g/dL (ref 6.5–8.1)

## 2023-10-13 LAB — CBC WITH DIFFERENTIAL (CANCER CENTER ONLY)
Abs Immature Granulocytes: 0.01 10*3/uL (ref 0.00–0.07)
Basophils Absolute: 0 10*3/uL (ref 0.0–0.1)
Basophils Relative: 1 %
Eosinophils Absolute: 0.1 10*3/uL (ref 0.0–0.5)
Eosinophils Relative: 3 %
HCT: 38.6 % — ABNORMAL LOW (ref 39.0–52.0)
Hemoglobin: 14 g/dL (ref 13.0–17.0)
Immature Granulocytes: 0 %
Lymphocytes Relative: 21 %
Lymphs Abs: 0.8 10*3/uL (ref 0.7–4.0)
MCH: 32 pg (ref 26.0–34.0)
MCHC: 36.3 g/dL — ABNORMAL HIGH (ref 30.0–36.0)
MCV: 88.1 fL (ref 80.0–100.0)
Monocytes Absolute: 0.2 10*3/uL (ref 0.1–1.0)
Monocytes Relative: 5 %
Neutro Abs: 2.7 10*3/uL (ref 1.7–7.7)
Neutrophils Relative %: 70 %
Platelet Count: 55 10*3/uL — ABNORMAL LOW (ref 150–400)
RBC: 4.38 MIL/uL (ref 4.22–5.81)
RDW: 14.6 % (ref 11.5–15.5)
WBC Count: 3.9 10*3/uL — ABNORMAL LOW (ref 4.0–10.5)
nRBC: 0 % (ref 0.0–0.2)

## 2023-10-13 LAB — PROTIME-INR
INR: 1.3 — ABNORMAL HIGH (ref 0.8–1.2)
Prothrombin Time: 16.1 s — ABNORMAL HIGH (ref 11.4–15.2)

## 2023-10-13 LAB — IMMATURE PLATELET FRACTION: Immature Platelet Fraction: 1.9 % (ref 1.2–8.6)

## 2023-10-13 NOTE — Progress Notes (Signed)
HEMATOLOGY/ONCOLOGY CLINIC VISIT NOTE  Date of Service: 10/13/23   Patient Care Team: Octavia Heir, NP as PCP - General (Adult Health Nurse Practitioner) Drema Dallas, DO as Consulting Physician (Neurology)  CHIEF COMPLAINTS/PURPOSE OF CONSULTATION:  Follow-up for thrombocytopenia  HISTORY OF PRESENTING ILLNESS:   Francisco [name] is a wonderful 67 y.o. male who has been referred to [name] by Hazle Nordmann, NP for evaluation and management of thrombocytopenia. The pt reports that he is doing well overall.  The pt reports that he has had low Plt counts since 2018. The pt notes that he sees Dr. Leone Payor and has done four bandings and nine endoscopies total. The pt has a history of liver cirrhosis and denies any known history of clotting disorder. The pt notes that the Hepatitis C he had was due to a blood transfusion in the 1980s and was undetectable within 5 weeks of the 12 week treatment. The pt notes he quit alcohol in 2015 and they discovered the Hepatitis and cirrhosis in 2016. The pt notes that they believe the alcohol exacerbated the cirrhosis and played a role in this. The pt notes that he was getting ultrasounds of the liver, but has not gotten one recently due to the pandemic. The pt notes that he wishes to get another one sometime soon to observe the changes. The pt notes that he has been on Keppra since Fall 2018. The pt notes that his mother and sister also have epilepsy. The pt notes he takes Advil once a month and Tylenol 3-4x a month, but denies needing them on a regular basis. The pt notes his diet and food intake has been very stable.  The pt notes no recent issues and was referred here due to a routine checkup. The pt note he did not have a PCP prior and this was a part of that visit. The pt notes that he lost 25 pounds within a span of 6 months after he retired. The pt notes that he believes he has been eating better, less stress, and more active.  Lab results 03/27/2021 of CBC  w/diff and CMP is as follows: all values are WNL except for WBC of 3.2K, Plt of 55K.  On review of systems, pt reports intermittent neck muscle pain and denies bleeding issues, decreased appetite, imbalanced diet, bloody stools, sudden weight changes, infection issues, cough, antibiotic usage, changes in bowel habits, and any other symptoms.  INTERVAL HISTORY  Francisco [name] is here for 1-month follow-up of his thrombocytopenia. Patient was last seen by me on 10/02/2022 and he was doing well overall with no new medical concerns.   Today, he reports that he has been doing well overall over the last year. He reports having senile purpura a couple years ago.   Patient reports that he has lost 20 pounds in the last 6 months, attributed to increased activity related to home remodeling as his daughter is moving into his home. Patient's current weight is 194 pounds. He generally walks a couple miles a day.   He reports that things have been stable from a liver standpoint and he continues to follow with Dr. Leone Payor. Patient was recently started on Carvedilol which he has tolerated well. He has not been started on any new medications since being started on Carvedilol.   Patient has no abnormal bleeding or bruising issues. He denies any bleeding issues such as GI bleeding, black stools, blood in stools.   He denies any infection issues, abdominal  pain, or leg swelling. He has been using compression socks regularly.   He continues to be on Keppra 50 MG for seizure disorder. He reports that he generally takes 200 MG Advil once a week as Tylenol is not effective. He regularly takes one tablet daily of Centrum for adults over 50, but does not take any other OTC supplement.   His underlying pulmonary fibrosis has been stable over the last year. Patient reports that he has been taking an Antifibrotic, but it has been difficult to obtain recently. He was previously on Pirfenidone. He has had two CT scans in  the last 4 months. His most recent CT scan on 09/23/2023 showed a small nodule in the right lower lung.    Patient reports that he has decreased his smoking habits. He currently smokes 2-3 cigarettes a day and previously smoked 10-12 cigarettes 1 year ago. He does not consume any alcohol at this time.   MEDICAL HISTORY:  Past Medical History:  Diagnosis Date   Allergy to [place] pig dander    Arthritis    "hands and knees the most; mild" (03/15/2017)   Benign neoplasm of colon 07/19/2014   07/19/14 - 2 adenomas - repeat colonoscopy 2020    Cirrhosis (HCC)    Diverticulosis 2008   Epilepsy (HCC)    Esophageal varices with bleeding (HCC) 07/19/2014   Falls related to headaches    Fatty liver 2011   Abd [name]   Headaches thought due to old head injury    Hepatic cirrhosis due to chronic hepatitis C infection (HCC) and some component of alcohol use 07/19/2014   Hepatitis C    History of blood transfusion 1987   "related to the zygomatic arch fracture"   Iron deficiency anemia secondary to blood loss (chronic) 07/19/2014   Portal hypertensive gastropathy (HCC) 07/19/2014   Scoliosis    Seizures (HCC)    Torn rotator cuff     SURGICAL HISTORY: Past Surgical History:  Procedure Laterality Date   ABDOMINAL SURGERY     Knife puncture to Stomach   COLONOSCOPY  2008   COLONOSCOPY N/A 07/19/2014   Procedure: COLONOSCOPY;  Surgeon: Iva Boop, MD;  Location: [facility] ENDOSCOPY;  Service: Endoscopy;  Laterality: N/A;   COLONOSCOPY WITH PROPOFOL N/A 06/19/2021   Procedure: COLONOSCOPY WITH PROPOFOL;  Surgeon: Iva Boop, MD;  Location: [facility] ENDOSCOPY;  Service: Endoscopy;  Laterality: N/A;   ESOPHAGEAL BANDING  04/14/2017   Procedure: ESOPHAGEAL BANDING;  Surgeon: Iva Boop, MD;  Location: [facility] ENDOSCOPY;  Service: Endoscopy;;   ESOPHAGOGASTRODUODENOSCOPY N/A 07/19/2014   Procedure: ESOPHAGOGASTRODUODENOSCOPY (EGD);  Surgeon: Iva Boop, MD;  Location: [facility] ENDOSCOPY;  Service: Endoscopy;   Laterality: N/A;   ESOPHAGOGASTRODUODENOSCOPY N/A 09/05/2014   Procedure: ESOPHAGOGASTRODUODENOSCOPY (EGD);  Surgeon: Iva Boop, MD;  Location: [facility] ENDOSCOPY;  Service: Endoscopy;  Laterality: N/A;   ESOPHAGOGASTRODUODENOSCOPY N/A 10/17/2014   Procedure: ESOPHAGOGASTRODUODENOSCOPY (EGD);  Surgeon: Iva Boop, MD;  Location: [facility] ENDOSCOPY;  Service: Endoscopy;  Laterality: N/A;   ESOPHAGOGASTRODUODENOSCOPY N/A 12/19/2014   Procedure: ESOPHAGOGASTRODUODENOSCOPY (EGD);  Surgeon: Iva Boop, MD;  Location: [facility] ENDOSCOPY;  Service: Endoscopy;  Laterality: N/A;   ESOPHAGOGASTRODUODENOSCOPY N/A 03/16/2017   Procedure: ESOPHAGOGASTRODUODENOSCOPY (EGD);  Surgeon: Meryl Dare, MD;  Location: [facility] ENDOSCOPY;  Service: Endoscopy;  Laterality: N/A;   ESOPHAGOGASTRODUODENOSCOPY (EGD) WITH PROPOFOL N/A 11/21/2015   Procedure: ESOPHAGOGASTRODUODENOSCOPY (EGD) WITH PROPOFOL;  Surgeon: Iva Boop, MD;  Location: [facility] ENDOSCOPY;  Service: Endoscopy;  Laterality: N/A;  ESOPHAGOGASTRODUODENOSCOPY (EGD) WITH PROPOFOL N/A 04/14/2017   Procedure: ESOPHAGOGASTRODUODENOSCOPY (EGD) WITH PROPOFOL;  Surgeon: Iva Boop, MD;  Location: [facility] ENDOSCOPY;  Service: Endoscopy;  Laterality: N/A;   ESOPHAGOGASTRODUODENOSCOPY (EGD) WITH PROPOFOL N/A 07/23/2017   Procedure: ESOPHAGOGASTRODUODENOSCOPY (EGD) WITH PROPOFOL;  Surgeon: Iva Boop, MD;  Location: [facility] ENDOSCOPY;  Service: Endoscopy;  Laterality: N/A;   ESOPHAGOGASTRODUODENOSCOPY (EGD) WITH PROPOFOL N/A 06/19/2021   Procedure: ESOPHAGOGASTRODUODENOSCOPY (EGD) WITH PROPOFOL;  Surgeon: Iva Boop, MD;  Location: [facility] ENDOSCOPY;  Service: Endoscopy;  Laterality: N/A;   ESOPHAGOGASTRODUODENOSCOPY (EGD) WITH PROPOFOL N/A 10/22/2022   Procedure: ESOPHAGOGASTRODUODENOSCOPY (EGD) WITH PROPOFOL;  Surgeon: Iva Boop, MD;  Location: [facility] ENDOSCOPY;  Service: Gastroenterology;  Laterality: N/A;   FRACTURE SURGERY Right    cheek   GASTRIC VARICES BANDING N/A  10/17/2014   Procedure: GASTRIC VARICES BANDING;  Surgeon: Iva Boop, MD;  Location: [facility] ENDOSCOPY;  Service: Endoscopy;  Laterality: N/A;   ORIF ZYGOMATIC FRACTURE Right 1987   RADIUS SYNOSTOSIS PROXIMAL RESECTION W/ ALLOGRAFT Right 1980s   at the elbow   THYROIDECTOMY, PARTIAL Left 2012    SOCIAL HISTORY: Social History   Socioeconomic History   Marital status: Divorced    Spouse name: Not on file   Number of children: 1   Years of education: some college   Highest education level: Some college, no degree  Occupational History   Occupation: Publishing copy- retired    Associate Professor: [employer]    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  Tobacco Use   Smoking status: Every Day    Current packs/day: 0.00    Average packs/day: 0.5 packs/day for 47.8 years (23.9 ttl pk-yrs)    Types: Cigarettes    Start date: 05/08/1974    Last attempt to quit: 03/04/2022    Years since quitting: 1.6   Smokeless tobacco: Never   Tobacco comments:    Smokes about 4 cigarettes per day//pap 08/11/23  Vaping Use   Vaping status: Never Used  Substance and Sexual Activity   Alcohol use: No    Comment: 03/15/2017 "used to drink considerably; quit 08/27/14"   Drug use: No   Sexual activity: Not Currently  Other Topics Concern   Not on file  Social History Narrative   Divorced, one daughter   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 Lives with his dog in a 1 story home Father and his dog moved 2020.    Right handed   One story home         Marital status:   Divorced                            What year were you married ? 1990      Highest Level of education completed: 2/1/2 Years college      Current or past profession: Art therapist       Do you exercise?   Yes                           Type & how often Walk every day  ( 1/2-2 miles) [employer] work      ADVANCED DIRECTIVES (Please  bring copies)      Do you have a living will? Yes      Do you have a  DNR form?   Yes                    If not, do you want to discuss one?       Do you have signed POA?HPOA forms? Yes                If so, please bring to your appointment      FUNCTIONAL STATUS- To be completed by Spouse / child / Staff       Do you have difficulty bathing or dressing yourself ?  No      Do you have difficulty preparing food or eating ?  No      Do you have difficulty managing your mediation ?  No      Do you have difficulty managing your finances ?  No      Do you have difficulty affording your medication ?  No      Social Determinants of Health   Financial Resource Strain: Low Risk  (04/01/2023)   Overall Financial Resource Strain (CARDIA)    Difficulty of Paying Living Expenses: Not hard at all  Food Insecurity: No Food Insecurity (04/01/2023)   Hunger Vital Sign    Worried About Running Out of Food in the Last Year: Never true    Ran Out of Food in the Last Year: Never true  Transportation Needs: No Transportation Needs (04/01/2023)   PRAPARE - Administrator, Civil Service (Medical): No    Lack of Transportation (Non-Medical): No  Physical Activity: Sufficiently Active (04/01/2023)   Exercise Vital Sign    Days of Exercise per Week: 7 days    Minutes of Exercise per Session: 60 min  Recent Concern: Physical Activity - Insufficiently Active (03/09/2023)   Exercise Vital Sign    Days of Exercise per Week: 2 days    Minutes of Exercise per Session: 40 min  Stress: Stress Concern Present (04/01/2023)   [employer] of Occupational Health - Occupational Stress Questionnaire    Feeling of Stress : To some extent  Social Connections: Unknown (04/01/2023)   Social Connection and Isolation Panel [NHANES]    Frequency of Communication with Friends and Family: More than three times a week    Frequency of Social Gatherings with Friends and Family: Twice a week    Attends Religious  Services: Patient declined    Database administrator or Organizations: No    Attends Banker Meetings: Never    Marital Status: Divorced  Catering manager Violence: Not At Risk (04/01/2023)   Humiliation, Afraid, Rape, and Kick questionnaire    Fear of Current or Ex-Partner: No    Emotionally Abused: No    Physically Abused: No    Sexually Abused: No    FAMILY HISTORY: Family History  Problem Relation Age of Onset   Breast cancer Mother 30   Crohn's disease Father 106   Arthritis/Rheumatoid Father    Hearing loss Father    Epilepsy Sister 59   Ankylosing spondylitis Brother 72   Heart disease Maternal Grandmother    Alcohol abuse Maternal Grandfather    Cancer Paternal Grandfather        type unknown, mets   Alcohol abuse Maternal Uncle        several   Depression Daughter 94    ALLERGIES:  is allergic to other.  MEDICATIONS:  Current Outpatient Medications  Medication Sig Dispense Refill   carvedilol (COREG) 3.125 MG tablet  TAKE 1 TABLET BY MOUTH TWICE A DAY WITH A MEAL 180 tablet 3   Fluticasone-Umeclidin-Vilant (TRELEGY ELLIPTA) 200-62.5-25 MCG/ACT AEPB Inhale 1 puff into the lungs daily. 60 each 5   levETIRAcetam (KEPPRA) 500 MG tablet Take 1 tablet (500 mg total) by mouth 2 (two) times daily. 180 tablet 3   Multiple Vitamins-Minerals (MULTIVITAMIN WITH MINERALS) tablet Take 1 tablet by mouth daily.     Pirfenidone (ESBRIET) 801 MG TABS Take 801 mg by mouth 3 (three) times daily.     No current facility-administered medications for this visit.    REVIEW OF SYSTEMS:    10 Point review of Systems was done is negative except as noted above.   PHYSICAL EXAMINATION: ECOG PERFORMANCE STATUS: 1 - Symptomatic but completely ambulatory  . Vitals:   10/13/23 1402  BP: 132/71  Pulse: 68  Resp: 18  Temp: 98 F (36.7 C)  SpO2: 99%    Filed Weights   10/13/23 1402  Weight: 194 lb 3.2 oz (88.1 kg)    .There is no height or weight on file to calculate  BMI.  GENERAL:alert, in no acute distress and comfortable SKIN: no acute rashes, no significant lesions EYES: conjunctiva are pink and non-injected, sclera anicteric OROPHARYNX: MMM, no exudates, no oropharyngeal erythema or ulceration NECK: supple, no JVD LYMPH:  no palpable lymphadenopathy in the cervical, axillary or inguinal regions LUNGS: clear to auscultation b/l with normal respiratory effort HEART: regular rate & rhythm ABDOMEN:  normoactive bowel sounds , non tender, not distended. Extremity: no pedal edema PSYCH: alert & oriented x 3 with fluent speech NEURO: no focal motor/sensory deficits   LABORATORY DATA:  I have reviewed the data as listed  .    Latest Ref Rng & Units 10/13/2023    1:41 PM 09/06/2023    9:12 AM 03/11/2023    1:41 PM  CBC  WBC 4.0 - 10.5 K/uL 3.9  3.6  4.3   Hemoglobin 13.0 - 17.0 g/dL 19.1  47.8  29.5   Hematocrit 39.0 - 52.0 % 38.6  38.2  39.4   Platelets 150 - 400 K/uL 55  69  72     .    Latest Ref Rng & Units 10/13/2023    1:41 PM 09/06/2023    9:12 AM 08/11/2023   11:55 AM  CMP  Glucose 70 - 99 mg/dL 94  88  96   BUN 8 - 23 mg/dL 12  11  13    Creatinine 0.61 - 1.24 mg/dL 6.21  3.08  6.57   Sodium 135 - 145 mmol/L 137  139  139   Potassium 3.5 - 5.1 mmol/L 3.8  4.0  4.0   Chloride 98 - 111 mmol/L 108  108  108   CO2 22 - 32 mmol/L 25  24  27    Calcium 8.9 - 10.3 mg/dL 84.6  9.5  96.2   Total Protein 6.5 - 8.1 g/dL 7.5  7.1  7.3   Total Bilirubin 0.3 - 1.2 mg/dL 1.3  0.6  0.9   Alkaline Phos 38 - 126 U/L 88   96   AST 15 - 41 U/L 25  23  27    ALT 0 - 44 U/L 18  17  21     Component     Latest Ref Rng & Units 04/01/2021  Folate, Hemolysate     Not Estab. ng/mL 362.0  HCT     37.5 - 51.0 % 42.6  Folate, RBC     >  498 ng/mL 850  Prothrombin Time     11.4 - 15.2 seconds 14.8  INR     0.8 - 1.2 1.2  Vitamin B12     180 - 914 pg/mL 221  Ferritin     24 - 336 ng/mL 129  Fibrinogen     210 - 475 mg/dL 161    RADIOGRAPHIC  STUDIES: I have personally reviewed the radiological images as listed and agreed with the findings in the report.  [name] abd 05/26/2021: IMPRESSION: Changes of cirrhosis of the liver without focal mass.   Cholelithiasis without complicating factors.   Changes consistent with splenomegaly.     Electronically Signed   By: Alcide Clever M.D.   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 Isaiah Serge #5 history of generalized epilepsy/drop attacks following with Dr. Everlena Cooper and is currently on Keppra  PLAN:  -Discussed lab results on 10/13/23 in detail with patient. CBC showed WBC of 3.9K, hemoglobin of 14.0, and platelets close to baseline at 55K. -platelets were previously in the high 60s one month ago -Hemoglobin and WBCs are stable -CMP stable -No indication to initiate treatment for his thrombocytopenia at this time. -Advised patient to avoid any NSAID medications due to bleeding risks. Educated patient that not all effects on platelets are dose dependent and there can be an idiosyncratic effect with Advil use. Discussed that there may be effects on platelet number and platelet function causing an increased risk of bleeding. -encouraged smoking cessation -discussed option of using nicotine patch/gum -will continue to monitor with labs in 1 year -continue to follow with Dr. Isaiah Serge and pulmonary for evaluation and management of idiopathic pulmonary fibrosis -continue to follow-up with his neurologist, Dr. Shon Millet, for his primary generalized epilepsy/drop attacks -Continue follow-up with gastroenterology for management of his liver cirrhosis and related complications.  He will continue to need [facility] screening every 6 months with his gastroenterologist.  FOLLOW  UP: RTC with Dr Candise Che with labs in 12 months  The total time spent in the appointment was 20 minutes* .  All of the patient's questions were answered with apparent satisfaction. The patient knows to call the clinic with any problems, questions or concerns.   Wyvonnia Lora MD MS AAHIVMS [facility] Hematology/Oncology Physician [facility]  .*Total Encounter Time as defined by the Centers for Medicare and Medicaid Services includes, in addition to the face-to-face time of a patient visit (documented in the note above) non-face-to-face time: obtaining and reviewing outside history, ordering and reviewing medications, tests or procedures, care coordination (communications with other health care professionals or caregivers) and documentation in the medical record.   I,Mitra Faeizi,acting as a Neurosurgeon for Wyvonnia Lora, MD.,have documented all relevant documentation on the behalf of Wyvonnia Lora, MD,as directed by  Wyvonnia Lora, MD while in the presence of Wyvonnia Lora, MD.  .I have reviewed the above documentation for accuracy and completeness, and I agree with the above. Johney Maine MD

## 2023-10-15 ENCOUNTER — Telehealth: Payer: Self-pay | Admitting: Hematology

## 2023-10-15 NOTE — Telephone Encounter (Signed)
Per Candise Che 10/13/2023 los patient is aware of scheduled appointment times/dates for follow up

## 2023-10-18 IMAGING — CR DG CERVICAL SPINE COMPLETE 4+V
5 series · 5 of 5 positions shown · non-contrast
Comparison: None.

CLINICAL DATA: Neck pain radiating to the right shoulder for
six-months.

EXAM:
CERVICAL SPINE - COMPLETE 5 VIEW

[w c-spine lat]
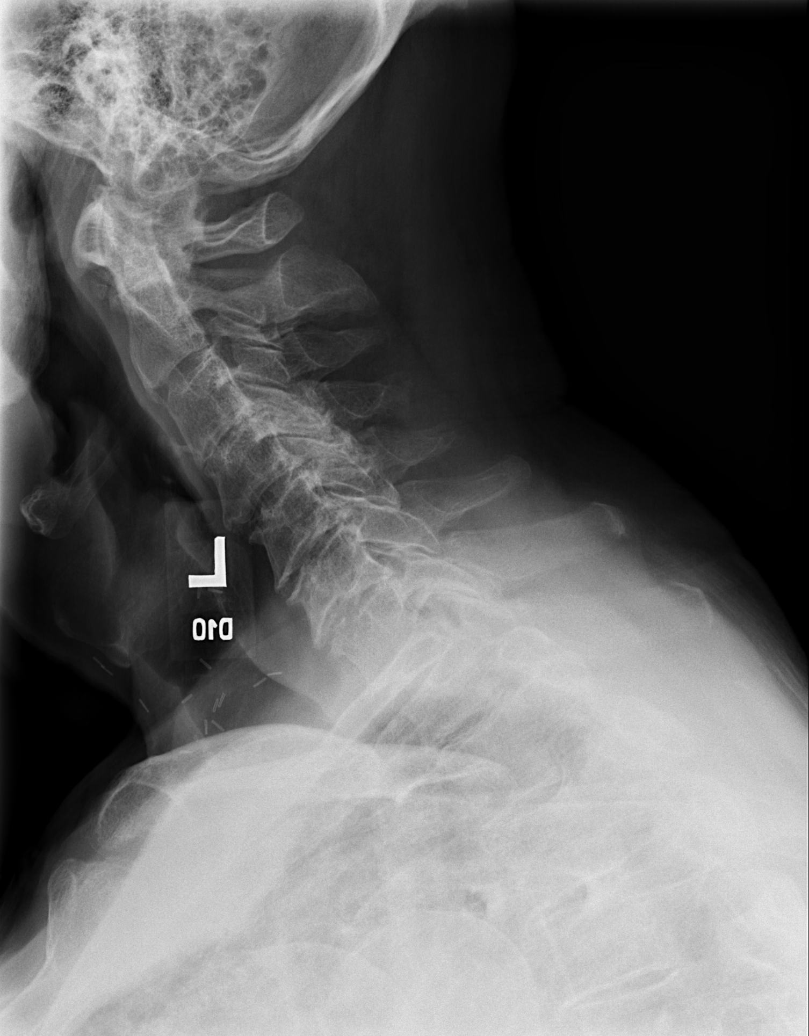

[w c-spine oblique (1 of 2)]
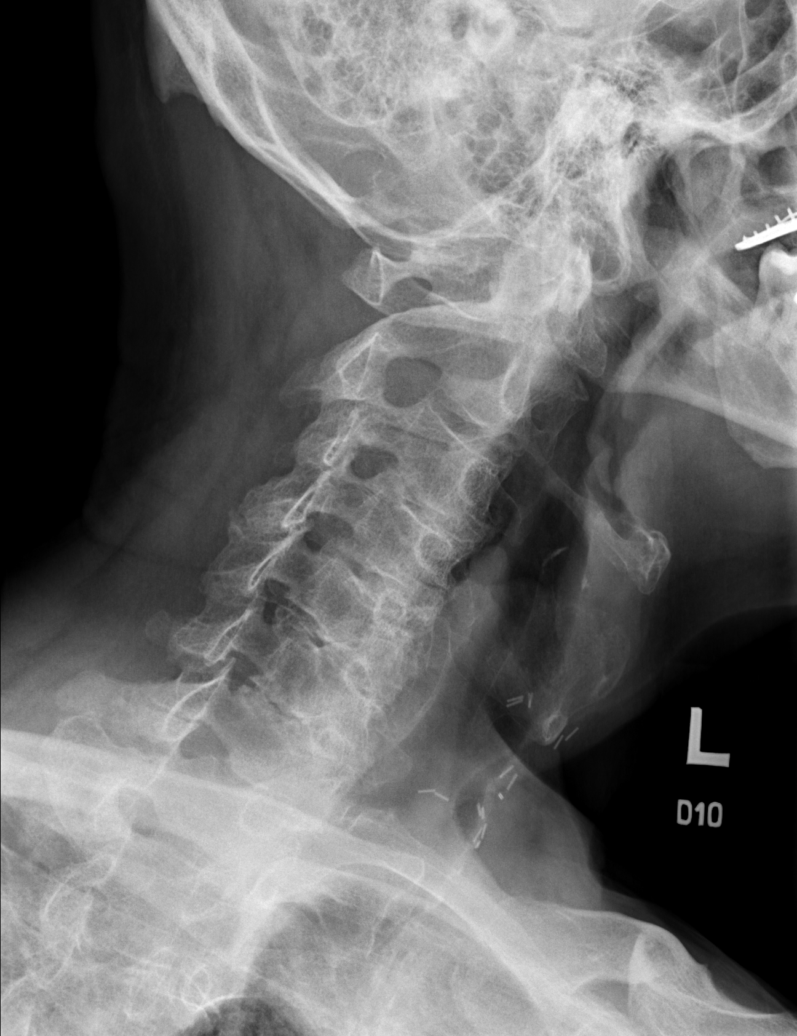

[w c-spine oblique (2 of 2)]
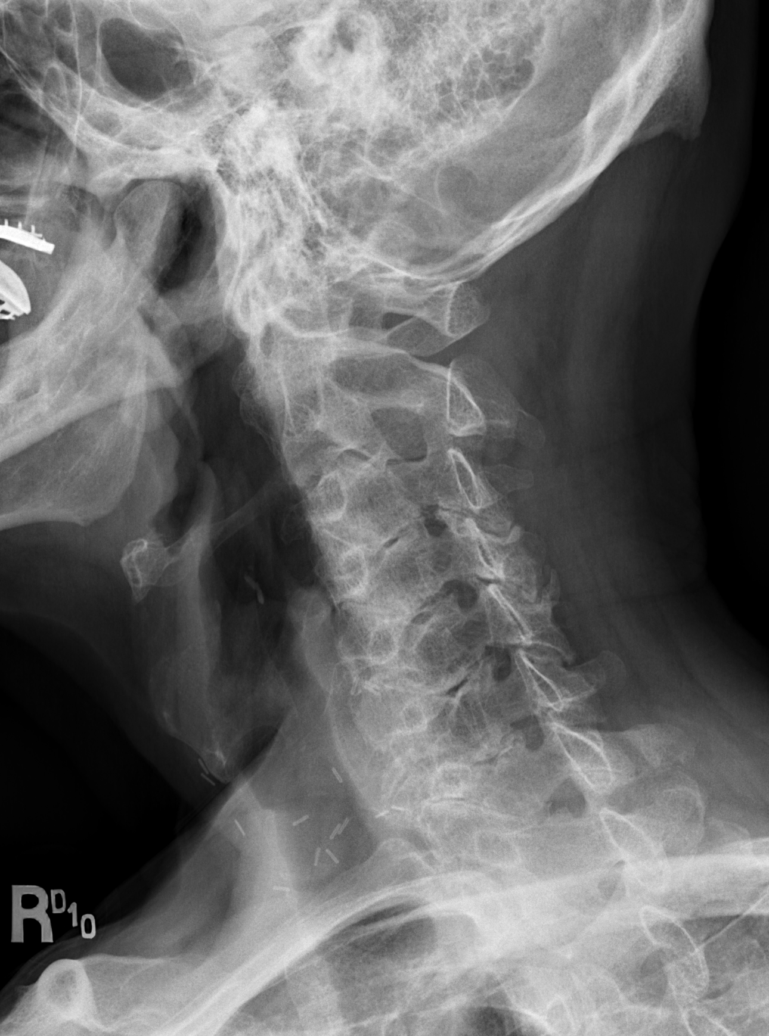

[w c-spine a.p. * (1 of 2)]
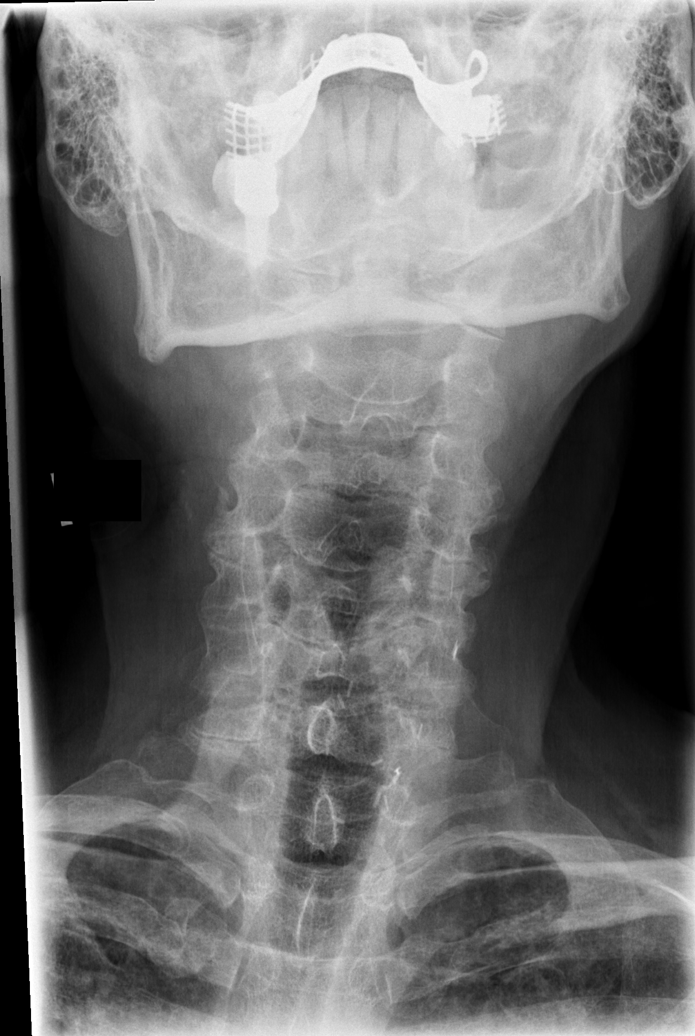

[w c-spine a.p. * (2 of 2)]
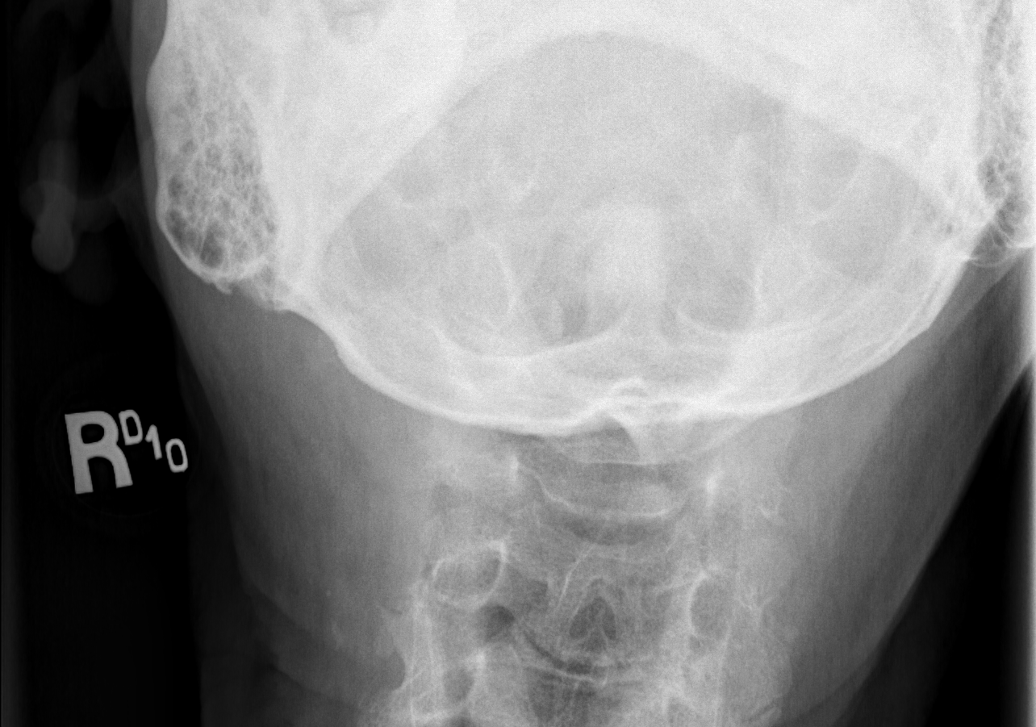

[5 of 5 positions shown; findings below may reference images not displayed]

FINDINGS: There is no evidence of cervical spine fracture or prevertebral soft
tissue swelling. Alignment is normal. Degenerative joint changes
with narrowed joint space and osteophyte formation are noted
throughout the spine more prominently involving the lower cervical
spine. There are narrowing of the bilateral C5-6 and C6-7 neural
foramina and left C3-4 neural foramina due to osteophyte
encroachment.
IMPRESSION: Degenerative joint changes of the cervical spine more prominently
involving C5-6 and C6-7 as described.

## 2023-10-21 ENCOUNTER — Ambulatory Visit: Payer: Medicare Other | Admitting: Internal Medicine

## 2023-10-21 ENCOUNTER — Ambulatory Visit: Payer: Medicare Other | Admitting: Physical Therapy

## 2023-10-21 ENCOUNTER — Other Ambulatory Visit: Payer: Medicare Other

## 2023-10-21 ENCOUNTER — Encounter: Payer: Self-pay | Admitting: Physical Therapy

## 2023-10-21 ENCOUNTER — Encounter: Payer: Self-pay | Admitting: Internal Medicine

## 2023-10-21 VITALS — BP 106/62 | HR 61 | Ht 71.0 in | Wt 196.4 lb

## 2023-10-21 DIAGNOSIS — R161 Splenomegaly, not elsewhere classified: Secondary | ICD-10-CM | POA: Diagnosis not present

## 2023-10-21 DIAGNOSIS — K7460 Unspecified cirrhosis of liver: Secondary | ICD-10-CM | POA: Diagnosis not present

## 2023-10-21 DIAGNOSIS — K766 Portal hypertension: Secondary | ICD-10-CM | POA: Diagnosis not present

## 2023-10-21 DIAGNOSIS — I8511 Secondary esophageal varices with bleeding: Secondary | ICD-10-CM

## 2023-10-21 DIAGNOSIS — M6281 Muscle weakness (generalized): Secondary | ICD-10-CM | POA: Diagnosis not present

## 2023-10-21 DIAGNOSIS — M25561 Pain in right knee: Secondary | ICD-10-CM | POA: Diagnosis not present

## 2023-10-21 DIAGNOSIS — B182 Chronic viral hepatitis C: Secondary | ICD-10-CM

## 2023-10-21 DIAGNOSIS — K3189 Other diseases of stomach and duodenum: Secondary | ICD-10-CM | POA: Diagnosis not present

## 2023-10-21 DIAGNOSIS — M542 Cervicalgia: Secondary | ICD-10-CM | POA: Diagnosis not present

## 2023-10-21 DIAGNOSIS — K429 Umbilical hernia without obstruction or gangrene: Secondary | ICD-10-CM

## 2023-10-21 DIAGNOSIS — G8929 Other chronic pain: Secondary | ICD-10-CM

## 2023-10-21 NOTE — Patient Instructions (Addendum)
_______________________________________________________  If your blood pressure at your visit was 140/90 or greater, please contact your primary care physician to follow up on this.  _______________________________________________________  If you are age 67 or older, your body mass index should be between 23-30. Your Body mass index is 27.39 kg/m. If this is out of the aforementioned range listed, please consider follow up with your Primary Care Provider.  If you are age 28 or younger, your body mass index should be between 19-25. Your Body mass index is 27.39 kg/m. If this is out of the aformentioned range listed, please consider follow up with your Primary Care Provider.   ________________________________________________________  The [facility] providers would like to encourage you to use [facility] to communicate with providers for non-urgent requests or questions.  Due to long hold times on the telephone, sending your provider a message by [facility] may be a faster and more efficient way to get a response.  Please allow 48 business hours for a response.  Please remember that this is for non-urgent requests.  _______________________________________________________  Your provider has requested that you go to the basement level for lab work before leaving today. Press "B" on the elevator. The lab is located at the first door on the left as you exit the elevator.  Follow up in 1 year by calling to make an appointment at [phone].  I appreciate the opportunity to care for you.

## 2023-10-21 NOTE — Therapy (Signed)
OUTPATIENT PHYSICAL THERAPY TREATMENT   Patient Name: Francisco [name] MRN: [id number] DOB:Jun 25, 1956, 67 y.o., male Today's Date: 10/21/2023  END OF SESSION:  PT End of Session - 10/21/23 1356     Visit Number 2    Number of Visits 10    Date for PT Re-Evaluation 12/06/23    Authorization Type [employer] MCR    Progress Note Due on Visit 10    PT Start Time 1345    PT Stop Time 1425    PT Time Calculation (min) 40 min    Activity Tolerance Patient tolerated treatment well    Behavior During Therapy WFL for tasks assessed/performed             Past Medical History:  Diagnosis Date   Allergy to [place] pig dander    Arthritis    "hands and knees the most; mild" (03/15/2017)   Benign neoplasm of colon 07/19/2014   07/19/14 - 2 adenomas - repeat colonoscopy 2020    Cirrhosis (HCC)    Diverticulosis 2008   Epilepsy (HCC)    Esophageal varices with bleeding (HCC) 07/19/2014   Falls related to headaches    Fatty liver 2011   Abd [name]   Headaches thought due to old head injury    Hepatic cirrhosis due to chronic hepatitis C infection (HCC) and some component of alcohol use 07/19/2014   Hepatitis C    History of blood transfusion 1987   "related to the zygomatic arch fracture"   Iron deficiency anemia secondary to blood loss (chronic) 07/19/2014   Portal hypertensive gastropathy (HCC) 07/19/2014   Scoliosis    Seizures (HCC)    Torn rotator cuff    Past Surgical History:  Procedure Laterality Date   ABDOMINAL SURGERY     Knife puncture to Stomach   COLONOSCOPY  2008   COLONOSCOPY N/A 07/19/2014   Procedure: COLONOSCOPY;  Surgeon: Iva Boop, MD;  Location: [facility] ENDOSCOPY;  Service: Endoscopy;  Laterality: N/A;   COLONOSCOPY WITH PROPOFOL N/A 06/19/2021   Procedure: COLONOSCOPY WITH PROPOFOL;  Surgeon: Iva Boop, MD;  Location: [facility] ENDOSCOPY;  Service: Endoscopy;  Laterality: N/A;   ESOPHAGEAL BANDING  04/14/2017   Procedure: ESOPHAGEAL BANDING;  Surgeon: Iva Boop, MD;  Location: [facility] ENDOSCOPY;  Service: Endoscopy;;   ESOPHAGOGASTRODUODENOSCOPY N/A 07/19/2014   Procedure: ESOPHAGOGASTRODUODENOSCOPY (EGD);  Surgeon: Iva Boop, MD;  Location: [facility] ENDOSCOPY;  Service: Endoscopy;  Laterality: N/A;   ESOPHAGOGASTRODUODENOSCOPY N/A 09/05/2014   Procedure: ESOPHAGOGASTRODUODENOSCOPY (EGD);  Surgeon: Iva Boop, MD;  Location: [facility] ENDOSCOPY;  Service: Endoscopy;  Laterality: N/A;   ESOPHAGOGASTRODUODENOSCOPY N/A 10/17/2014   Procedure: ESOPHAGOGASTRODUODENOSCOPY (EGD);  Surgeon: Iva Boop, MD;  Location: [facility] ENDOSCOPY;  Service: Endoscopy;  Laterality: N/A;   ESOPHAGOGASTRODUODENOSCOPY N/A 12/19/2014   Procedure: ESOPHAGOGASTRODUODENOSCOPY (EGD);  Surgeon: Iva Boop, MD;  Location: [facility] ENDOSCOPY;  Service: Endoscopy;  Laterality: N/A;   ESOPHAGOGASTRODUODENOSCOPY N/A 03/16/2017   Procedure: ESOPHAGOGASTRODUODENOSCOPY (EGD);  Surgeon: Meryl Dare, MD;  Location: [facility] ENDOSCOPY;  Service: Endoscopy;  Laterality: N/A;   ESOPHAGOGASTRODUODENOSCOPY (EGD) WITH PROPOFOL N/A 11/21/2015   Procedure: ESOPHAGOGASTRODUODENOSCOPY (EGD) WITH PROPOFOL;  Surgeon: Iva Boop, MD;  Location: [facility] ENDOSCOPY;  Service: Endoscopy;  Laterality: N/A;   ESOPHAGOGASTRODUODENOSCOPY (EGD) WITH PROPOFOL N/A 04/14/2017   Procedure: ESOPHAGOGASTRODUODENOSCOPY (EGD) WITH PROPOFOL;  Surgeon: Iva Boop, MD;  Location: [facility] ENDOSCOPY;  Service: Endoscopy;  Laterality: N/A;   ESOPHAGOGASTRODUODENOSCOPY (EGD) WITH PROPOFOL N/A 07/23/2017   Procedure: ESOPHAGOGASTRODUODENOSCOPY (EGD) WITH PROPOFOL;  Surgeon: Iva Boop, MD;  Location: [facility] ENDOSCOPY;  Service: Endoscopy;  Laterality: N/A;   ESOPHAGOGASTRODUODENOSCOPY (EGD) WITH PROPOFOL N/A 06/19/2021   Procedure: ESOPHAGOGASTRODUODENOSCOPY (EGD) WITH PROPOFOL;  Surgeon: Iva Boop, MD;  Location: [facility] ENDOSCOPY;  Service: Endoscopy;  Laterality: N/A;   ESOPHAGOGASTRODUODENOSCOPY (EGD) WITH PROPOFOL N/A 10/22/2022    Procedure: ESOPHAGOGASTRODUODENOSCOPY (EGD) WITH PROPOFOL;  Surgeon: Iva Boop, MD;  Location: [facility] ENDOSCOPY;  Service: Gastroenterology;  Laterality: N/A;   FRACTURE SURGERY Right    cheek   GASTRIC VARICES BANDING N/A 10/17/2014   Procedure: GASTRIC VARICES BANDING;  Surgeon: Iva Boop, MD;  Location: [facility] ENDOSCOPY;  Service: Endoscopy;  Laterality: N/A;   ORIF ZYGOMATIC FRACTURE Right 1987   RADIUS SYNOSTOSIS PROXIMAL RESECTION W/ ALLOGRAFT Right 1980s   at the elbow   THYROIDECTOMY, PARTIAL Left 2012   Patient Active Problem List   Diagnosis Date Noted   Idiopathic pulmonary fibrosis (HCC) 02/16/2022   Positive ANA (antinuclear antibody) 02/16/2022   Rheumatoid factor positive 02/16/2022   Thrombocytopenia (HCC) 01/21/2022   Umbilical hernia without obstruction and without gangrene 05/14/2021   Bleeding esophageal varices (HCC)    Portal hypertension (HCC)    History of alcoholism (HCC)    History of hepatitis C    Repeated falls 08/31/2016   Hepatic cirrhosis (HCC) 01/31/2015   Portal hypertensive gastropathy (HCC) 07/19/2014   Hepatic cirrhosis due to chronic hepatitis C infection (HCC) and some component of alcohol use 07/19/2014   History of colonic polyps 07/19/2014   Iron deficiency anemia due to chronic blood loss 07/19/2014    PCP: Octavia Heir, NP   REFERRING PROVIDER: Kirtland Bouchard, PA-C  REFERRING DIAG: [phone] (ICD-10-CM) - Right knee pain, unspecified chronicity M54.2 (ICD-10-CM) - Neck pain  Rationale for Evaluation and Treatment: Rehabilitation  THERAPY DIAG:  Cervicalgia  Chronic pain of right knee  Muscle weakness (generalized)  ONSET DATE: 5 months of Right knee, 2.5 years neck pain   SUBJECTIVE:                                                                                                                                                                                           SUBJECTIVE STATEMENT: He relays about 4/10 neck pain  today, he did not notice any significant improvement from DN. He has only done the HEP about 3 times  PERTINENT HISTORY:  Falls, epilepsy  PAIN:  NPRS scale: 4/10  for neck upon arrival,   Pain location:Rt neck behind his ear to about half way down his shoulder Pain description: constant, Aggravating factors: turning his head to Right, looking down Relieving factors:  laying down.    PRECAUTIONS:  None  RED FLAGS: None   WEIGHT BEARING RESTRICTIONS:  No  FALLS:  Has patient fallen in last 6 months? Yes. Number of falls 3  OCCUPATION:  Retired, does stay active and takes his dog for walks  PLOF:  Independent  PATIENT GOALS:  Learn how to reduce the neck pain   OBJECTIVE:  Note: Objective measures were completed at Evaluation unless otherwise noted.  DIAGNOSTIC FINDINGS:  Imaging: XR KNEE 3 VIEW RIGHT   Result Date: 09/27/2023 Right knee 2 views: Narrowing medial lateral joint line consistent with moderate arthritic changes.  Osteophytes off the lateral margin of the lateral femoral condyle.  Mild to moderate patellofemoral changes.  No acute fractures or acute findings.  Knee is well located.  Chondrocalcinosis noted.   XR Cervical Spine 2 or 3 views   Result Date: 09/27/2023 Cervical spine 2 views: Loss of lordotic curvature.  Degenerative changes with loss of disc space at C3-C4 and C5-6 C7.  Anterior osteophytes C3 3 C7.  No acute fractures no spondylolisthesis.  No acute findings.   PATIENT SURVEYS:  FOTO 58% functional, goal 61%  COGNITIVE STATUS: Within functional limits for tasks assessed   SENSATION: WFL   GAIT: Comments: WFL   Body Part #1 NECK  PALPATION: Tender to palpation in Right upper trap area. Cervical hypomobility noted  CERVICAL ROM:   Active ROM A/PROM (deg) eval  Flexion 40 with pain  Extension 40 with dizziness  Right lateral flexion 20  Left lateral flexion 20  Right rotation 45  Left rotation 53   (Blank rows = not  tested)   UPPER EXTREMITY MMT:  MMT Right eval Left eval  Shoulder flexion 4 5  Shoulder extension    Shoulder abduction 4 5  Shoulder adduction    Shoulder extension    Shoulder internal rotation 5 5  Shoulder external rotation 5 5  Middle trapezius    Lower trapezius    Elbow flexion 5 5  Elbow extension 5 5  Wrist flexion    Wrist extension    Wrist ulnar deviation    Wrist radial deviation    Wrist pronation    Wrist supination    Grip strength 5 5   (Blank rows = not tested)    SPECIAL TESTS:  Cervical Spurling's test: Negative and Distraction test: Negative      Body Part #2 Knee   LOWER EXTREMITY ROM:     Active  Right eval Left eval  Hip flexion    Hip extension    Hip abduction    Hip adduction    Hip internal rotation    Hip external rotation    Knee flexion WFL   Knee extension WFL   Ankle dorsiflexion    Ankle plantarflexion    Ankle inversion    Ankle eversion     (Blank rows = not tested)   LOWER EXTREMITY MMT:    MMT Right eval Left eval  Hip flexion    Hip extension    Hip abduction    Hip adduction    Hip internal rotation    Hip external rotation    Knee flexion 5   Knee extension 5   Ankle dorsiflexion    Ankle plantarflexion    Ankle inversion    Ankle eversion     (Blank rows = not tested)   TODAY'S TREATMENT:  10/21/23 Therex Moist heat X10 min with subjective and HEP review Seated cervical  rotation AAROM with towel 5 sec X 10 bilat Seated cervical extension MWM with towel 5 sec  X10 Seated chin tucks 2X10 Seated upper trap stretch 3X30 sec bilat Seated neck isometrics 5 sec X 10 for flexion, ext, sidebending bilat  Manual: STM with cupping to bilateral neck paraspinals and upper traps    Eval HEP creation and review with demonstration and trial set preformed, see below for details Manual therapy for active compression and skilled palpation with  Trigger Point Dry-Needling  Treatment instructions:  Expect mild to moderate muscle soreness. Patient Consent Given: Yes Education handout provided: YES Muscles treated: Right cerivical paraspinals, multifidi, upper trap Estim combined: NO Treatment response/outcome: good overall tolerance,twitch response noted  Moist heat X 10 min to neck with HEP creation and review     PATIENT EDUCATION: Education details: HEP, PT plan of care,DN Person educated: Patient Education method: Explanation, Demonstration, Verbal cues, and Handouts Education comprehension: verbalized understanding and needs further education   HOME EXERCISE PROGRAM: Access Code: [email or web address] URL: [email or web address] Date: 10/11/2023 Prepared by: Ivery Quale  Exercises - Seated Assisted Cervical Rotation with Towel  - 2 x daily - 6 x weekly - 1-2 sets - 10 reps - 5 hold - Seated Passive Cervical Retraction  - 2 x daily - 6 x weekly - 1-2 sets - 10 reps - Seated Upper Trapezius Stretch  - 2 x daily - 6 x weekly - 1 sets - 3 reps - 30 sec hold - Standing Isometric Cervical Sidebending with Manual Resistance  - 2 x daily - 6 x weekly - 1 sets - 10 reps - 5 sec hold - Standing Isometric Cervical Flexion with Manual Resistance  - 2 x daily - 6 x weekly - 1 sets - 10 reps - 5 sec hold - Standing Isometric Cervical Extension with Manual Resistance (Mirrored)  - 2 x daily - 6 x weekly - 1 sets - 10 reps - 5 hold  ASSESSMENT:  CLINICAL IMPRESSION: He did not get significant relief from DN so this was held today and I trialed soft tissue mobilization with cupping therapy instead to see if this benefits him any. This was followed by HEP review and he has good understanding of these but will need to be more consistent with them at home.  OBJECTIVE IMPAIRMENTS: decreased activity tolerance,  decreased balance, decreased endurance, decreased mobility, decreased ROM, decreased strength, impaired flexibility, impaired UE/LE use, postural dysfunction, and pain.  ACTIVITY  LIMITATIONS: bending, lifting, carry, locomotion, cleaning, community activity, driving  PERSONAL FACTORS: epilepsy with history of falls, are also affecting patient's functional outcome.  REHAB POTENTIAL: Good  CLINICAL DECISION MAKING: Stable/uncomplicated  EVALUATION COMPLEXITY: Low    GOALS: Short term PT Goals Target date: 11/08/2023   Pt will be I and compliant with HEP. Baseline:  Goal status:ongoing 10/21/23 Pt will decrease pain by 25% overall Baseline:can get to 9/10 Goal status: ongoing 10/21/23  Long term PT goals Target date:12/06/2023   Pt will improve neck rotation AROM to [facility] (at least 60 deg of rotation) to improve functional mobility Baseline: Goal status: New Pt will improve Rt shoulder strength to  5-/5 MMT to improve functional strength Baseline: Goal status: New Pt will improve FOTO to at least 61% functional to show improved function Baseline: Goal status: New Pt will reduce pain to overall less than 2-3/10 with usual activity  Baseline: Goal status: New  PLAN: PT FREQUENCY: 1-2 times per week   PT DURATION: 4-8 weeks  PLANNED INTERVENTIONS (unless  contraindicated): aquatic PT, Canalith repositioning, cryotherapy, Electrical stimulation, Iontophoresis with 4 mg/ml dexamethasome, Moist heat, traction, Ultrasound, gait training, Therapeutic exercise, balance training, neuromuscular re-education, patient/family education, prosthetic training, manual techniques, passive ROM, dry needling, taping, vasopnuematic device, vestibular, spinal manipulations, joint manipulations  PLAN FOR NEXT SESSION: how was cupping and repeat if desired. work on neck ROM and gentle Rt shoulder flexion/abd strength as tolerated.   NEXT MD VISIT: 11/08/23  April Manson, PT,DPT 10/21/2023, 2:22 PM   Date of referral: 09/29/23 Referring provider: Kirtland Bouchard, PA-C Referring diagnosis?  Treatment diagnosis? (if different than referring diagnosis) M25.561  (ICD-10-CM) - Right knee pain, unspecified chronicity M54.2 (ICD-10-CM) - Neck pain  What was this (referring dx) caused by? Fall, Ongoing Issue, and Arthritis  Nature of Condition: Chronic (continuous duration > 3 months)   Laterality: Right knee and Right neck   Current Functional Measure Score: FOTO 58% functional, goal is61%  Objective measurements identify impairments when they are compared to normal values, the uninvolved extremity, and prior level of function.  [x]  Yes  []  No  Objective assessment of functional ability: Moderate functional limitations   Briefly describe symptoms: right sided neck pain with hypomobility noted and right upper trap strain present at eval as well as decreased Rt shoulder strength compared to Lt.   How did symptoms start:chronic pain but worse after falls  Average pain intensity:  Last 24 hours: 2/10  Past week: 9/10  How often does the pt experience symptoms? Constantly  How much have the symptoms interfered with usual daily activities? Moderately  How has condition changed since care began at this facility? NA - initial visit  In general, how is the patients overall health? Fair

## 2023-10-21 NOTE — Progress Notes (Signed)
Francisco [name] 67 y.o. [birth year] [id number]  Assessment & Plan:   Encounter Diagnoses  Name Primary?   Hepatic cirrhosis due to chronic hepatitis C infection (HCC) Yes   Secondary esophageal varices with ? prior bleeding (HCC)    Portal hypertensive gastropathy (HCC)    Splenomegaly    Umbilical hernia without obstruction and without gangrene      Cirrhosis Stable with no new symptoms. Recent labs and ultrasound showed no new findings. Platelets remain low, consistent with previous findings. No evidence of hepatomegaly or ascites on physical exam. Spleen tip slightly palpable. -Continue current management with Carvedilol. -Order AFP test today for ongoing cancer surveillance. -Encourage continued abstinence from alcohol.  Tobacco Use Patient reports smoking approximately 3 cigarettes per day and is working on quitting. -Encourage continued efforts to quit smoking.  Umbilical Hernia Soft, reducible umbilical hernia noted on physical exam. -No specific intervention needed at this time.      Overall doing well it seems.  Only needs an AFP.  I think he can follow-up in the clinic in a year and we will continue routine monitoring of labs and ultrasound.  He does not need EGD routinely because he is now on carvedilol.  Blood pressure and pulse show that current dose is appropriate I think.  I do agree with avoiding NSAIDs and acetaminophen up to 2000 mg daily as needed is acceptable he takes 1 tablet every few days.  Reviewed how acetaminophen is actually safe and liver disease unless taken in large quantities or regularly with alcohol.  Orders Placed This Encounter  Procedures   AFP tumor marker     Subjective:  GI problem summary   #1-Cirrhosis from HCV/alcohol (seen here since 2015).  With history of variceal bleeding, status post banding.  Last EGD 2022 very small varices and portal gastropathy no bleeding problems for years.  Nadolol stopped due to suspected side  effects (headaches) HCV eradicated with Harvoni (Dr. Luciana Axe).  Transient hepatic encephalopathy possible 2018.  Hepatocellular carcinoma screening has been negative with ultrasound and AFP over the years. Liver biopsy 2011 1. Liver, needle/core biopsy, three 18-gauge :   CHRONIC ACTIVE HEPATITIS WITH ADVANCED FIBROSIS CHARACTERIZED BY,   MILD ACTIVITY, GRADE 2,   INCREASED FIBROSIS, STAGE 3/4   SLIGHTLY INCREASED IRON.      #2 personal history of colon polyps-2 adenomas 2015, no polyps June 2022 10-year recall   ---------------------------------------------------------------------------  Chief Complaint: cirrhosis f/u  Discussed the use of AI scribe software for clinical note transcription with the patient, who gave verbal consent to proceed.  HPI  The patient, with a known history of cirrhosis, presented for a routine follow-up. He reported no new or ongoing health issues. Recent labs were reviewed and were noted to be satisfactory, with the exception of persistently low platelets, a known issue. An ultrasound conducted in August showed cirrhosis, gallstones, an enlarged spleen, but no liver tumors.  The patient also reported muscle spasms in the trapezius, for which he had undergone dry needling.  The patient's last endoscopy was in November of the previous year, which showed some very small varices and portal gastropathy. Even though there was a history of possible variceal bleeding, it was unclear if that was true and given stability over time he was started on carvedilol, which he reported to be tolerating well.  The patient also has a history of alcohol and tobacco use. He reported continued abstinence from alcohol, but was still smoking about three cigarettes a day. He  had a brief relapse with alcohol the previous year, but group therapy was reported to be helpful in maintaining sobriety.     Haim reports that Dr. Candise Che recommended not using NSAIDs using Tylenol for aches and pains  etc.   Wt Readings from Last 3 Encounters:  10/21/23 196 lb 6 oz (89.1 kg)  10/13/23 194 lb 3.2 oz (88.1 kg)  09/09/23 201 lb 6.4 oz (91.4 kg)     Lab Results  Component Value Date   WBC 3.9 (L) 10/13/2023   HGB 14.0 10/13/2023   HCT 38.6 (L) 10/13/2023   MCV 88.1 10/13/2023   PLT 55 (L) 10/13/2023     Chemistry      Component Value Date/Time   NA 137 10/13/2023 1341   K 3.8 10/13/2023 1341   CL 108 10/13/2023 1341   CO2 25 10/13/2023 1341   BUN 12 10/13/2023 1341   CREATININE 0.68 10/13/2023 1341   CREATININE 0.66 (L) 09/06/2023 0912      Component Value Date/Time   CALCIUM 10.3 10/13/2023 1341   ALKPHOS 88 10/13/2023 1341   AST 25 10/13/2023 1341   ALT 18 10/13/2023 1341   BILITOT 1.3 (H) 10/13/2023 1341     Lab Results  Component Value Date   INR 1.3 (H) 10/13/2023   INR 1.2 10/02/2022   INR 1.1 (H) 08/26/2022    [name] 07/31/23  IMPRESSION: 1. Cholelithiasis without sonographic evidence of acute cholecystitis. 2. Cirrhotic liver. No focal liver lesion identified. 3. Splenomegaly.     Allergies  Allergen Reactions   Other Shortness Of Breath    [place] Pig - Upper Respiratory    Current Meds  Medication Sig   carvedilol (COREG) 3.125 MG tablet TAKE 1 TABLET BY MOUTH TWICE A DAY WITH A MEAL   Fluticasone-Umeclidin-Vilant (TRELEGY ELLIPTA) 200-62.5-25 MCG/ACT AEPB Inhale 1 puff into the lungs daily.   levETIRAcetam (KEPPRA) 500 MG tablet Take 1 tablet (500 mg total) by mouth 2 (two) times daily.   Multiple Vitamins-Minerals (MULTIVITAMIN WITH MINERALS) tablet Take 1 tablet by mouth daily.   Pirfenidone (ESBRIET) 801 MG TABS Take 801 mg by mouth 3 (three) times daily.   Past Medical History:  Diagnosis Date   Allergy to [place] pig dander    Arthritis    "hands and knees the most; mild" (03/15/2017)   Benign neoplasm of colon 07/19/2014   07/19/14 - 2 adenomas - repeat colonoscopy 2020    Cirrhosis (HCC)    Diverticulosis 2008   Epilepsy (HCC)     Esophageal varices with bleeding (HCC) 07/19/2014   Falls related to headaches    Fatty liver 2011   Abd [name]   Headaches thought due to old head injury    Hepatic cirrhosis due to chronic hepatitis C infection (HCC) and some component of alcohol use 07/19/2014   Hepatitis C    History of blood transfusion 1987   "related to the zygomatic arch fracture"   Iron deficiency anemia secondary to blood loss (chronic) 07/19/2014   Portal hypertensive gastropathy (HCC) 07/19/2014   Scoliosis    Seizures (HCC)    Torn rotator cuff    Past Surgical History:  Procedure Laterality Date   ABDOMINAL SURGERY     Knife puncture to Stomach   COLONOSCOPY  2008   COLONOSCOPY N/A 07/19/2014   Procedure: COLONOSCOPY;  Surgeon: Iva Boop, MD;  Location: [facility] ENDOSCOPY;  Service: Endoscopy;  Laterality: N/A;   COLONOSCOPY WITH PROPOFOL N/A 06/19/2021   Procedure: COLONOSCOPY WITH  PROPOFOL;  Surgeon: Iva Boop, MD;  Location: [facility] ENDOSCOPY;  Service: Endoscopy;  Laterality: N/A;   ESOPHAGEAL BANDING  04/14/2017   Procedure: ESOPHAGEAL BANDING;  Surgeon: Iva Boop, MD;  Location: [facility] ENDOSCOPY;  Service: Endoscopy;;   ESOPHAGOGASTRODUODENOSCOPY N/A 07/19/2014   Procedure: ESOPHAGOGASTRODUODENOSCOPY (EGD);  Surgeon: Iva Boop, MD;  Location: [facility] ENDOSCOPY;  Service: Endoscopy;  Laterality: N/A;   ESOPHAGOGASTRODUODENOSCOPY N/A 09/05/2014   Procedure: ESOPHAGOGASTRODUODENOSCOPY (EGD);  Surgeon: Iva Boop, MD;  Location: [facility] ENDOSCOPY;  Service: Endoscopy;  Laterality: N/A;   ESOPHAGOGASTRODUODENOSCOPY N/A 10/17/2014   Procedure: ESOPHAGOGASTRODUODENOSCOPY (EGD);  Surgeon: Iva Boop, MD;  Location: [facility] ENDOSCOPY;  Service: Endoscopy;  Laterality: N/A;   ESOPHAGOGASTRODUODENOSCOPY N/A 12/19/2014   Procedure: ESOPHAGOGASTRODUODENOSCOPY (EGD);  Surgeon: Iva Boop, MD;  Location: [facility] ENDOSCOPY;  Service: Endoscopy;  Laterality: N/A;   ESOPHAGOGASTRODUODENOSCOPY N/A 03/16/2017   Procedure:  ESOPHAGOGASTRODUODENOSCOPY (EGD);  Surgeon: Meryl Dare, MD;  Location: [facility] ENDOSCOPY;  Service: Endoscopy;  Laterality: N/A;   ESOPHAGOGASTRODUODENOSCOPY (EGD) WITH PROPOFOL N/A 11/21/2015   Procedure: ESOPHAGOGASTRODUODENOSCOPY (EGD) WITH PROPOFOL;  Surgeon: Iva Boop, MD;  Location: [facility] ENDOSCOPY;  Service: Endoscopy;  Laterality: N/A;   ESOPHAGOGASTRODUODENOSCOPY (EGD) WITH PROPOFOL N/A 04/14/2017   Procedure: ESOPHAGOGASTRODUODENOSCOPY (EGD) WITH PROPOFOL;  Surgeon: Iva Boop, MD;  Location: [facility] ENDOSCOPY;  Service: Endoscopy;  Laterality: N/A;   ESOPHAGOGASTRODUODENOSCOPY (EGD) WITH PROPOFOL N/A 07/23/2017   Procedure: ESOPHAGOGASTRODUODENOSCOPY (EGD) WITH PROPOFOL;  Surgeon: Iva Boop, MD;  Location: [facility] ENDOSCOPY;  Service: Endoscopy;  Laterality: N/A;   ESOPHAGOGASTRODUODENOSCOPY (EGD) WITH PROPOFOL N/A 06/19/2021   Procedure: ESOPHAGOGASTRODUODENOSCOPY (EGD) WITH PROPOFOL;  Surgeon: Iva Boop, MD;  Location: [facility] ENDOSCOPY;  Service: Endoscopy;  Laterality: N/A;   ESOPHAGOGASTRODUODENOSCOPY (EGD) WITH PROPOFOL N/A 10/22/2022   Procedure: ESOPHAGOGASTRODUODENOSCOPY (EGD) WITH PROPOFOL;  Surgeon: Iva Boop, MD;  Location: [facility] ENDOSCOPY;  Service: Gastroenterology;  Laterality: N/A;   FRACTURE SURGERY Right    cheek   GASTRIC VARICES BANDING N/A 10/17/2014   Procedure: GASTRIC VARICES BANDING;  Surgeon: Iva Boop, MD;  Location: [facility] ENDOSCOPY;  Service: Endoscopy;  Laterality: N/A;   ORIF ZYGOMATIC FRACTURE Right 1987   RADIUS SYNOSTOSIS PROXIMAL RESECTION W/ ALLOGRAFT Right 1980s   at the elbow   THYROIDECTOMY, PARTIAL Left 2012   Social History   Social History Narrative   Divorced, one daughter   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 Lives with his dog in a 1 story home Father and his dog moved 2020.    Right handed   One story home          Marital status:   Divorced                            What year were you married ? 1990      Highest Level of education completed: 2/1/2 Years college      Current or past profession: Art therapist       Do you exercise?   Yes                           Type & how often Walk every day  ( 1/2-2 miles) [employer] work      ADVANCED DIRECTIVES (Please bring copies)  Do you have a living will? Yes      Do you have a DNR form?   Yes                    If not, do you want to discuss one?       Do you have signed POA?HPOA forms? Yes                If so, please bring to your appointment      FUNCTIONAL STATUS- To be completed by Spouse / child / Staff       Do you have difficulty bathing or dressing yourself ?  No      Do you have difficulty preparing food or eating ?  No      Do you have difficulty managing your mediation ?  No      Do you have difficulty managing your finances ?  No      Do you have difficulty affording your medication ?  No      family history includes Alcohol abuse in his maternal grandfather and maternal uncle; Ankylosing spondylitis (age of onset: 45) in his brother; Arthritis/Rheumatoid in his father; Breast cancer (age of onset: 51) in his mother; Cancer in his paternal grandfather; Crohn's disease (age of onset: 84) in his father; Depression (age of onset: 53) in his daughter; Epilepsy (age of onset: 83) in his sister; Hearing loss in his father; Heart disease in his maternal grandmother.   Review of Systems As per HPI  Objective:   Physical Exam BP 106/62   Pulse 61   Ht 5\' 11"  (1.803 m)   Wt 196 lb 6 oz (89.1 kg)   BMI 27.39 kg/m  Physical Exam   CHEST: Good air movement, slight crackles at bases, otherwise clear. CARDIOVASCULAR: Heart sounds are normal. ABDOMEN: Soft reducible umbilical hernia, spleen tip slightly palpable in left abdomen, no hepatomegaly, no ascites. NEUROLOGICAL: No asterixis, alert and oriented x 3     Data  reviewed as per HPI.  Have also reviewed 10/13/2023 visit with Dr. Candise Che of hematology.

## 2023-10-25 LAB — AFP TUMOR MARKER: AFP-Tumor Marker: 4 ng/mL (ref <6.1)

## 2023-10-28 ENCOUNTER — Encounter: Payer: Medicare Other | Admitting: Physical Therapy

## 2023-11-04 ENCOUNTER — Ambulatory Visit: Payer: Medicare Other | Admitting: Physical Therapy

## 2023-11-04 ENCOUNTER — Encounter: Payer: Self-pay | Admitting: Physical Therapy

## 2023-11-04 DIAGNOSIS — G8929 Other chronic pain: Secondary | ICD-10-CM | POA: Diagnosis not present

## 2023-11-04 DIAGNOSIS — M6281 Muscle weakness (generalized): Secondary | ICD-10-CM | POA: Diagnosis not present

## 2023-11-04 DIAGNOSIS — M542 Cervicalgia: Secondary | ICD-10-CM

## 2023-11-04 DIAGNOSIS — M25561 Pain in right knee: Secondary | ICD-10-CM

## 2023-11-04 NOTE — Therapy (Signed)
OUTPATIENT PHYSICAL THERAPY TREATMENT   Patient Name: Francisco [name] MRN: [id number] DOB:[birth year], 67 y.o., male Today's Date: 11/04/2023  END OF SESSION:  PT End of Session - 11/04/23 1344     Visit Number 3    Number of Visits 10    Date for PT Re-Evaluation 12/06/23    Authorization Type U[employer] MCR    Progress Note Due on Visit 10    PT Start Time 1344    PT Stop Time 1424    PT Time Calculation (min) 40 min    Activity Tolerance Patient tolerated treatment well    Behavior During Therapy WFL for tasks assessed/performed             Past Medical History:  Diagnosis Date   Allergy to [place] pig dander    Arthritis    "hands and knees the most; mild" (03/15/2017)   Benign neoplasm of colon 07/19/2014   07/19/14 - 2 adenomas - repeat colonoscopy 2020    Cirrhosis (HCC)    Diverticulosis 2008   Epilepsy (HCC)    Esophageal varices with bleeding (HCC) 07/19/2014   Falls related to headaches    Fatty liver 2011   Abd [name]   Headaches thought due to old head injury    Hepatic cirrhosis due to chronic hepatitis C infection (HCC) and some component of alcohol use 07/19/2014   Hepatitis C    History of blood transfusion 1987   "related to the zygomatic arch fracture"   Iron deficiency anemia secondary to blood loss (chronic) 07/19/2014   Portal hypertensive gastropathy (HCC) 07/19/2014   Scoliosis    Seizures (HCC)    Torn rotator cuff    Past Surgical History:  Procedure Laterality Date   ABDOMINAL SURGERY     Knife puncture to Stomach   COLONOSCOPY  2008   COLONOSCOPY N/A 07/19/2014   Procedure: COLONOSCOPY;  Surgeon: Iva Boop, MD;  Location: [facility] ENDOSCOPY;  Service: Endoscopy;  Laterality: N/A;   COLONOSCOPY WITH PROPOFOL N/A 06/19/2021   Procedure: COLONOSCOPY WITH PROPOFOL;  Surgeon: Iva Boop, MD;  Location: [facility] ENDOSCOPY;  Service: Endoscopy;  Laterality: N/A;   ESOPHAGEAL BANDING  04/14/2017   Procedure: ESOPHAGEAL BANDING;  Surgeon: Iva Boop, MD;  Location: [facility] ENDOSCOPY;  Service: Endoscopy;;   ESOPHAGOGASTRODUODENOSCOPY N/A 07/19/2014   Procedure: ESOPHAGOGASTRODUODENOSCOPY (EGD);  Surgeon: Iva Boop, MD;  Location: [facility] ENDOSCOPY;  Service: Endoscopy;  Laterality: N/A;   ESOPHAGOGASTRODUODENOSCOPY N/A 09/05/2014   Procedure: ESOPHAGOGASTRODUODENOSCOPY (EGD);  Surgeon: Iva Boop, MD;  Location: [facility] ENDOSCOPY;  Service: Endoscopy;  Laterality: N/A;   ESOPHAGOGASTRODUODENOSCOPY N/A 10/17/2014   Procedure: ESOPHAGOGASTRODUODENOSCOPY (EGD);  Surgeon: Iva Boop, MD;  Location: [facility] ENDOSCOPY;  Service: Endoscopy;  Laterality: N/A;   ESOPHAGOGASTRODUODENOSCOPY N/A 12/19/2014   Procedure: ESOPHAGOGASTRODUODENOSCOPY (EGD);  Surgeon: Iva Boop, MD;  Location: [facility] ENDOSCOPY;  Service: Endoscopy;  Laterality: N/A;   ESOPHAGOGASTRODUODENOSCOPY N/A 03/16/2017   Procedure: ESOPHAGOGASTRODUODENOSCOPY (EGD);  Surgeon: Meryl Dare, MD;  Location: [facility] ENDOSCOPY;  Service: Endoscopy;  Laterality: N/A;   ESOPHAGOGASTRODUODENOSCOPY (EGD) WITH PROPOFOL N/A 11/21/2015   Procedure: ESOPHAGOGASTRODUODENOSCOPY (EGD) WITH PROPOFOL;  Surgeon: Iva Boop, MD;  Location: [facility] ENDOSCOPY;  Service: Endoscopy;  Laterality: N/A;   ESOPHAGOGASTRODUODENOSCOPY (EGD) WITH PROPOFOL N/A 04/14/2017   Procedure: ESOPHAGOGASTRODUODENOSCOPY (EGD) WITH PROPOFOL;  Surgeon: Iva Boop, MD;  Location: [facility] ENDOSCOPY;  Service: Endoscopy;  Laterality: N/A;   ESOPHAGOGASTRODUODENOSCOPY (EGD) WITH PROPOFOL N/A 07/23/2017   Procedure: ESOPHAGOGASTRODUODENOSCOPY (EGD) WITH PROPOFOL;  Surgeon: Iva Boop, MD;  Location: [facility] ENDOSCOPY;  Service: Endoscopy;  Laterality: N/A;   ESOPHAGOGASTRODUODENOSCOPY (EGD) WITH PROPOFOL N/A 06/19/2021   Procedure: ESOPHAGOGASTRODUODENOSCOPY (EGD) WITH PROPOFOL;  Surgeon: Iva Boop, MD;  Location: [facility] ENDOSCOPY;  Service: Endoscopy;  Laterality: N/A;   ESOPHAGOGASTRODUODENOSCOPY (EGD) WITH PROPOFOL N/A 10/22/2022    Procedure: ESOPHAGOGASTRODUODENOSCOPY (EGD) WITH PROPOFOL;  Surgeon: Iva Boop, MD;  Location: [facility] ENDOSCOPY;  Service: Gastroenterology;  Laterality: N/A;   FRACTURE SURGERY Right    cheek   GASTRIC VARICES BANDING N/A 10/17/2014   Procedure: GASTRIC VARICES BANDING;  Surgeon: Iva Boop, MD;  Location: [facility] ENDOSCOPY;  Service: Endoscopy;  Laterality: N/A;   ORIF ZYGOMATIC FRACTURE Right 1987   RADIUS SYNOSTOSIS PROXIMAL RESECTION W/ ALLOGRAFT Right 1980s   at the elbow   THYROIDECTOMY, PARTIAL Left 2012   Patient Active Problem List   Diagnosis Date Noted   Idiopathic pulmonary fibrosis (HCC) 02/16/2022   Positive ANA (antinuclear antibody) 02/16/2022   Rheumatoid factor positive 02/16/2022   Thrombocytopenia (HCC) 01/21/2022   Umbilical hernia without obstruction and without gangrene 05/14/2021   Bleeding esophageal varices (HCC)    Portal hypertension (HCC)    History of alcoholism (HCC)    History of hepatitis C    Repeated falls 08/31/2016   Hepatic cirrhosis (HCC) 01/31/2015   Portal hypertensive gastropathy (HCC) 07/19/2014   Hepatic cirrhosis due to chronic hepatitis C infection (HCC) and some component of alcohol use 07/19/2014   History of colonic polyps 07/19/2014   Iron deficiency anemia due to chronic blood loss 07/19/2014    PCP: Octavia Heir, NP   REFERRING PROVIDER: Kirtland Bouchard, PA-C  REFERRING DIAG: [phone] (ICD-10-CM) - Right knee pain, unspecified chronicity M54.2 (ICD-10-CM) - Neck pain  Rationale for Evaluation and Treatment: Rehabilitation  THERAPY DIAG:  Cervicalgia  Chronic pain of right knee  Muscle weakness (generalized)  ONSET DATE: 5 months of Right knee, 2.5 years neck pain   SUBJECTIVE:                                                                                                                                                                                           SUBJECTIVE STATEMENT: He relays he is overall feeling  better, pain is about half of what if was. He has been doing HEP which has really been helping. Cupping therapy may have helped some as well so he was open to having that again today in PT.  PERTINENT HISTORY:  Falls, epilepsy  PAIN:  NPRS scale: 2/10  for neck upon arrival,   Pain location:Rt neck behind his ear to about  half way down his shoulder Pain description: constant, Aggravating factors: turning his head to Right, looking down Relieving factors: laying down.    PRECAUTIONS:  None  RED FLAGS: None   WEIGHT BEARING RESTRICTIONS:  No  FALLS:  Has patient fallen in last 6 months? Yes. Number of falls 3  OCCUPATION:  Retired, does stay active and takes his dog for walks  PLOF:  Independent  PATIENT GOALS:  Learn how to reduce the neck pain   OBJECTIVE:  Note: Objective measures were completed at Evaluation unless otherwise noted.  DIAGNOSTIC FINDINGS:  Imaging: XR KNEE 3 VIEW RIGHT   Result Date: 09/27/2023 Right knee 2 views: Narrowing medial lateral joint line consistent with moderate arthritic changes.  Osteophytes off the lateral margin of the lateral femoral condyle.  Mild to moderate patellofemoral changes.  No acute fractures or acute findings.  Knee is well located.  Chondrocalcinosis noted.   XR Cervical Spine 2 or 3 views   Result Date: 09/27/2023 Cervical spine 2 views: Loss of lordotic curvature.  Degenerative changes with loss of disc space at C3-C4 and C5-6 C7.  Anterior osteophytes C3 3 C7.  No acute fractures no spondylolisthesis.  No acute findings.   PATIENT SURVEYS:  FOTO 58% functional, goal 61%  COGNITIVE STATUS: Within functional limits for tasks assessed   SENSATION: WFL   GAIT: Comments: WFL   Body Part #1 NECK  PALPATION: Tender to palpation in Right upper trap area. Cervical hypomobility noted  CERVICAL ROM:   Active ROM A/PROM (deg) eval  Flexion 40 with pain  Extension 40 with dizziness  Right lateral flexion 20   Left lateral flexion 20  Right rotation 45  Left rotation 53   (Blank rows = not tested)   UPPER EXTREMITY MMT:  MMT Right eval Left eval  Shoulder flexion 4 5  Shoulder extension    Shoulder abduction 4 5  Shoulder adduction    Shoulder extension    Shoulder internal rotation 5 5  Shoulder external rotation 5 5  Middle trapezius    Lower trapezius    Elbow flexion 5 5  Elbow extension 5 5  Wrist flexion    Wrist extension    Wrist ulnar deviation    Wrist radial deviation    Wrist pronation    Wrist supination    Grip strength 5 5   (Blank rows = not tested)    SPECIAL TESTS:  Cervical Spurling's test: Negative and Distraction test: Negative      Body Part #2 Knee   LOWER EXTREMITY ROM:     Active  Right eval Left eval  Hip flexion    Hip extension    Hip abduction    Hip adduction    Hip internal rotation    Hip external rotation    Knee flexion WFL   Knee extension WFL   Ankle dorsiflexion    Ankle plantarflexion    Ankle inversion    Ankle eversion     (Blank rows = not tested)   LOWER EXTREMITY MMT:    MMT Right eval Left eval  Hip flexion    Hip extension    Hip abduction    Hip adduction    Hip internal rotation    Hip external rotation    Knee flexion 5   Knee extension 5   Ankle dorsiflexion    Ankle plantarflexion    Ankle inversion    Ankle eversion     (Blank rows = not  tested)   TODAY'S TREATMENT:  11/04/23 Therex Moist heat X7 min not included in treatment time Seated cervical rotation AAROM with towel 5 sec X 10 bilat Seated chin tucks X10 Seated upper trap stretch 3X30 sec bilat Seated neck isometrics 5 sec X 10 for flexion, ext, sidebending bilat  Manual: STM with cupping to bilateral neck paraspinals and upper traps   10/21/23 Therex Moist heat X10 min with subjective and HEP review Seated cervical rotation AAROM with towel 5 sec X 10 bilat Seated cervical extension MWM with towel 5 sec  X10 Seated  chin tucks 2X10 Seated upper trap stretch 3X30 sec bilat Seated neck isometrics 5 sec X 10 for flexion, ext, sidebending bilat  Manual: STM with cupping to bilateral neck paraspinals and upper traps    Eval HEP creation and review with demonstration and trial set preformed, see below for details Manual therapy for active compression and skilled palpation with  Trigger Point Dry-Needling  Treatment instructions: Expect mild to moderate muscle soreness. Patient Consent Given: Yes Education handout provided: YES Muscles treated: Right cerivical paraspinals, multifidi, upper trap Estim combined: NO Treatment response/outcome: good overall tolerance,twitch response noted  Moist heat X 10 min to neck with HEP creation and review     PATIENT EDUCATION: Education details: HEP, PT plan of care,DN Person educated: Patient Education method: Explanation, Demonstration, Verbal cues, and Handouts Education comprehension: verbalized understanding and needs further education   HOME EXERCISE PROGRAM: Access Code: [email or web address] URL: [email or web address] Date: 10/11/2023 Prepared by: Ivery Quale  Exercises - Seated Assisted Cervical Rotation with Towel  - 2 x daily - 6 x weekly - 1-2 sets - 10 reps - 5 hold - Seated Passive Cervical Retraction  - 2 x daily - 6 x weekly - 1-2 sets - 10 reps - Seated Upper Trapezius Stretch  - 2 x daily - 6 x weekly - 1 sets - 3 reps - 30 sec hold - Standing Isometric Cervical Sidebending with Manual Resistance  - 2 x daily - 6 x weekly - 1 sets - 10 reps - 5 sec hold - Standing Isometric Cervical Flexion with Manual Resistance  - 2 x daily - 6 x weekly - 1 sets - 10 reps - 5 sec hold - Standing Isometric Cervical Extension with Manual Resistance (Mirrored)  - 2 x daily - 6 x weekly - 1 sets - 10 reps - 5 hold  ASSESSMENT:  CLINICAL IMPRESSION: He relays improvement in is pain and tightness since starting PT and feels the HEP is really helping. He  had improved overall neck ROM noted today. PT recommending to continue with current treatment plan.  OBJECTIVE IMPAIRMENTS: decreased activity tolerance,  decreased balance, decreased endurance, decreased mobility, decreased ROM, decreased strength, impaired flexibility, impaired UE/LE use, postural dysfunction, and pain.  ACTIVITY LIMITATIONS: bending, lifting, carry, locomotion, cleaning, community activity, driving  PERSONAL FACTORS: epilepsy with history of falls, are also affecting patient's functional outcome.  REHAB POTENTIAL: Good  CLINICAL DECISION MAKING: Stable/uncomplicated  EVALUATION COMPLEXITY: Low    GOALS: Short term PT Goals Target date: 11/08/2023   Pt will be I and compliant with HEP. Baseline:  Goal status:ongoing 10/21/23 Pt will decrease pain by 25% overall Baseline:can get to 9/10 Goal status: ongoing 10/21/23  Long term PT goals Target date:12/06/2023   Pt will improve neck rotation AROM to [facility] (at least 60 deg of rotation) to improve functional mobility Baseline: Goal status: MET 11/04/23 Pt will improve Rt shoulder strength to  5-/5 MMT to  improve functional strength Baseline: Goal status: ongoing 11/14 Pt will improve FOTO to at least 61% functional to show improved function Baseline: Goal status: ongoing 11/14 Pt will reduce pain to overall less than 2-3/10 with usual activity  Baseline: Goal status: ongoing 11/14 PLAN: PT FREQUENCY: 1-2 times per week   PT DURATION: 4-8 weeks  PLANNED INTERVENTIONS (unless contraindicated): aquatic PT, Canalith repositioning, cryotherapy, Electrical stimulation, Iontophoresis with 4 mg/ml dexamethasome, Moist heat, traction, Ultrasound, gait training, Therapeutic exercise, balance training, neuromuscular re-education, patient/family education, prosthetic training, manual techniques, passive ROM, dry needling, taping, vasopnuematic device, vestibular, spinal manipulations, joint manipulations  PLAN FOR  NEXT SESSION: STM and work on neck ROM and gentle Rt shoulder flexion/abd strength as tolerated.   NEXT MD VISIT: 11/08/23  April Manson, PT,DPT 11/04/2023, 1:45 PM   Date of referral: 09/29/23 Referring provider: Kirtland Bouchard, PA-C Referring diagnosis?  Treatment diagnosis? (if different than referring diagnosis) M25.561 (ICD-10-CM) - Right knee pain, unspecified chronicity M54.2 (ICD-10-CM) - Neck pain  What was this (referring dx) caused by? Fall, Ongoing Issue, and Arthritis  Nature of Condition: Chronic (continuous duration > 3 months)   Laterality: Right knee and Right neck   Current Functional Measure Score: FOTO 58% functional, goal is61%  Objective measurements identify impairments when they are compared to normal values, the uninvolved extremity, and prior level of function.  [x]  Yes  []  No  Objective assessment of functional ability: Moderate functional limitations   Briefly describe symptoms: right sided neck pain with hypomobility noted and right upper trap strain present at eval as well as decreased Rt shoulder strength compared to Lt.   How did symptoms start:chronic pain but worse after falls  Average pain intensity:  Last 24 hours: 2/10  Past week: 9/10  How often does the pt experience symptoms? Constantly  How much have the symptoms interfered with usual daily activities? Moderately  How has condition changed since care began at this facility? NA - initial visit  In general, how is the patients overall health? Fair

## 2023-11-08 ENCOUNTER — Ambulatory Visit: Payer: Medicare Other | Admitting: Orthopaedic Surgery

## 2023-11-08 ENCOUNTER — Encounter: Payer: Self-pay | Admitting: Orthopaedic Surgery

## 2023-11-08 DIAGNOSIS — M542 Cervicalgia: Secondary | ICD-10-CM | POA: Diagnosis not present

## 2023-11-08 DIAGNOSIS — M25561 Pain in right knee: Secondary | ICD-10-CM

## 2023-11-08 DIAGNOSIS — G8929 Other chronic pain: Secondary | ICD-10-CM

## 2023-11-08 NOTE — Progress Notes (Signed)
The patient comes in for follow-up after having been to physical therapy for neck pain as well as intermittent right knee pain.  He has had a steroid injection in his right knee and that did help and therapy is continued to help with his neck and his knee.  He says the knee pain is sporadic and not all the time.  It does not wake him up at night.  He says his neck pain is getting better and he feels like it may have been stress related.  He is 67 years old and does have other medical issues.  On exam his neck is moving better as is his right knee.  There are no radicular symptoms of the bilateral upper extremities.  His knee shows no effusion today.  At this point follow-up can be as needed since he is doing better.  If things worsen in any way he will let us know.  He may be a candidate at some point for hyaluronic acid for the right knee.  His x-rays show well-maintained joint space with mild to moderate narrowing.

## 2023-11-11 ENCOUNTER — Encounter: Payer: Medicare Other | Admitting: Physical Therapy

## 2023-11-17 ENCOUNTER — Encounter: Payer: Medicare Other | Admitting: Physical Therapy
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
## 2024-01-24 ENCOUNTER — Ambulatory Visit: Payer: Medicare Other | Admitting: Neurology

## 2024-01-24 ENCOUNTER — Encounter: Payer: Self-pay | Admitting: Neurology
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
## 2024-02-02 ENCOUNTER — Encounter: Payer: Self-pay | Admitting: Neurology

## 2024-02-02 ENCOUNTER — Ambulatory Visit: Payer: Medicare Other | Admitting: Neurology

## 2024-02-02 VITALS — BP 109/60 | HR 86 | Resp 18 | Ht 71.0 in | Wt 206.0 lb

## 2024-02-02 DIAGNOSIS — G40309 Generalized idiopathic epilepsy and epileptic syndromes, not intractable, without status epilepticus: Secondary | ICD-10-CM | POA: Diagnosis not present

## 2024-02-02 DIAGNOSIS — M542 Cervicalgia: Secondary | ICD-10-CM | POA: Diagnosis not present

## 2024-02-02 MED ORDER — LEVETIRACETAM 500 MG PO TABS: 500.0000 mg | ORAL_TABLET | Freq: Two times a day (BID) | ORAL | 3 refills | Status: AC

## 2024-02-02 MED ORDER — GABAPENTIN 100 MG PO CAPS: 100.0000 mg | ORAL_CAPSULE | Freq: Every day | ORAL | 5 refills | Status: DC

## 2024-02-02 NOTE — Patient Instructions (Signed)
Continue Keppra 500mg  twice daily For neck pain, start gabapentin 100mg  at bedtime.  We can increase dose in 4 weeks if needed Follow up 6 months.

## 2024-02-07 ENCOUNTER — Telehealth: Payer: Self-pay

## 2024-02-07 ENCOUNTER — Other Ambulatory Visit: Payer: Self-pay

## 2024-02-07 DIAGNOSIS — B182 Chronic viral hepatitis C: Secondary | ICD-10-CM

## 2024-02-07 NOTE — Telephone Encounter (Signed)
-----   Message from Nurse Glendora Score C sent at 08/05/2023 11:15 AM EDT ----- Patient needs 6 month follow up US. Refer to imaging note 07/31/23. Order needs to be placed.

## 2024-02-07 NOTE — Telephone Encounter (Signed)
 Pt made aware of reminder that was received. Pt was scheduled for the [name] on 02/16/2024 at 9:30 AM at [facility]. Nothing to eat or drink past midnight. Pt made aware. Pt verbalized understanding with all questions answered.

## 2024-02-14 ENCOUNTER — Ambulatory Visit
Admission: RE | Admit: 2024-02-14 | Discharge: 2024-02-14 | Disposition: A | Discharge status: Home / Self Care | Payer: Medicare Other | Source: Ambulatory Visit | Attending: Pulmonary Disease

## 2024-02-14 DIAGNOSIS — I2510 Atherosclerotic heart disease of native coronary artery without angina pectoris: Secondary | ICD-10-CM | POA: Diagnosis not present

## 2024-02-14 DIAGNOSIS — J849 Interstitial pulmonary disease, unspecified: Secondary | ICD-10-CM

## 2024-02-14 DIAGNOSIS — I700 Atherosclerosis of aorta: Secondary | ICD-10-CM | POA: Diagnosis not present

## 2024-02-14 DIAGNOSIS — J8410 Pulmonary fibrosis, unspecified: Secondary | ICD-10-CM | POA: Diagnosis not present

## 2024-02-16 ENCOUNTER — Ambulatory Visit (HOSPITAL_COMMUNITY)
Admission: RE | Admit: 2024-02-16 | Discharge: 2024-02-16 | Disposition: A | Discharge status: Home / Self Care | Payer: Medicare Other | Source: Ambulatory Visit | Attending: Internal Medicine | Admitting: Internal Medicine

## 2024-02-16 DIAGNOSIS — K7460 Unspecified cirrhosis of liver: Secondary | ICD-10-CM | POA: Insufficient documentation

## 2024-02-16 DIAGNOSIS — B182 Chronic viral hepatitis C: Secondary | ICD-10-CM | POA: Diagnosis present

## 2024-02-16 DIAGNOSIS — R161 Splenomegaly, not elsewhere classified: Secondary | ICD-10-CM | POA: Diagnosis not present

## 2024-02-23 ENCOUNTER — Other Ambulatory Visit: Payer: Self-pay | Admitting: Internal Medicine

## 2024-02-23 DIAGNOSIS — B182 Chronic viral hepatitis C: Secondary | ICD-10-CM

## 2024-03-07 ENCOUNTER — Encounter: Payer: Self-pay | Admitting: Pulmonary Disease

## 2024-03-09 ENCOUNTER — Encounter: Payer: Medicare Other | Admitting: Orthopedic Surgery

## 2024-03-10 NOTE — Progress Notes (Signed)
 This encounter was created in error - please disregard.

## 2024-03-22 ENCOUNTER — Other Ambulatory Visit (INDEPENDENT_AMBULATORY_CARE_PROVIDER_SITE_OTHER)

## 2024-03-22 DIAGNOSIS — K7460 Unspecified cirrhosis of liver: Secondary | ICD-10-CM

## 2024-03-22 DIAGNOSIS — B182 Chronic viral hepatitis C: Secondary | ICD-10-CM

## 2024-03-22 LAB — COMPREHENSIVE METABOLIC PANEL WITH GFR
ALT: 13 U/L (ref 0–53)
AST: 22 U/L (ref 0–37)
Albumin: 4 g/dL (ref 3.5–5.2)
Alkaline Phosphatase: 91 U/L (ref 39–117)
BUN: 9 mg/dL (ref 6–23)
CO2: 25 meq/L (ref 19–32)
Calcium: 10.1 mg/dL (ref 8.4–10.5)
Chloride: 107 meq/L (ref 96–112)
Creatinine, Ser: 0.64 mg/dL (ref 0.40–1.50)
GFR: 97.59 mL/min (ref >60.00)
Glucose, Bld: 94 mg/dL (ref 70–99)
Potassium: 4.2 meq/L (ref 3.5–5.1)
Sodium: 138 meq/L (ref 135–145)
Total Bilirubin: 1.5 mg/dL — ABNORMAL HIGH (ref 0.2–1.2)
Total Protein: 7.4 g/dL (ref 6.0–8.3)

## 2024-03-22 LAB — CBC WITH DIFFERENTIAL/PLATELET
Basophils Absolute: 0 10*3/uL (ref 0.0–0.1)
Basophils Relative: 0.7 % (ref 0.0–3.0)
Eosinophils Absolute: 0.1 10*3/uL (ref 0.0–0.7)
Eosinophils Relative: 2.8 % (ref 0.0–5.0)
HCT: 40.2 % (ref 39.0–52.0)
Hemoglobin: 14.1 g/dL (ref 13.0–17.0)
Lymphocytes Relative: 26 % (ref 12.0–46.0)
Lymphs Abs: 0.9 10*3/uL (ref 0.7–4.0)
MCHC: 35 g/dL (ref 30.0–36.0)
MCV: 91.7 fl (ref 78.0–100.0)
Monocytes Absolute: 0.2 10*3/uL (ref 0.1–1.0)
Monocytes Relative: 7.3 % (ref 3.0–12.0)
Neutro Abs: 2.1 10*3/uL (ref 1.4–7.7)
Neutrophils Relative %: 63.2 % (ref 43.0–77.0)
Platelets: 66 10*3/uL — ABNORMAL LOW (ref 150.0–400.0)
RBC: 4.39 Mil/uL (ref 4.22–5.81)
RDW: 14.7 % (ref 11.5–15.5)
WBC: 3.3 10*3/uL — ABNORMAL LOW (ref 4.0–10.5)

## 2024-03-22 LAB — PROTIME-INR
INR: 1.3 ratio — ABNORMAL HIGH (ref 0.8–1.0)
Prothrombin Time: 13.6 s — ABNORMAL HIGH (ref 9.6–13.1)

## 2024-03-23 ENCOUNTER — Encounter: Payer: Self-pay | Admitting: Orthopedic Surgery

## 2024-03-23 ENCOUNTER — Ambulatory Visit: Admitting: Orthopedic Surgery

## 2024-03-23 ENCOUNTER — Encounter: Payer: Self-pay | Admitting: Internal Medicine

## 2024-03-23 VITALS — BP 98/60 | HR 60 | Temp 96.6°F | Resp 16 | Ht 71.0 in | Wt 206.0 lb

## 2024-03-23 DIAGNOSIS — M25561 Pain in right knee: Secondary | ICD-10-CM | POA: Diagnosis not present

## 2024-03-23 DIAGNOSIS — R569 Unspecified convulsions: Secondary | ICD-10-CM

## 2024-03-23 DIAGNOSIS — M542 Cervicalgia: Secondary | ICD-10-CM | POA: Diagnosis not present

## 2024-03-23 DIAGNOSIS — J84112 Idiopathic pulmonary fibrosis: Secondary | ICD-10-CM

## 2024-03-23 DIAGNOSIS — K7460 Unspecified cirrhosis of liver: Secondary | ICD-10-CM

## 2024-03-23 DIAGNOSIS — G8929 Other chronic pain: Secondary | ICD-10-CM

## 2024-03-23 DIAGNOSIS — B182 Chronic viral hepatitis C: Secondary | ICD-10-CM

## 2024-03-23 DIAGNOSIS — I700 Atherosclerosis of aorta: Secondary | ICD-10-CM | POA: Diagnosis not present

## 2024-03-23 LAB — AFP TUMOR MARKER: AFP-Tumor Marker: 3.1 ng/mL (ref <6.1)

## 2024-03-23 NOTE — Patient Instructions (Addendum)
 Schedule with pulmonary/ Dr. Isaiah Serge  Recommend getting Shingles vaccine at local pharmacy  Next visit please come fasting so we can check cholesterol  Try to quit smoking
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
## 2024-03-24 ENCOUNTER — Telehealth: Payer: Self-pay | Admitting: Orthopedic Surgery

## 2024-03-24 NOTE — Telephone Encounter (Signed)
 Discussed low SBP with patient via telephone. He denies recent dizziness or lightheadedness. He admits to only taking carvedilol 2 times weekly. Advised to purchase home blood pressure cuff and take blood pressure 2x/week. He will reports readings back to PCP.

## 2024-07-06 ENCOUNTER — Ambulatory Visit (INDEPENDENT_AMBULATORY_CARE_PROVIDER_SITE_OTHER): Admitting: Orthopedic Surgery

## 2024-07-06 VITALS — BP 106/60 | HR 58 | Temp 97.6°F | Resp 16 | Ht 71.0 in | Wt 200.0 lb

## 2024-07-06 DIAGNOSIS — N6342 Unspecified lump in left breast, subareolar: Secondary | ICD-10-CM | POA: Diagnosis not present

## 2024-07-06 DIAGNOSIS — R634 Abnormal weight loss: Secondary | ICD-10-CM | POA: Diagnosis not present

## 2024-07-06 NOTE — Progress Notes (Signed)
 Careteam: Patient Care Team: Gil Greig BRAVO, NP as PCP - General (Adult Health Nurse Practitioner) Skeet Juliene SAUNDERS, DO as Consulting Physician (Neurology)  Seen by: Greig Gil, AGNP-C  PLACE OF SERVICE:  [facility]  Advanced Directive information Does Patient Have a Medical Advance Directive?: Yes, Type of Advance Directive: Healthcare Power of [place];Living will;Out of facility DNR (pink MOST or yellow form), Does patient want to make changes to medical advance directive?: No - Patient declined  Allergies  Allergen Reactions   Other Shortness Of Breath    Israel Pig - Upper Respiratory     Chief Complaint  Patient presents with   Mass    Patient complains of lump on left chest area. Patient states that area has discomfort when touched. Patient states he noticed this lump last year however it has grown.      HPI: Patient is a 68 y.o. male seen today for acute visit due to left breast lump.   Discussed the use of AI scribe software for clinical note transcription with the patient, who gave verbal consent to proceed.  History of Present Illness   Francisco [name] is a 68 year old male who presents with a growing bump on his left chest.  He noticed a small bump on his left chest, behind the nipple, around December or January. Initially, it was almost unnoticeable and would 'float a little bit.' Over time, it has grown larger and is now fixed in place, causing concern. The bump is about the size of a dime and is sore to the touch but not causing excruciating pain. No skin breakdown or discharge from the area.  He has a history of cysts, particularly around his ears, which this bump reminds him of. He has not experienced similar growths on his chest before.  He walks at least a mile and a half daily and has been losing weight, noting a six-pound loss since his last check-up. He is aiming to reach a weight of 180-185 pounds, down from 240 pounds six years ago.  He has not yet  received the shingles vaccine, although he is up to date on the pneumonia vaccine and typically receives a flu shot in the fall. He recalls having chicken pox in the past.       Review of Systems:  Review of Systems  Constitutional:  Positive for weight loss.  HENT: Negative.    Respiratory: Negative.    Cardiovascular: Negative.   Skin:        Left breast lump  Psychiatric/Behavioral: Negative.      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 (HCC) and some component of alcohol use 07/19/2014   Hepatitis C    History of blood transfusion 1987   related to the zygomatic arch fracture   Iron deficiency anemia secondary to blood loss (chronic) 07/19/2014   Portal hypertensive gastropathy (HCC) 07/19/2014   Scoliosis    Seizures (  HCC)    Torn rotator cuff    Past Surgical History:  Procedure Laterality Date   ABDOMINAL SURGERY     Knife puncture to Stomach   COLONOSCOPY  2008   COLONOSCOPY N/A 07/19/2014   Procedure: COLONOSCOPY;  Surgeon: Lupita FORBES Commander, MD;  Location: [facility] ENDOSCOPY;  Service: Endoscopy;  Laterality: N/A;   COLONOSCOPY WITH PROPOFOL  N/A 06/19/2021   Procedure: COLONOSCOPY WITH PROPOFOL ;  Surgeon: Commander Lupita FORBES, MD;  Location: [facility] ENDOSCOPY;  Service: Endoscopy;  Laterality: N/A;   ESOPHAGEAL BANDING  04/14/2017   Procedure: ESOPHAGEAL BANDING;  Surgeon: Lupita FORBES Commander, MD;  Location: [facility] ENDOSCOPY;  Service: Endoscopy;;   ESOPHAGOGASTRODUODENOSCOPY N/A 07/19/2014   Procedure: ESOPHAGOGASTRODUODENOSCOPY (EGD);   Surgeon: Lupita FORBES Commander, MD;  Location: [facility] ENDOSCOPY;  Service: Endoscopy;  Laterality: N/A;   ESOPHAGOGASTRODUODENOSCOPY N/A 09/05/2014   Procedure: ESOPHAGOGASTRODUODENOSCOPY (EGD);  Surgeon: Lupita FORBES Commander, MD;  Location: [facility];  Service: Endoscopy;  Laterality: N/A;   ESOPHAGOGASTRODUODENOSCOPY N/A 10/17/2014   Procedure: ESOPHAGOGASTRODUODENOSCOPY (EGD);  Surgeon: Lupita FORBES Commander, MD;  Location: [facility];  Service: Endoscopy;  Laterality: N/A;   ESOPHAGOGASTRODUODENOSCOPY N/A 12/19/2014   Procedure: ESOPHAGOGASTRODUODENOSCOPY (EGD);  Surgeon: Lupita FORBES Commander, MD;  Location: [facility];  Service: Endoscopy;  Laterality: N/A;   ESOPHAGOGASTRODUODENOSCOPY N/A 03/16/2017   Procedure: ESOPHAGOGASTRODUODENOSCOPY (EGD);  Surgeon: Gwendlyn ONEIDA Buddy, MD;  Location: [facility] ENDOSCOPY;  Service: Endoscopy;  Laterality: N/A;   ESOPHAGOGASTRODUODENOSCOPY (EGD) WITH PROPOFOL  N/A 11/21/2015   Procedure: ESOPHAGOGASTRODUODENOSCOPY (EGD) WITH PROPOFOL ;  Surgeon: Lupita FORBES Commander, MD;  Location: [facility] ENDOSCOPY;  Service: Endoscopy;  Laterality: N/A;   ESOPHAGOGASTRODUODENOSCOPY (EGD) WITH PROPOFOL  N/A 04/14/2017   Procedure: ESOPHAGOGASTRODUODENOSCOPY (EGD) WITH PROPOFOL ;  Surgeon: Lupita FORBES Commander, MD;  Location: [facility]OSCOPY;  Service: Endoscopy;  Laterality: N/A;   ESOPHAGOGASTRODUODENOSCOPY (EGD) WITH PROPOFOL  N/A 07/23/2017   Procedure: ESOPHAGOGASTRODUODENOSCOPY (EGD) WITH PROPOFOL ;  Surgeon: Commander Lupita FORBES, MD;  Location: [facility] ENDOSCOPY;  Service: Endoscopy;  Laterality: N/A;   ESOPHAGOGASTRODUODENOSCOPY (EGD) WITH PROPOFOL  N/A 06/19/2021   Procedure: ESOPHAGOGASTRODUODENOSCOPY (EGD) WITH PROPOFOL ;  Surgeon: Commander Lupita FORBES, MD;  Location: [facility] ENDOSCOPY;  Service: Endoscopy;  Laterality: N/A;   ESOPHAGOGASTRODUODENOSCOPY (EGD) WITH PROPOFOL  N/A 10/22/2022   Procedure: ESOPHAGOGASTRODUODENOSCOPY (EGD) WITH PROPOFOL ;  Surgeon: Commander Lupita FORBES, MD;  Location: [facility] ENDOSCOPY;  Service: Gastroenterology;  Laterality: N/A;    FRACTURE SURGERY Right    cheek   GASTRIC VARICES BANDING N/A 10/17/2014   Procedure: GASTRIC VARICES BANDING;  Surgeon: Lupita FORBES Commander, MD;  Location: [facility] ENDOSCOPY;  Service: Endoscopy;  Laterality: N/A;   ORIF ZYGOMATIC FRACTURE Right 1987   RADIUS SYNOSTOSIS PROXIMAL RESECTION W/ ALLOGRAFT Right 1980s   at the elbow   THYROIDECTOMY, PARTIAL Left 2012   Social History:   reports that he has been smoking cigarettes. He started smoking about 50 years ago. He has a 23.9 pack-year smoking history. He has never used smokeless tobacco. He reports that he does not drink alcohol and does not use drugs.  Family History  Problem Relation Age of Onset   Breast cancer Mother 67   Crohn's disease Father 88   Arthritis/Rheumatoid Father    Hearing loss Father    Epilepsy Sister 17   Ankylosing spondylitis Brother 3   Heart disease Maternal Grandmother    Alcohol abuse Maternal Grandfather    Cancer Paternal Grandfather        type unknown, mets   Alcohol abuse Maternal Uncle        several   Depression  Daughter 34    Medications: Patient's Medications  New Prescriptions   No medications on file  Previous Medications   ACETAMINOPHEN  (TYLENOL ) 325 MG TABLET    Take 325 mg by mouth as needed.   CARVEDILOL  (COREG ) 3.125 MG TABLET    TAKE 1 TABLET BY MOUTH TWICE A DAY WITH A MEAL   FLUTICASONE-UMECLIDIN-VILANT (TRELEGY ELLIPTA ) 200-62.5-25 MCG/ACT AEPB    Inhale 1 puff into the lungs daily.   GABAPENTIN  (NEURONTIN ) 100 MG CAPSULE    Take 1 capsule (100 mg total) by mouth at bedtime.   LEVETIRACETAM  (KEPPRA ) 500 MG TABLET    Take 1 tablet (500 mg total) by mouth 2 (two) times daily.   MULTIPLE VITAMINS-MINERALS (MULTIVITAMIN WITH MINERALS) TABLET    Take 1 tablet by mouth daily.   PIRFENIDONE  (ESBRIET ) 801 MG TABS    Take 801 mg by mouth 3 (three) times daily.  Modified Medications   No medications on file  Discontinued Medications   No medications on file    Physical  Exam:  Vitals:   07/06/24 1314  BP: 106/60  Pulse: (!) 58  Resp: 16  Temp: 97.6 F (36.4 C)  SpO2: 93%  Weight: 200 lb (90.7 kg)  Height: 5' 11 (1.803 m)   Body mass index is 27.89 kg/m. Wt Readings from Last 3 Encounters:  07/06/24 200 lb (90.7 kg)  03/23/24 206 lb (93.4 kg)  02/02/24 206 lb (93.4 kg)    Physical Exam Vitals reviewed.  Constitutional:      General: He is not in acute distress. HENT:     Head: Normocephalic.  Eyes:     General:        Right eye: No discharge.        Left eye: No discharge.  Cardiovascular:     Rate and Rhythm: Normal rate and regular rhythm.     Pulses: Normal pulses.     Heart sounds: Normal heart sounds.  Pulmonary:     Effort: Pulmonary effort is normal.     Breath sounds: Normal breath sounds.  Chest:  Breasts:    Left: Mass and tenderness present. No inverted nipple, nipple discharge or skin change.     Comments: Approx 1-2 cm fixed nodule over areola, mild tenderness with touch, no skin discoloration  Abdominal:     Palpations: Abdomen is soft.  Musculoskeletal:        General: Normal range of motion.     Cervical back: Neck supple.  Skin:    General: Skin is warm.  Neurological:     General: No focal deficit present.     Mental Status: He is alert and oriented to person, place, and time.  Psychiatric:        Mood and Affect: Mood normal.     Labs reviewed: Basic Metabolic Panel: Recent Labs    09/06/23 0912 10/13/23 1341 03/22/24 1458  NA 139 137 138  K 4.0 3.8 4.2  CL 108 108 107  CO2 24 25 25   GLUCOSE 88 94 94  BUN 11 12 9   CREATININE 0.66* 0.68 0.64  CALCIUM 9.5 10.3 10.1   Liver Function Tests: Recent Labs    08/11/23 1155 09/06/23 0912 10/13/23 1341 03/22/24 1458  AST 27 23 25 22   ALT 21 17 18 13   ALKPHOS 96  --  88 91  BILITOT 0.9 0.6 1.3* 1.5*  PROT 7.3 7.1 7.5 7.4  ALBUMIN 4.0  --  4.0 4.0   No results for input(s): LIPASE,  AMYLASE in the last 8760 hours. No results for  input(s): AMMONIA in the last 8760 hours. CBC: Recent Labs    09/06/23 0912 10/13/23 1341 03/22/24 1458  WBC 3.6* 3.9* 3.3*  NEUTROABS 2,113 2.7 2.1  HGB 13.1* 14.0 14.1  HCT 38.2* 38.6* 40.2  MCV 89.9 88.1 91.7  PLT 69* 55* 66.0*   Lipid Panel: Recent Labs    09/06/23 0912  CHOL 140  HDL 55  LDLCALC 73  TRIG 50  CHOLHDL 2.5   TSH: No results for input(s): TSH in the last 8760 hours. A1C: No results found for: HGBA1C   Assessment/Plan 1. Subareolar mass of left breast (Primary) - began December 2024 - dime sized fixed nodule noted under areola, mild tenderness - ? Lipoma, cyst, malignancy, gynecomastia - US  LIMITED ULTRASOUND INCLUDING AXILLA LEFT BREAST ; Future  2. Weight loss - BMI 27.89, 200 lbs - weight 208 lbs 02/2023 - h/o liver cirrhosis> ATF tumor marker normal 03/2024  Total time: 31 minutes. Greater than 50% of total time spent doing patient education regarding health maintenance, left breast lump and weight loss including symptom/medication management.    Next appt: 09/28/2024  Greig Gil BODILY  [facility] [phone]

## 2024-07-10 ENCOUNTER — Other Ambulatory Visit: Payer: Self-pay | Admitting: Orthopedic Surgery

## 2024-07-10 DIAGNOSIS — N6342 Unspecified lump in left breast, subareolar: Secondary | ICD-10-CM

## 2024-07-11 ENCOUNTER — Telehealth: Payer: Self-pay

## 2024-07-11 DIAGNOSIS — B182 Chronic viral hepatitis C: Secondary | ICD-10-CM

## 2024-07-11 NOTE — Telephone Encounter (Signed)
 Patient contacted about best days prior to setting up his U/S. RUQ U/S set up for 07/25/2024 at [facility] for 11:00AM, arrive at 10:30AM. NPO midnight. Francisco [name] informed of date/time. He is aware to come for lab work as well. Orders in epic.

## 2024-07-11 NOTE — Telephone Encounter (Signed)
-----   Message from [facility] McDonald J sent at 03/24/2024  9:14 AM EDT ----- Set him up for a RUQ U/S in August 2025 dx: cirrhosis. Order labs as well to be done in August: AFP, CMET, INR, CBC/diff. Call him first and get good days to set it up.

## 2024-07-20 ENCOUNTER — Ambulatory Visit

## 2024-07-20 ENCOUNTER — Ambulatory Visit
Admission: RE | Admit: 2024-07-20 | Discharge: 2024-07-20 | Disposition: A | Discharge status: Home / Self Care | Source: Ambulatory Visit | Attending: Orthopedic Surgery | Admitting: Orthopedic Surgery

## 2024-07-20 DIAGNOSIS — N62 Hypertrophy of breast: Secondary | ICD-10-CM | POA: Diagnosis not present

## 2024-07-20 DIAGNOSIS — N644 Mastodynia: Secondary | ICD-10-CM | POA: Diagnosis not present

## 2024-07-20 DIAGNOSIS — R92343 Mammographic extreme density, bilateral breasts: Secondary | ICD-10-CM | POA: Diagnosis not present

## 2024-07-20 DIAGNOSIS — N6489 Other specified disorders of breast: Secondary | ICD-10-CM | POA: Diagnosis not present

## 2024-07-20 DIAGNOSIS — N6342 Unspecified lump in left breast, subareolar: Secondary | ICD-10-CM

## 2024-07-21 ENCOUNTER — Ambulatory Visit: Payer: Self-pay | Admitting: Orthopedic Surgery

## 2024-07-25 ENCOUNTER — Other Ambulatory Visit (INDEPENDENT_AMBULATORY_CARE_PROVIDER_SITE_OTHER)

## 2024-07-25 ENCOUNTER — Ambulatory Visit (HOSPITAL_COMMUNITY)
Admission: RE | Admit: 2024-07-25 | Discharge: 2024-07-25 | Disposition: A | Discharge status: Home / Self Care | Source: Ambulatory Visit | Attending: Internal Medicine | Admitting: Internal Medicine

## 2024-07-25 DIAGNOSIS — K7460 Unspecified cirrhosis of liver: Secondary | ICD-10-CM

## 2024-07-25 DIAGNOSIS — B182 Chronic viral hepatitis C: Secondary | ICD-10-CM

## 2024-07-25 LAB — CBC WITH DIFFERENTIAL/PLATELET
Basophils Absolute: 0 K/uL (ref 0.0–0.1)
Basophils Relative: 0.4 % (ref 0.0–3.0)
Eosinophils Absolute: 0.1 K/uL (ref 0.0–0.7)
Eosinophils Relative: 4.1 % (ref 0.0–5.0)
HCT: 39.1 % (ref 39.0–52.0)
Hemoglobin: 13.4 g/dL (ref 13.0–17.0)
Lymphocytes Relative: 27.6 % (ref 12.0–46.0)
Lymphs Abs: 0.8 K/uL (ref 0.7–4.0)
MCHC: 34.3 g/dL (ref 30.0–36.0)
MCV: 90 fl (ref 78.0–100.0)
Monocytes Absolute: 0.2 K/uL (ref 0.1–1.0)
Monocytes Relative: 6.6 % (ref 3.0–12.0)
Neutro Abs: 1.7 K/uL (ref 1.4–7.7)
Neutrophils Relative %: 61.3 % (ref 43.0–77.0)
Platelets: 63 K/uL — ABNORMAL LOW (ref 150.0–400.0)
RBC: 4.34 Mil/uL (ref 4.22–5.81)
RDW: 15 % (ref 11.5–15.5)
WBC: 2.8 K/uL — ABNORMAL LOW (ref 4.0–10.5)

## 2024-07-25 LAB — COMPREHENSIVE METABOLIC PANEL WITH GFR
ALT: 16 U/L (ref 0–53)
AST: 22 U/L (ref 0–37)
Albumin: 3.7 g/dL (ref 3.5–5.2)
Alkaline Phosphatase: 83 U/L (ref 39–117)
BUN: 11 mg/dL (ref 6–23)
CO2: 28 meq/L (ref 19–32)
Calcium: 9.7 mg/dL (ref 8.4–10.5)
Chloride: 108 meq/L (ref 96–112)
Creatinine, Ser: 0.63 mg/dL (ref 0.40–1.50)
GFR: 97.82 mL/min (ref >60.00)
Glucose, Bld: 85 mg/dL (ref 70–99)
Potassium: 3.9 meq/L (ref 3.5–5.1)
Sodium: 139 meq/L (ref 135–145)
Total Bilirubin: 0.8 mg/dL (ref 0.2–1.2)
Total Protein: 7.2 g/dL (ref 6.0–8.3)

## 2024-07-25 LAB — PROTIME-INR
INR: 1.4 ratio — ABNORMAL HIGH (ref 0.8–1.0)
Prothrombin Time: 14.8 s — ABNORMAL HIGH (ref 9.6–13.1)

## 2024-07-27 LAB — AFP TUMOR MARKER: AFP-Tumor Marker: 3 ng/mL (ref <6.1)

## 2024-07-30 ENCOUNTER — Ambulatory Visit: Payer: Self-pay | Admitting: Internal Medicine

## 2024-08-02 NOTE — Telephone Encounter (Signed)
 Patient has been scheduled for 10/23. Please advise, thank you

## 2024-09-28 ENCOUNTER — Ambulatory Visit: Admitting: Orthopedic Surgery

## 2024-10-01 ENCOUNTER — Other Ambulatory Visit: Payer: Self-pay | Admitting: Neurology

## 2024-10-12 ENCOUNTER — Encounter: Payer: Self-pay | Admitting: Orthopedic Surgery

## 2024-10-12 ENCOUNTER — Encounter: Payer: Self-pay | Admitting: Internal Medicine

## 2024-10-12 ENCOUNTER — Ambulatory Visit (INDEPENDENT_AMBULATORY_CARE_PROVIDER_SITE_OTHER): Admitting: Orthopedic Surgery

## 2024-10-12 ENCOUNTER — Ambulatory Visit: Admitting: Internal Medicine

## 2024-10-12 VITALS — BP 110/60 | HR 60 | Temp 98.5°F | Ht 71.0 in | Wt 190.4 lb

## 2024-10-12 VITALS — BP 102/60 | HR 66 | Ht 71.0 in | Wt 189.0 lb

## 2024-10-12 DIAGNOSIS — K766 Portal hypertension: Secondary | ICD-10-CM | POA: Diagnosis not present

## 2024-10-12 DIAGNOSIS — R569 Unspecified convulsions: Secondary | ICD-10-CM | POA: Diagnosis not present

## 2024-10-12 DIAGNOSIS — K3189 Other diseases of stomach and duodenum: Secondary | ICD-10-CM | POA: Diagnosis not present

## 2024-10-12 DIAGNOSIS — B182 Chronic viral hepatitis C: Secondary | ICD-10-CM | POA: Diagnosis not present

## 2024-10-12 DIAGNOSIS — I8510 Secondary esophageal varices without bleeding: Secondary | ICD-10-CM | POA: Diagnosis not present

## 2024-10-12 DIAGNOSIS — D492 Neoplasm of unspecified behavior of bone, soft tissue, and skin: Secondary | ICD-10-CM | POA: Diagnosis not present

## 2024-10-12 DIAGNOSIS — Z23 Encounter for immunization: Secondary | ICD-10-CM

## 2024-10-12 DIAGNOSIS — R161 Splenomegaly, not elsewhere classified: Secondary | ICD-10-CM

## 2024-10-12 DIAGNOSIS — I700 Atherosclerosis of aorta: Secondary | ICD-10-CM

## 2024-10-12 DIAGNOSIS — I959 Hypotension, unspecified: Secondary | ICD-10-CM

## 2024-10-12 DIAGNOSIS — R634 Abnormal weight loss: Secondary | ICD-10-CM

## 2024-10-12 DIAGNOSIS — I8511 Secondary esophageal varices with bleeding: Secondary | ICD-10-CM

## 2024-10-12 DIAGNOSIS — L989 Disorder of the skin and subcutaneous tissue, unspecified: Secondary | ICD-10-CM | POA: Diagnosis not present

## 2024-10-12 DIAGNOSIS — J84112 Idiopathic pulmonary fibrosis: Secondary | ICD-10-CM

## 2024-10-12 DIAGNOSIS — K7460 Unspecified cirrhosis of liver: Secondary | ICD-10-CM

## 2024-10-12 MED ORDER — CARVEDILOL 3.125 MG PO TABS: 3.1250 mg | ORAL_TABLET | Freq: Two times a day (BID) | ORAL | 3 refills | Status: AC

## 2024-10-12 NOTE — Progress Notes (Signed)
 Francisco [name] 68 y.o. March 07, 1956 [id number]  Assessment & Plan:   Encounter Diagnoses  Name Primary?   Hepatic cirrhosis due to chronic hepatitis C infection (HCC) Yes   Secondary esophageal varices with ? prior bleeding (HCC)    Portal hypertensive gastropathy (HCC)    Splenomegaly    Skin lesion of left leg        - Restart carvedilol  at low dose to reduce variceal bleeding risk. - Monitor blood pressure at home, send readings via MyChart.  Venous insufficiency skin changes in lower extremities with sclerotic lesion on left shin - see dermatologist for evaluation of left shin lesion.  US  and labs again 01/2025 RTC 1 year sooner prn         Subjective:  GI problem summary   #1-Cirrhosis from HCV/alcohol (seen here since 2015).  With history of variceal bleeding, status post banding.  Last EGD 2022 very small varices and portal gastropathy no bleeding problems for years.  Nadolol  stopped due to suspected side effects (headaches) HCV eradicated with Harvoni  (Dr. Efrain).  Transient hepatic encephalopathy possible 2018.  Hepatocellular carcinoma screening has been negative with ultrasound and AFP over the years. Liver biopsy 2011 1. Liver, needle/core biopsy, three 18-gauge :   CHRONIC ACTIVE HEPATITIS WITH ADVANCED FIBROSIS CHARACTERIZED BY,   MILD ACTIVITY, GRADE 2,   INCREASED FIBROSIS, STAGE 3/4   SLIGHTLY INCREASED IRON.      #2 personal history of colon polyps-2 adenomas 2015, no polyps June 2022 10-year recall   ---------------------------------------------------------------------------------------------------------------------------------  Chief Complaint: Follow-up cirrhosis  HPI Discussed the use of AI scribe software for clinical note transcription with the patient, who gave verbal consent to proceed.   Francisco [name] is a 68 year old male with cirrhosis due to hepatitis C who presents for a routine follow-up.  He is doing well overall with no  new issues. A recent ultrasound in August showed stable results with no liver lesions. Blood work, including liver chemistries and kidney function, was normal. His INR was 1.4, and his platelets were 63,000, which have been consistently low but stable. His alpha fetoprotein level was 3, which is low normal.  He previously took carvedilol , which was discontinued after discussion with his primary care provider. He had BP 98/60 and had experienced no side effects such as lightheadedness or dizziness. Blood pressure recent readings of 120/60 at home and 110/60 at his primary care visit. He monitors his blood pressure regularly at home.  He has an umbilical hernia that sometimes 'gets back and shrivels up' but has not been tender or hard. He reports swelling in his left leg and has been wearing compression socks. He has a lesion on his left shin that has become harder over the last six months and peeled off like a scab, causing concern. He plans to see dermatology.  No sleeping difficulties or confusion. He mentions having gone to therapy for a couple of months after his daughter moved in, but now his living situation is stable.       US  07/2024 Cirrhosis no lesions   Lab Results  Component Value Date   ALT 16 07/25/2024   AST 22 07/25/2024   ALKPHOS 83 07/25/2024   BILITOT 0.8 07/25/2024   Lab Results  Component Value Date   NA 139 07/25/2024   CL 108 07/25/2024   K 3.9 07/25/2024   CO2 28 07/25/2024   BUN 11 07/25/2024   CREATININE 0.63 07/25/2024   GFR 97.82 07/25/2024  CALCIUM 9.7 07/25/2024   ALBUMIN 3.7 07/25/2024   GLUCOSE 85 07/25/2024   Lab Results  Component Value Date   WBC 2.8 (L) 07/25/2024   HGB 13.4 07/25/2024   HCT 39.1 07/25/2024   MCV 90.0 07/25/2024   PLT 63.0 (L) 07/25/2024   Lab Results  Component Value Date   INR 1.4 (H) 07/25/2024   INR 1.3 (H) 03/22/2024   INR 1.3 (H) 10/13/2023    Allergies  Allergen Reactions   Other Shortness Of Breath     Israel Pig - Upper Respiratory    Current Meds  Medication Sig   acetaminophen  (TYLENOL ) 325 MG tablet Take 325 mg by mouth as needed.   carvedilol  (COREG ) 3.125 MG tablet Take 1 tablet (3.125 mg total) by mouth 2 (two) times daily with a meal.   levETIRAcetam  (KEPPRA ) 500 MG tablet Take 1 tablet (500 mg total) by mouth 2 (two) times daily.   Multiple Vitamins-Minerals (MULTIVITAMIN WITH MINERALS) tablet Take 1 tablet by mouth daily.   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 (HCC) and some component of alcohol use 07/19/2014   Hepatitis C    History of blood transfusion 1987   related to the zygomatic arch fracture   Iron deficiency anemia secondary to blood loss (chronic) 07/19/2014   Portal hypertensive gastropathy (HCC) 07/19/2014   Scoliosis    Seizures (HCC)    Torn rotator cuff    Past Surgical History:  Procedure Laterality Date   ABDOMINAL SURGERY     Knife puncture to Stomach   COLONOSCOPY  2008   COLONOSCOPY N/A 07/19/2014   Procedure: COLONOSCOPY;  Surgeon: Lupita FORBES Commander, MD;  Location: [facility] ENDOSCOPY;  Service: Endoscopy;  Laterality: N/A;   COLONOSCOPY WITH PROPOFOL  N/A 06/19/2021   Procedure: COLONOSCOPY WITH PROPOFOL ;  Surgeon: Commander Lupita FORBES, MD;  Location: [facility] ENDOSCOPY;  Service: Endoscopy;  Laterality: N/A;   ESOPHAGEAL BANDING  04/14/2017   Procedure: ESOPHAGEAL BANDING;  Surgeon: Lupita FORBES Commander, MD;  Location: [facility] ENDOSCOPY;  Service: Endoscopy;;   ESOPHAGOGASTRODUODENOSCOPY N/A 07/19/2014   Procedure:  ESOPHAGOGASTRODUODENOSCOPY (EGD);  Surgeon: Lupita FORBES Commander, MD;  Location: [facility] ENDOSCOPY;  Service: Endoscopy;  Laterality: N/A;   ESOPHAGOGASTRODUODENOSCOPY N/A 09/05/2014   Procedure: ESOPHAGOGASTRODUODENOSCOPY (EGD);  Surgeon: Lupita FORBES Commander, MD;  Location: [facility];  Service: Endoscopy;  Laterality: N/A;   ESOPHAGOGASTRODUODENOSCOPY N/A 10/17/2014   Procedure: ESOPHAGOGASTRODUODENOSCOPY (EGD);  Surgeon: Lupita FORBES Commander, MD;  Location: [facility];  Service: Endoscopy;  Laterality: N/A;   ESOPHAGOGASTRODUODENOSCOPY N/A 12/19/2014   Procedure: ESOPHAGOGASTRODUODENOSCOPY (EGD);  Surgeon: Lupita FORBES Commander, MD;  Location: [facility];  Service: Endoscopy;  Laterality: N/A;   ESOPHAGOGASTRODUODENOSCOPY N/A 03/16/2017   Procedure: ESOPHAGOGASTRODUODENOSCOPY (EGD);  Surgeon: Gwendlyn ONEIDA Buddy, MD;  Location: [facility] ENDOSCOPY;  Service: Endoscopy;  Laterality: N/A;   ESOPHAGOGASTRODUODENOSCOPY (EGD) WITH PROPOFOL  N/A 11/21/2015   Procedure: ESOPHAGOGASTRODUODENOSCOPY (EGD) WITH PROPOFOL ;  Surgeon: Lupita FORBES Commander, MD;  Location: [facility] ENDOSCOPY;  Service: Endoscopy;  Laterality: N/A;   ESOPHAGOGASTRODUODENOSCOPY (EGD) WITH PROPOFOL  N/A 04/14/2017   Procedure: ESOPHAGOGASTRODUODENOSCOPY (EGD) WITH PROPOFOL ;  Surgeon: Lupita FORBES Commander, MD;  Location: [facility] ENDOSCOPY;  Service: Endoscopy;  Laterality: N/A;   ESOPHAGOGASTRODUODENOSCOPY (EGD) WITH PROPOFOL  N/A 07/23/2017   Procedure: ESOPHAGOGASTRODUODENOSCOPY (EGD) WITH PROPOFOL ;  Surgeon: Commander Lupita FORBES, MD;  Location: [facility] ENDOSCOPY;  Service: Endoscopy;  Laterality: N/A;   ESOPHAGOGASTRODUODENOSCOPY (EGD) WITH PROPOFOL  N/A 06/19/2021   Procedure: ESOPHAGOGASTRODUODENOSCOPY (EGD) WITH PROPOFOL ;  Surgeon: Commander Lupita FORBES, MD;  Location: [facility] ENDOSCOPY;  Service: Endoscopy;  Laterality: N/A;   ESOPHAGOGASTRODUODENOSCOPY (EGD) WITH PROPOFOL  N/A 10/22/2022   Procedure: ESOPHAGOGASTRODUODENOSCOPY (EGD) WITH PROPOFOL ;  Surgeon: Commander Lupita FORBES, MD;  Location: [facility] ENDOSCOPY;  Service:  Gastroenterology;  Laterality: N/A;   FRACTURE SURGERY Right    cheek   GASTRIC VARICES BANDING N/A 10/17/2014   Procedure: GASTRIC VARICES BANDING;  Surgeon: Lupita FORBES Commander, MD;  Location: [facility] ENDOSCOPY;  Service: Endoscopy;  Laterality: N/A;   ORIF ZYGOMATIC FRACTURE Right 1987   RADIUS SYNOSTOSIS PROXIMAL RESECTION W/ ALLOGRAFT Right 1980s   at the elbow   THYROIDECTOMY, PARTIAL Left 2012   Social History   Social History Narrative   Divorced, one daughter   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 Lives with his dog in a 1 story home Father and his dog moved 2020.    Right handed   One story home         Marital status:   Divorced                            What year were you married ? 1990      Highest Level of education completed: 2/1/2 Years college      Current or past profession: Art therapist       Do you exercise?   Yes                           Type & how often Walk every day  ( 1/2-2 miles) [employer] work      ADVANCED DIRECTIVES (Please bring copies)      Do you have a living will? Yes      Do you have a DNR form?   Yes                    If not, do you want to discuss one?       Do you have signed POA?HPOA forms? Yes                If so, please bring to your appointment      FUNCTIONAL STATUS- To be completed by Spouse / child / Staff       Do you have difficulty bathing or dressing yourself ?  No      Do you have difficulty preparing food or eating ?  No      Do you have difficulty managing your mediation ?  No      Do you have difficulty managing your finances ?  No      Do you have difficulty affording your medication ?  No      family history includes Alcohol abuse in his maternal grandfather and maternal uncle; Ankylosing spondylitis (age  of onset: 59) in his brother; Arthritis/Rheumatoid in his father; Breast cancer (age of  onset: 35) in his mother; Cancer in his paternal grandfather; Crohn's disease (age of onset: 72) in his father; Depression (age of onset: 86) in his daughter; Epilepsy (age of onset: 33) in his sister; Hearing loss in his father; Heart disease in his maternal grandmother.   Review of Systems As per HPI  Objective:   Physical Exam BP 102/60 (BP Location: Left Arm, Patient Position: Sitting, Cuff Size: Normal)   Pulse 66   Ht 5' 11 (1.803 m)   Wt 189 lb (85.7 kg)   BMI 26.36 kg/m  NAD Anicteric Lungs CTA Cor NL S1s2 no rmg Abd soft NT + spleen tip, small soft reducible umbilical hernia, no hepatomegaly Ext tr RLe edema and 1 + LLE edema w/ hyperpigmented macules and one half-dollar sized scaly lesion mid shin Spider angiomata Alert and oriented x 3  Data reviewed as per HPI in summary plus have reviewed primary care notes

## 2024-10-12 NOTE — Patient Instructions (Addendum)
 [phone]  Consider establishing with [facility]   See if Dr. Onesimo can draw lipid panel (come fasting) and thyroid  level> if not> schedule lab visit at our office

## 2024-10-12 NOTE — Patient Instructions (Addendum)
 We have sent the following medications to your pharmacy for you to pick up at your convenience: Carvedilol   Check your Blood pressure over the next week or so and send them to us .   Great to see you today!   I appreciate the opportunity to care for you. Lupita Commander, MD, [facility]

## 2024-10-12 NOTE — Progress Notes (Signed)
 Careteam: Patient Care Team: Gil Greig BRAVO, NP as PCP - General (Adult Health Nurse Practitioner) Skeet Juliene SAUNDERS, DO as Consulting Physician (Neurology)  Seen by: Greig Gil, AGNP-C  PLACE OF SERVICE:  [facility]  Advanced Directive information    Allergies  Allergen Reactions   Other Shortness Of Breath    Israel Pig - Upper Respiratory     Chief Complaint  Patient presents with   Follow-up    6 months follow up     HPI: Patient is a 68 y.o. male seen today for medical management of chronic conditions.   Discussed the use of AI scribe software for clinical note transcription with the patient, who gave verbal consent to proceed.  History of Present Illness    He has a patch on his shin that has developed over the past six months, becoming more pronounced recently. He does not have a dermatologist for evaluation. No pain. Area has slowly gotten bigger   He has a history of elevated blood pressure readings, with a recent measurement of 90/60 mmHg, although his home readings are typically around 120/50 mmHg. No dizziness or lightheadedness is reported.   He has a history of pulmonary fibrosis but is not currently using inhaler Trelegy. He walks over two miles a day and feels his breathing is good. He has not had recent pulmonary appointments due to scheduling difficulties.   He has experienced significant weight loss over the past ten years, losing about fifty pounds, which he attributes to eating less, eating healthier, and being more active.  He is currently taking Keppra  for seizures and has stopped using gabapentin , which was previously prescribed to help with sleep.         Review of Systems:  Review of Systems  Constitutional:  Positive for weight loss.  HENT: Negative.    Respiratory:  Positive for cough and shortness of breath. Negative for wheezing.   Cardiovascular: Negative.   Gastrointestinal: Negative.   Genitourinary: Negative.   Musculoskeletal:  Negative.   Skin: Negative.   Neurological: Negative.   Endo/Heme/Allergies: Negative.   Psychiatric/Behavioral: Negative.      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 (HCC) and some component of alcohol use 07/19/2014   Hepatitis C    History of blood transfusion 1987   related to the zygomatic arch fracture   Iron deficiency anemia secondary to blood loss (chronic) 07/19/2014   Portal hypertensive gastropathy (HCC) 07/19/2014   Scoliosis    Seizures (HCC)    Torn rotator cuff    Past Surgical History:  Procedure Laterality Date   ABDOMINAL SURGERY     Knife puncture to Stomach   COLONOSCOPY  2008   COLONOSCOPY N/A 07/19/2014   Procedure: COLONOSCOPY;  Surgeon: Lupita BRAVO Commander, MD;  Location: [facility] ENDOSCOPY;  Service: Endoscopy;  Laterality: N/A;   COLONOSCOPY WITH PROPOFOL  N/A 06/19/2021   Procedure: COLONOSCOPY WITH PROPOFOL ;  Surgeon: Commander Lupita  E, MD;  Location: [facility] ENDOSCOPY;  Service: Endoscopy;  Laterality: N/A;   ESOPHAGEAL BANDING  04/14/2017   Procedure: ESOPHAGEAL BANDING;  Surgeon: Lupita FORBES Commander, MD;  Location: [facility] ENDOSCOPY;  Service: Endoscopy;;   ESOPHAGOGASTRODUODENOSCOPY N/A 07/19/2014   Procedure: ESOPHAGOGASTRODUODENOSCOPY (EGD);  Surgeon: Lupita FORBES Commander, MD;  Location: [facility] ENDOSCOPY;  Service: Endoscopy;  Laterality: N/A;   ESOPHAGOGASTRODUODENOSCOPY N/A 09/05/2014   Procedure: ESOPHAGOGASTRODUODENOSCOPY (EGD);  Surgeon: Lupita FORBES Commander, MD;  Location: [facility];  Service: Endoscopy;  Laterality: N/A;    ESOPHAGOGASTRODUODENOSCOPY N/A 10/17/2014   Procedure: ESOPHAGOGASTRODUODENOSCOPY (EGD);  Surgeon: Lupita FORBES Commander, MD;  Location: [facility];  Service: Endoscopy;  Laterality: N/A;   ESOPHAGOGASTRODUODENOSCOPY N/A 12/19/2014   Procedure: ESOPHAGOGASTRODUODENOSCOPY (EGD);  Surgeon: Lupita FORBES Commander, MD;  Location: [facility];  Service: Endoscopy;  Laterality: N/A;   ESOPHAGOGASTRODUODENOSCOPY N/A 03/16/2017   Procedure: ESOPHAGOGASTRODUODENOSCOPY (EGD);  Surgeon: Gwendlyn ONEIDA Buddy, MD;  Location: [facility] ENDOSCOPY;  Service: Endoscopy;  Laterality: N/A;   ESOPHAGOGASTRODUODENOSCOPY (EGD) WITH PROPOFOL  N/A 11/21/2015   Procedure: ESOPHAGOGASTRODUODENOSCOPY (EGD) WITH PROPOFOL ;  Surgeon: Lupita FORBES Commander, MD;  Location: [facility] ENDOSCOPY;  Service: Endoscopy;  Laterality: N/A;   ESOPHAGOGASTRODUODENOSCOPY (EGD) WITH PROPOFOL  N/A 04/14/2017   Procedure: ESOPHAGOGASTRODUODENOSCOPY (EGD) WITH PROPOFOL ;  Surgeon: Lupita FORBES Commander, MD;  Location: [facility] ENDOSCOPY;  Service: Endoscopy;  Laterality: N/A;   ESOPHAGOGASTRODUODENOSCOPY (EGD) WITH PROPOFOL  N/A 07/23/2017   Procedure: ESOPHAGOGASTRODUODENOSCOPY (EGD) WITH PROPOFOL ;  Surgeon: Commander Lupita FORBES, MD;  Location: [facility] ENDOSCOPY;  Service: Endoscopy;  Laterality: N/A;   ESOPHAGOGASTRODUODENOSCOPY (EGD) WITH PROPOFOL  N/A 06/19/2021   Procedure: ESOPHAGOGASTRODUODENOSCOPY (EGD) WITH PROPOFOL ;  Surgeon: Commander Lupita FORBES, MD;  Location: [facility] ENDOSCOPY;  Service: Endoscopy;  Laterality: N/A;   ESOPHAGOGASTRODUODENOSCOPY (EGD) WITH PROPOFOL  N/A 10/22/2022   Procedure: ESOPHAGOGASTRODUODENOSCOPY (EGD) WITH PROPOFOL ;  Surgeon: Commander Lupita FORBES, MD;  Location: [facility] ENDOSCOPY;  Service: Gastroenterology;  Laterality: N/A;   FRACTURE SURGERY Right    cheek   GASTRIC VARICES BANDING N/A 10/17/2014   Procedure: GASTRIC VARICES BANDING;  Surgeon: Lupita FORBES Commander, MD;  Location: [facility] ENDOSCOPY;  Service: Endoscopy;  Laterality: N/A;   ORIF ZYGOMATIC FRACTURE Right 1987   RADIUS SYNOSTOSIS PROXIMAL  RESECTION W/ ALLOGRAFT Right 1980s   at the elbow   THYROIDECTOMY, PARTIAL Left 2012   Social History:   reports that he has been smoking cigarettes. He started smoking about 50 years ago. He has a 23.9 pack-year smoking history. He has never used smokeless tobacco. He reports that he does not drink alcohol and does not use drugs.  Family History  Problem Relation Age of Onset   Breast cancer Mother 45   Crohn's disease Father 54   Arthritis/Rheumatoid Father    Hearing loss Father    Epilepsy Sister 37   Ankylosing spondylitis Brother 23   Heart disease Maternal Grandmother    Alcohol abuse Maternal Grandfather    Cancer Paternal Grandfather        type unknown, mets   Alcohol abuse Maternal Uncle        several   Depression Daughter 20    Medications: Patient's Medications  New Prescriptions   No medications on file  Previous Medications   ACETAMINOPHEN  (TYLENOL ) 325 MG TABLET    Take 325 mg by mouth as needed.   FLUTICASONE-UMECLIDIN-VILANT (TRELEGY ELLIPTA ) 200-62.5-25 MCG/ACT AEPB    Inhale 1 puff into the lungs daily.   GABAPENTIN  (NEURONTIN ) 100 MG CAPSULE    Take 1 capsule (100 mg total) by mouth at bedtime.  LEVETIRACETAM  (KEPPRA ) 500 MG TABLET    Take 1 tablet (500 mg total) by mouth 2 (two) times daily.   MULTIPLE VITAMINS-MINERALS (MULTIVITAMIN WITH MINERALS) TABLET    Take 1 tablet by mouth daily.  Modified Medications   No medications on file  Discontinued Medications   No medications on file    Physical Exam:  Vitals:   10/12/24 0953  BP: 90/60  Pulse: 60  Temp: 98.5 F (36.9 C)  SpO2: 98%  Weight: 190 lb 6.4 oz (86.4 kg)  Height: 5' 11 (1.803 m)   Body mass index is 26.56 kg/m. Wt Readings from Last 3 Encounters:  10/12/24 190 lb 6.4 oz (86.4 kg)  07/06/24 200 lb (90.7 kg)  03/23/24 206 lb (93.4 kg)    Physical Exam Vitals reviewed.  Constitutional:      General: He is not in acute distress. HENT:     Head: Normocephalic.  Eyes:      General:        Right eye: No discharge.        Left eye: No discharge.  Cardiovascular:     Rate and Rhythm: Normal rate and regular rhythm.     Pulses: Normal pulses.     Heart sounds: Normal heart sounds.  Pulmonary:     Effort: Pulmonary effort is normal.     Breath sounds: Normal breath sounds.  Abdominal:     General: Bowel sounds are normal. There is no distension.     Palpations: Abdomen is soft.     Tenderness: There is no abdominal tenderness.  Musculoskeletal:     Cervical back: Neck supple.     Right lower leg: No edema.     Left lower leg: No edema.  Skin:    General: Skin is warm.     Capillary Refill: Capillary refill takes less than 2 seconds.     Comments: Clubbing to hands  Neurological:     General: No focal deficit present.     Mental Status: He is alert and oriented to person, place, and time.  Psychiatric:        Mood and Affect: Mood normal.     Labs reviewed: Basic Metabolic Panel: Recent Labs    10/13/23 1341 03/22/24 1458 07/25/24 1120  NA 137 138 139  K 3.8 4.2 3.9  CL 108 107 108  CO2 25 25 28   GLUCOSE 94 94 85  BUN 12 9 11   CREATININE 0.68 0.64 0.63  CALCIUM 10.3 10.1 9.7   Liver Function Tests: Recent Labs    10/13/23 1341 03/22/24 1458 07/25/24 1120  AST 25 22 22   ALT 18 13 16   ALKPHOS 88 91 83  BILITOT 1.3* 1.5* 0.8  PROT 7.5 7.4 7.2  ALBUMIN 4.0 4.0 3.7   No results for input(s): LIPASE, AMYLASE in the last 8760 hours. No results for input(s): AMMONIA in the last 8760 hours. CBC: Recent Labs    10/13/23 1341 03/22/24 1458 07/25/24 1120  WBC 3.9* 3.3* 2.8*  NEUTROABS 2.7 2.1 1.7  HGB 14.0 14.1 13.4  HCT 38.6* 40.2 39.1  MCV 88.1 91.7 90.0  PLT 55* 66.0* 63.0*   Lipid Panel: No results for input(s): CHOL, HDL, LDLCALC, TRIG, CHOLHDL, LDLDIRECT in the last 8760 hours. TSH: No results for input(s): TSH in the last 8760 hours. A1C: No results found for: HGBA1C   Assessment/Plan 1.  Immunization due (Primary) - Flu vaccine HIGH DOSE PF(Fluzone Trivalent)  2. Weight loss - weight down about 15  lbs - admits to being more physically active and healthier eating  - TSH; Future  3. Portal hypertension (HCC) - followed by Dr. Avram  4. Secondary esophageal varices with bleeding (HCC) - followed by Dr. Avram - h/o banding in past - unable to tolerate beta blockers in past  5. Idiopathic pulmonary fibrosis (HCC) - followed by pulmonary - stopped Trelegy on own - admits to some sob and cough   6. Aortic atherosclerosis - LDL 73 08/2023 - Lipid Panel; Future  7. Hypotension, unspecified hypotension type - initial BP 90/60, rechecked normal  - ? Blood loss> h/o esophageal varices  8. Skin neoplasm - recommend dermatology referral> [place]   9. Seizures (HCC) - followed by neurology - no recent episodes - cont Keppra   Total time: 36 minutes. Greater than 50% of total time spent doing patient education regarding health maintenance, low bp, weight loss, PF, esophageal varices and skin neoplasm including symptom/medication management.     Next appt: Visit date not found  Dannell Gortney Gil BODILY  [facility] [phone]

## 2024-10-16 ENCOUNTER — Other Ambulatory Visit: Payer: Medicare Other

## 2024-10-16 ENCOUNTER — Ambulatory Visit: Payer: Medicare Other | Admitting: Hematology

## 2024-10-17 ENCOUNTER — Other Ambulatory Visit: Payer: Self-pay

## 2024-10-17 DIAGNOSIS — D696 Thrombocytopenia, unspecified: Secondary | ICD-10-CM

## 2024-10-18 ENCOUNTER — Inpatient Hospital Stay (HOSPITAL_BASED_OUTPATIENT_CLINIC_OR_DEPARTMENT_OTHER): Admitting: Hematology

## 2024-10-18 ENCOUNTER — Inpatient Hospital Stay: Attending: Hematology

## 2024-10-18 VITALS — BP 114/61 | HR 56 | Temp 97.7°F | Resp 18 | Wt 195.1 lb

## 2024-10-18 DIAGNOSIS — D696 Thrombocytopenia, unspecified: Secondary | ICD-10-CM | POA: Diagnosis present

## 2024-10-18 DIAGNOSIS — Z822 Family history of deafness and hearing loss: Secondary | ICD-10-CM | POA: Insufficient documentation

## 2024-10-18 DIAGNOSIS — K7469 Other cirrhosis of liver: Secondary | ICD-10-CM | POA: Insufficient documentation

## 2024-10-18 DIAGNOSIS — Z79899 Other long term (current) drug therapy: Secondary | ICD-10-CM | POA: Diagnosis not present

## 2024-10-18 DIAGNOSIS — Z803 Family history of malignant neoplasm of breast: Secondary | ICD-10-CM | POA: Insufficient documentation

## 2024-10-18 DIAGNOSIS — F17210 Nicotine dependence, cigarettes, uncomplicated: Secondary | ICD-10-CM | POA: Diagnosis not present

## 2024-10-18 DIAGNOSIS — Z8379 Family history of other diseases of the digestive system: Secondary | ICD-10-CM | POA: Insufficient documentation

## 2024-10-18 DIAGNOSIS — Z8261 Family history of arthritis: Secondary | ICD-10-CM | POA: Diagnosis not present

## 2024-10-18 DIAGNOSIS — Z818 Family history of other mental and behavioral disorders: Secondary | ICD-10-CM | POA: Diagnosis not present

## 2024-10-18 DIAGNOSIS — Z811 Family history of alcohol abuse and dependence: Secondary | ICD-10-CM | POA: Insufficient documentation

## 2024-10-18 DIAGNOSIS — Z809 Family history of malignant neoplasm, unspecified: Secondary | ICD-10-CM | POA: Insufficient documentation

## 2024-10-18 LAB — CBC WITH DIFFERENTIAL (CANCER CENTER ONLY)
Abs Immature Granulocytes: 0.01 K/uL (ref 0.00–0.07)
Basophils Absolute: 0 K/uL (ref 0.0–0.1)
Basophils Relative: 0 %
Eosinophils Absolute: 0.1 K/uL (ref 0.0–0.5)
Eosinophils Relative: 4 %
HCT: 37.1 % — ABNORMAL LOW (ref 39.0–52.0)
Hemoglobin: 12.9 g/dL — ABNORMAL LOW (ref 13.0–17.0)
Immature Granulocytes: 0 %
Lymphocytes Relative: 22 %
Lymphs Abs: 0.7 K/uL (ref 0.7–4.0)
MCH: 31.1 pg (ref 26.0–34.0)
MCHC: 34.8 g/dL (ref 30.0–36.0)
MCV: 89.4 fL (ref 80.0–100.0)
Monocytes Absolute: 0.3 K/uL (ref 0.1–1.0)
Monocytes Relative: 9 %
Neutro Abs: 2 K/uL (ref 1.7–7.7)
Neutrophils Relative %: 65 %
Platelet Count: 60 K/uL — ABNORMAL LOW (ref 150–400)
RBC: 4.15 MIL/uL — ABNORMAL LOW (ref 4.22–5.81)
RDW: 14.2 % (ref 11.5–15.5)
WBC Count: 3.1 K/uL — ABNORMAL LOW (ref 4.0–10.5)
nRBC: 0 % (ref 0.0–0.2)

## 2024-10-18 LAB — CMP (CANCER CENTER ONLY)
ALT: 14 U/L (ref 0–44)
AST: 24 U/L (ref 15–41)
Albumin: 3.5 g/dL (ref 3.5–5.0)
Alkaline Phosphatase: 104 U/L (ref 38–126)
Anion gap: 3 — ABNORMAL LOW (ref 5–15)
BUN: 13 mg/dL (ref 8–23)
CO2: 27 mmol/L (ref 22–32)
Calcium: 9.7 mg/dL (ref 8.9–10.3)
Chloride: 108 mmol/L (ref 98–111)
Creatinine: 0.64 mg/dL (ref 0.61–1.24)
GFR, Estimated: >60 mL/min (ref >60)
Glucose, Bld: 100 mg/dL — ABNORMAL HIGH (ref 70–99)
Potassium: 4.6 mmol/L (ref 3.5–5.1)
Sodium: 138 mmol/L (ref 135–145)
Total Bilirubin: 1.2 mg/dL (ref 0.0–1.2)
Total Protein: 7.1 g/dL (ref 6.5–8.1)

## 2024-10-18 LAB — IMMATURE PLATELET FRACTION: Immature Platelet Fraction: 1.1 % — ABNORMAL LOW (ref 1.2–8.6)

## 2024-10-18 LAB — PROTIME-INR
INR: 1.2 (ref 0.8–1.2)
Prothrombin Time: 16.2 s — ABNORMAL HIGH (ref 11.4–15.2)

## 2024-10-18 NOTE — Progress Notes (Signed)
 HEMATOLOGY ONCOLOGY PROGRESS NOTE  Date of service: 10/18/2024  Patient Care Team: Gil Greig BRAVO, NP as PCP - General (Adult Health Nurse Practitioner) Skeet Juliene SAUNDERS, DO as Consulting Physician (Neurology)  CHIEF COMPLAINT/PURPOSE OF CONSULTATION: Follow-up for thrombocytopenia   HISTORY OF PRESENTING ILLNESS: Francisco [name] is a wonderful 68 y.o. male who has been referred to us  by Greig Gil, NP for evaluation and management of thrombocytopenia. The pt reports that he is doing well overall.   The pt reports that he has had low Plt counts since 2018. The pt notes that he sees Dr. Avram and has done four bandings and nine endoscopies total. The pt has a history of liver cirrhosis and denies any known history of clotting disorder. The pt notes that the Hepatitis C he had was due to a blood transfusion in the 1980s and was undetectable within 5 weeks of the 12 week treatment. The pt notes he quit alcohol in 2015 and they discovered the Hepatitis and cirrhosis in 2016. The pt notes that they believe the alcohol exacerbated the cirrhosis and played a role in this. The pt notes that he was getting ultrasounds of the liver, but has not gotten one recently due to the pandemic. The pt notes that he wishes to get another one sometime soon to observe the changes. The pt notes that he has been on Keppra  since Fall 2018. The pt notes that his mother and sister also have epilepsy. The pt notes he takes Advil once a month and Tylenol  3-4x a month, but denies needing them on a regular basis. The pt notes his diet and food intake has been very stable.   The pt notes no recent issues and was referred here due to a routine checkup. The pt note he did not have a PCP prior and this was a part of that visit. The pt notes that he lost 25 pounds within a span of 6 months after he retired. The pt notes that he believes he has been eating better, less stress, and more active.   Lab results 03/27/2021 of CBC w/diff  and CMP is as follows: all values are WNL except for WBC of 3.2K, Plt of 55K.   On review of systems, pt reports intermittent neck muscle pain and denies bleeding issues, decreased appetite, imbalanced diet, bloody stools, sudden weight changes, infection issues, cough, antibiotic usage, changes in bowel habits, and any other symptoms.   INTERVAL HISTORY: Francisco [name] is a 68 y.o. male who is here today for for thrombocytopenia.   he was last seen by me on 10/13/2023; at the time he mentioned experiencing a 20-LBS weight loss due to increased activity, had been started on Carvedilol , and reducing his cigarette usage to 2-3 cigarettes daily.   Today, he  ***      REVIEW OF SYSTEMS:    10 Point review of systems of done and is negative except as noted above.  MEDICAL HISTORY Past Medical History:  Diagnosis Date   Allergy to guinea pig dander    Arthritis    hands and knees the most; mild (03/15/2017)   Benign neoplasm of colon 07/19/2014   07/19/14 - 2 adenomas - repeat colonoscopy 2020    Cirrhosis (HCC)    Diverticulosis 2008   Epilepsy (HCC)    Esophageal varices with bleeding (HCC) 07/19/2014   Falls related to headaches    Fatty liver 2011   Abd US    Headaches thought due to old head injury  Hepatic cirrhosis due to chronic hepatitis C infection (HCC) and some component of alcohol use 07/19/2014   Hepatitis C    History of blood transfusion 1987   related to the zygomatic arch fracture   Iron deficiency anemia secondary to blood loss (chronic) 07/19/2014   Portal hypertensive gastropathy (HCC) 07/19/2014   Scoliosis    Seizures (HCC)    Torn rotator cuff     SURGICAL HISTORY Past Surgical History:  Procedure Laterality Date   ABDOMINAL SURGERY     Knife puncture to Stomach   COLONOSCOPY  2008   COLONOSCOPY N/A 07/19/2014   Procedure: COLONOSCOPY;  Surgeon: Lupita FORBES Commander, MD;  Location: [facility] ENDOSCOPY;  Service: Endoscopy;  Laterality: N/A;    COLONOSCOPY WITH PROPOFOL  N/A 06/19/2021   Procedure: COLONOSCOPY WITH PROPOFOL ;  Surgeon: Commander Lupita FORBES, MD;  Location: [facility] ENDOSCOPY;  Service: Endoscopy;  Laterality: N/A;   ESOPHAGEAL BANDING  04/14/2017   Procedure: ESOPHAGEAL BANDING;  Surgeon: Lupita FORBES Commander, MD;  Location: [facility] ENDOSCOPY;  Service: Endoscopy;;   ESOPHAGOGASTRODUODENOSCOPY N/A 07/19/2014   Procedure: ESOPHAGOGASTRODUODENOSCOPY (EGD);  Surgeon: Lupita FORBES Commander, MD;  Location: [facility] ENDOSCOPY;  Service: Endoscopy;  Laterality: N/A;   ESOPHAGOGASTRODUODENOSCOPY N/A 09/05/2014   Procedure: ESOPHAGOGASTRODUODENOSCOPY (EGD);  Surgeon: Lupita FORBES Commander, MD;  Location: [facility] ENDOSCOPY;  Service: Endoscopy;  Laterality: N/A;   ESOPHAGOGASTRODUODENOSCOPY N/A 10/17/2014   Procedure: ESOPHAGOGASTRODUODENOSCOPY (EGD);  Surgeon: Lupita FORBES Commander, MD;  Location: [facility] ENDOSCOPY;  Service: Endoscopy;  Laterality: N/A;   ESOPHAGOGASTRODUODENOSCOPY N/A 12/19/2014   Procedure: ESOPHAGOGASTRODUODENOSCOPY (EGD);  Surgeon: Lupita FORBES Commander, MD;  Location: [facility] ENDOSCOPY;  Service: Endoscopy;  Laterality: N/A;   ESOPHAGOGASTRODUODENOSCOPY N/A 03/16/2017   Procedure: ESOPHAGOGASTRODUODENOSCOPY (EGD);  Surgeon: Gwendlyn ONEIDA Buddy, MD;  Location: [facility] ENDOSCOPY;  Service: Endoscopy;  Laterality: N/A;   ESOPHAGOGASTRODUODENOSCOPY (EGD) WITH PROPOFOL  N/A 11/21/2015   Procedure: ESOPHAGOGASTRODUODENOSCOPY (EGD) WITH PROPOFOL ;  Surgeon: Lupita FORBES Commander, MD;  Location: [facility] ENDOSCOPY;  Service: Endoscopy;  Laterality: N/A;   ESOPHAGOGASTRODUODENOSCOPY (EGD) WITH PROPOFOL  N/A 04/14/2017   Procedure: ESOPHAGOGASTRODUODENOSCOPY (EGD) WITH PROPOFOL ;  Surgeon: Lupita FORBES Commander, MD;  Location: [facility] ENDOSCOPY;  Service: Endoscopy;  Laterality: N/A;   ESOPHAGOGASTRODUODENOSCOPY (EGD) WITH PROPOFOL  N/A 07/23/2017   Procedure: ESOPHAGOGASTRODUODENOSCOPY (EGD) WITH PROPOFOL ;  Surgeon: Commander Lupita FORBES, MD;  Location: [facility] ENDOSCOPY;  Service: Endoscopy;  Laterality: N/A;   ESOPHAGOGASTRODUODENOSCOPY (EGD)  WITH PROPOFOL  N/A 06/19/2021   Procedure: ESOPHAGOGASTRODUODENOSCOPY (EGD) WITH PROPOFOL ;  Surgeon: Commander Lupita FORBES, MD;  Location: [facility] ENDOSCOPY;  Service: Endoscopy;  Laterality: N/A;   ESOPHAGOGASTRODUODENOSCOPY (EGD) WITH PROPOFOL  N/A 10/22/2022   Procedure: ESOPHAGOGASTRODUODENOSCOPY (EGD) WITH PROPOFOL ;  Surgeon: Commander Lupita FORBES, MD;  Location: [facility] ENDOSCOPY;  Service: Gastroenterology;  Laterality: N/A;   FRACTURE SURGERY Right    cheek   GASTRIC VARICES BANDING N/A 10/17/2014   Procedure: GASTRIC VARICES BANDING;  Surgeon: Lupita FORBES Commander, MD;  Location: [facility] ENDOSCOPY;  Service: Endoscopy;  Laterality: N/A;   ORIF ZYGOMATIC FRACTURE Right 1987   RADIUS SYNOSTOSIS PROXIMAL RESECTION W/ ALLOGRAFT Right 1980s   at the elbow   THYROIDECTOMY, PARTIAL Left 2012    SOCIAL HISTORY Social History   Tobacco Use   Smoking status: Every Day    Current packs/day: 0.00    Average packs/day: 0.5 packs/day for 47.8 years (23.9 ttl pk-yrs)    Types: Cigarettes    Start date: 05/08/1974    Last attempt to quit: 03/04/2022    Years since quitting: 2.6   Smokeless tobacco: Never   Tobacco comments:  Smokes about 3 cigarettes per day//pap 08/11/23  Vaping Use   Vaping status: Never Used  Substance Use Topics   Alcohol use: No    Comment: 03/15/2017 used to drink considerably; quit 08/27/14   Drug use: No    Social History   Social History Narrative   Divorced, one daughter   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 Lives with his dog in a 1 story home Father and his dog moved 2020.    Right handed   One story home         Marital status:   Divorced                            What year were you married ? 1990      Highest Level of education completed: 2/1/2 Years college      Current or past profession: Art Therapist       Do you exercise?   Yes                            Type & how often Walk every day  ( 1/2-2 miles) [employer] work      ADVANCED DIRECTIVES (Please bring copies)      Do you have a living will? Yes      Do you have a DNR form?   Yes                    If not, do you want to discuss one?       Do you have signed POA?HPOA forms? Yes                If so, please bring to your appointment      FUNCTIONAL STATUS- To be completed by Spouse / child / Staff       Do you have difficulty bathing or dressing yourself ?  No      Do you have difficulty preparing food or eating ?  No      Do you have difficulty managing your mediation ?  No      Do you have difficulty managing your finances ?  No      Do you have difficulty affording your medication ?  No       SOCIAL DRIVERS OF HEALTH SDOH Screenings   Food Insecurity: No Food Insecurity (10/11/2024)  Housing: Low Risk  (10/11/2024)  Transportation Needs: No Transportation Needs (10/11/2024)  Utilities: Not At Risk (03/23/2024)  Alcohol Screen: Low Risk  (04/01/2023)  Depression (PHQ2-9): Low Risk  (10/12/2024)  Financial Resource Strain: Low Risk  (10/11/2024)  Physical Activity: Sufficiently Active (10/11/2024)  Social Connections: Socially Isolated (10/11/2024)  Stress: No Stress Concern Present (10/11/2024)  Tobacco Use: High Risk (10/12/2024)     FAMILY HISTORY Family History  Problem Relation Age of Onset   Breast cancer Mother 57   Crohn's disease Father 40   Arthritis/Rheumatoid Father    Hearing loss Father    Epilepsy Sister 60   Ankylosing spondylitis Brother 44   Heart disease Maternal Grandmother    Alcohol abuse Maternal Grandfather    Cancer Paternal Grandfather        type unknown, mets   Alcohol abuse Maternal Uncle  several   Depression Daughter 24     ALLERGIES: is allergic to other.  MEDICATIONS  Current Outpatient Medications  Medication Sig Dispense Refill   acetaminophen  (TYLENOL ) 325 MG tablet Take 325 mg by mouth as needed.      carvedilol  (COREG ) 3.125 MG tablet Take 1 tablet (3.125 mg total) by mouth 2 (two) times daily with a meal. 180 tablet 3   levETIRAcetam  (KEPPRA ) 500 MG tablet Take 1 tablet (500 mg total) by mouth 2 (two) times daily. 180 tablet 3   Multiple Vitamins-Minerals (MULTIVITAMIN WITH MINERALS) tablet Take 1 tablet by mouth daily.     No current facility-administered medications for this visit.    PHYSICAL EXAMINATION: ECOG PERFORMANCE STATUS: {CHL ONC ECOG ED:[id number]} VITALS: Vitals:   10/18/24 1340  BP: 114/61  Pulse: (!) 56  Resp: 18  Temp: 97.7 F (36.5 C)  SpO2: 97%   Filed Weights   10/18/24 1340  Weight: 195 lb 1.6 oz (88.5 kg)   Body mass index is 27.21 kg/m.  GENERAL: alert, in no acute distress and comfortable SKIN: no acute rashes, no significant lesions EYES: conjunctiva are pink and non-injected, sclera anicteric OROPHARYNX: MMM, no exudates, no oropharyngeal erythema or ulceration NECK: supple, no JVD LYMPH:  no palpable lymphadenopathy in the cervical, axillary or inguinal regions LUNGS: clear to auscultation b/l with normal respiratory effort HEART: regular rate & rhythm ABDOMEN:  normoactive bowel sounds , non tender, not distended, no hepatosplenomegaly Extremity: no pedal edema PSYCH: alert & oriented x 3 with fluent speech NEURO: no focal motor/sensory deficits  LABORATORY DATA:   I have reviewed the data as listed     Latest Ref Rng & Units 10/18/2024    1:03 PM 07/25/2024   11:20 AM 03/22/2024    2:58 PM  CBC EXTENDED  WBC 4.0 - 10.5 K/uL 3.1  2.8  3.3   RBC 4.22 - 5.81 MIL/uL 4.15  4.34  4.39   Hemoglobin 13.0 - 17.0 g/dL 87.0  86.5  85.8   HCT 39.0 - 52.0 % 37.1  39.1  40.2   Platelets 150 - 400 K/uL 60  63.0  66.0   NEUT# 1.7 - 7.7 K/uL 2.0  1.7  2.1   Lymph# 0.7 - 4.0 K/uL 0.7  0.8  0.9    PROTIME INR 10/18/2024  Prothrombin Time     11.4 - 15.2 seconds 16.2 (H)   INR     0.8 - 1.2  1.2     Immature Platelet Fraction     1.2 - 8.6 %  10/18/2024   1.1 (L)        Latest Ref Rng & Units 10/18/2024    1:03 PM 07/25/2024   11:20 AM 03/22/2024    2:58 PM  CMP  Glucose 70 - 99 mg/dL 899  85  94   BUN 8 - 23 mg/dL 13  11  9    Creatinine 0.61 - 1.24 mg/dL 9.35  9.36  9.35   Sodium 135 - 145 mmol/L 138  139  138   Potassium 3.5 - 5.1 mmol/L 4.6  3.9  4.2   Chloride 98 - 111 mmol/L 108  108  107   CO2 22 - 32 mmol/L 27  28  25    Calcium 8.9 - 10.3 mg/dL 9.7  9.7  89.8   Total Protein 6.5 - 8.1 g/dL 7.1  7.2  7.4   Total Bilirubin 0.0 - 1.2 mg/dL 1.2  0.8  1.5   Alkaline  Phos 38 - 126 U/L 104  83  91   AST 15 - 41 U/L 24  22  22    ALT 0 - 44 U/L 14  16  13      Component     Latest Ref Rng & Units 04/01/2021  Folate, Hemolysate     Not Estab. ng/mL 362.0  HCT     37.5 - 51.0 % 42.6  Folate, RBC     >498 ng/mL 850  Prothrombin Time     11.4 - 15.2 seconds 14.8  INR     0.8 - 1.2 1.2  Vitamin B12     180 - 914 pg/mL 221  Ferritin     24 - 336 ng/mL 129  Fibrinogen      210 - 475 mg/dL 642      RADIOGRAPHIC STUDIES: I have personally reviewed the radiological images as listed and agreed with the findings in the report.   US  abd 05/26/2021: IMPRESSION: Changes of cirrhosis of the liver without focal mass.   Cholelithiasis without complicating factors.   Changes consistent with splenomegaly.  ASSESSMENT & PLAN:  68 y.o. 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 Theophilus #5 history of generalized epilepsy/drop attacks following with Dr. Skeet and is currently on Keppra    PLAN:    PLAN: - Discussed lab results on 10/18/2024 in detail with patient: CBC showed WBC of 3.1K increased from 2.8K, Hemoglobin of 12.9 decreased from 13.4, and PLTs of 60K decreased from 63K CMP stable.  Protime-INR with Protime 16.2 and INR 1.2. Immature  Platelet Fraction at 1.1  - Continue regular follow-ups with specialists for management of other conditions  Dr. Theophilus and pulmonary for evaluation and management of idiopathic pulmonary fibrosis His neurologist, Dr. Juliene Skeet, for his primary generalized epilepsy/drop attacks Gastroenterology for management of his liver cirrhosis and related complications.  He will continue to need [facility] screening every 6 months with his gastroenterologist.  FOLLOW-UP in *** for labs and follow-up with Dr. Onesimo.  The total time spent in the appointment was *** minutes* .  All of the patient's questions were answered and the patient knows to call the clinic with any problems, questions, or concerns.  Emaline Onesimo MD MS AAHIVMS [facility] Hematology/Oncology Physician [facility]  *Total Encounter Time as defined by the Centers for Medicare and Medicaid Services includes, in addition to the face-to-face time of a patient visit (documented in the note above) non-face-to-face time: obtaining and reviewing outside history, ordering and reviewing medications, tests or procedures, care coordination (communications with other health care professionals or caregivers) and documentation in the medical record.  I,Emily Lagle,acting as a neurosurgeon for Emaline Onesimo, MD.,have documented all relevant documentation on the behalf of Emaline Onesimo, MD,as directed by  Emaline Onesimo, MD while in the presence of Emaline Onesimo, MD.  I have reviewed the above documentation for accuracy and completeness, and I agree with the above.  Kosisochukwu Burningham, MD

## 2024-10-23 ENCOUNTER — Encounter: Payer: Self-pay | Admitting: Radiology

## 2024-12-28 ENCOUNTER — Telehealth: Payer: Self-pay

## 2024-12-28 DIAGNOSIS — B182 Chronic viral hepatitis C: Secondary | ICD-10-CM

## 2024-12-28 NOTE — Telephone Encounter (Signed)
-----   Message from [facility] Mackinze Criado J sent at 08/04/2024  2:59 PM EDT ----- Contact patient to set up RUQ U/S for cirrhosis to be done in February.

## 2024-12-28 NOTE — Telephone Encounter (Signed)
 Francisco [name] informed that his appointment is set up for 01/26/2025 at [facility], arrive at 10:15AM for 10:30AM U/S. NPO midnight. I consulted with him prior to setting this up. He has the # if he needs to r/s.

## 2025-01-26 ENCOUNTER — Ambulatory Visit (HOSPITAL_COMMUNITY): Admission: RE | Admit: 2025-01-26 | Source: Ambulatory Visit

## 2025-01-26 DIAGNOSIS — B182 Chronic viral hepatitis C: Secondary | ICD-10-CM

## 2025-04-12 ENCOUNTER — Ambulatory Visit: Payer: Self-pay | Admitting: Orthopedic Surgery

## 2025-10-24 ENCOUNTER — Inpatient Hospital Stay: Admitting: Hematology

## 2025-10-24 ENCOUNTER — Inpatient Hospital Stay: Attending: Hematology
